# Patient Record
Sex: Female | Born: 1975 | Race: White | Hispanic: No | Smoking: Former smoker
Health system: Southern US, Community
[De-identification: ages and names within clinical notes are randomized; demographics above are authoritative.]

## PROBLEM LIST (undated history)

## (undated) DIAGNOSIS — R519 Headache, unspecified: Secondary | ICD-10-CM

## (undated) DIAGNOSIS — I10 Essential (primary) hypertension: Secondary | ICD-10-CM

## (undated) DIAGNOSIS — R5383 Other fatigue: Secondary | ICD-10-CM

## (undated) DIAGNOSIS — E559 Vitamin D deficiency, unspecified: Secondary | ICD-10-CM

## (undated) DIAGNOSIS — F319 Bipolar disorder, unspecified: Secondary | ICD-10-CM

## (undated) DIAGNOSIS — G4730 Sleep apnea, unspecified: Secondary | ICD-10-CM

## (undated) DIAGNOSIS — F32A Depression, unspecified: Secondary | ICD-10-CM

## (undated) DIAGNOSIS — M549 Dorsalgia, unspecified: Secondary | ICD-10-CM

## (undated) DIAGNOSIS — F329 Major depressive disorder, single episode, unspecified: Secondary | ICD-10-CM

## (undated) DIAGNOSIS — K5900 Constipation, unspecified: Secondary | ICD-10-CM

## (undated) DIAGNOSIS — D649 Anemia, unspecified: Secondary | ICD-10-CM

## (undated) DIAGNOSIS — E119 Type 2 diabetes mellitus without complications: Secondary | ICD-10-CM

## (undated) DIAGNOSIS — F4310 Post-traumatic stress disorder, unspecified: Secondary | ICD-10-CM

## (undated) DIAGNOSIS — K589 Irritable bowel syndrome without diarrhea: Secondary | ICD-10-CM

## (undated) DIAGNOSIS — M2550 Pain in unspecified joint: Secondary | ICD-10-CM

## (undated) DIAGNOSIS — M797 Fibromyalgia: Secondary | ICD-10-CM

## (undated) DIAGNOSIS — E282 Polycystic ovarian syndrome: Secondary | ICD-10-CM

## (undated) DIAGNOSIS — R0602 Shortness of breath: Secondary | ICD-10-CM

## (undated) DIAGNOSIS — M1990 Unspecified osteoarthritis, unspecified site: Secondary | ICD-10-CM

## (undated) DIAGNOSIS — F419 Anxiety disorder, unspecified: Secondary | ICD-10-CM

## (undated) DIAGNOSIS — K760 Fatty (change of) liver, not elsewhere classified: Secondary | ICD-10-CM

## (undated) DIAGNOSIS — F909 Attention-deficit hyperactivity disorder, unspecified type: Secondary | ICD-10-CM

## (undated) DIAGNOSIS — K219 Gastro-esophageal reflux disease without esophagitis: Secondary | ICD-10-CM

## (undated) DIAGNOSIS — R51 Headache: Secondary | ICD-10-CM

## (undated) HISTORY — DX: Anxiety disorder, unspecified: F41.9

## (undated) HISTORY — DX: Depression, unspecified: F32.A

## (undated) HISTORY — DX: Post-traumatic stress disorder, unspecified: F43.10

## (undated) HISTORY — DX: Polycystic ovarian syndrome: E28.2

## (undated) HISTORY — PX: WISDOM TOOTH EXTRACTION: SHX21

## (undated) HISTORY — DX: Vitamin D deficiency, unspecified: E55.9

## (undated) HISTORY — PX: CARPAL TUNNEL RELEASE: SHX101

## (undated) HISTORY — DX: Constipation, unspecified: K59.00

## (undated) HISTORY — DX: Attention-deficit hyperactivity disorder, unspecified type: F90.9

## (undated) HISTORY — DX: Headache, unspecified: R51.9

## (undated) HISTORY — DX: Major depressive disorder, single episode, unspecified: F32.9

## (undated) HISTORY — DX: Headache: R51

## (undated) HISTORY — DX: Irritable bowel syndrome, unspecified: K58.9

## (undated) HISTORY — PX: OTHER SURGICAL HISTORY: SHX169

## (undated) HISTORY — PX: ABDOMINAL HYSTERECTOMY: SHX81

## (undated) HISTORY — DX: Other fatigue: R53.83

## (undated) HISTORY — DX: Dorsalgia, unspecified: M54.9

## (undated) HISTORY — DX: Pain in unspecified joint: M25.50

## (undated) HISTORY — DX: Gastro-esophageal reflux disease without esophagitis: K21.9

## (undated) HISTORY — DX: Fatty (change of) liver, not elsewhere classified: K76.0

## (undated) HISTORY — DX: Shortness of breath: R06.02

## (undated) HISTORY — DX: Bipolar disorder, unspecified: F31.9

## (undated) HISTORY — PX: APPENDECTOMY: SHX54

## (undated) HISTORY — DX: Type 2 diabetes mellitus without complications: E11.9

---

## 2000-05-16 ENCOUNTER — Emergency Department (HOSPITAL_COMMUNITY): Admission: EM | Admit: 2000-05-16 | Discharge: 2000-05-16 | Payer: Self-pay | Admitting: Emergency Medicine

## 2000-05-24 ENCOUNTER — Inpatient Hospital Stay (HOSPITAL_COMMUNITY): Admission: AD | Admit: 2000-05-24 | Discharge: 2000-05-24 | Payer: Self-pay | Admitting: Obstetrics

## 2000-08-23 ENCOUNTER — Ambulatory Visit (HOSPITAL_COMMUNITY): Admission: RE | Admit: 2000-08-23 | Discharge: 2000-08-23 | Payer: Self-pay | Admitting: *Deleted

## 2000-08-23 ENCOUNTER — Encounter: Payer: Self-pay | Admitting: *Deleted

## 2000-08-27 ENCOUNTER — Inpatient Hospital Stay (HOSPITAL_COMMUNITY): Admission: AD | Admit: 2000-08-27 | Discharge: 2000-08-27 | Payer: Self-pay | Admitting: *Deleted

## 2000-11-12 ENCOUNTER — Inpatient Hospital Stay (HOSPITAL_COMMUNITY): Admission: AD | Admit: 2000-11-12 | Discharge: 2000-11-12 | Payer: Self-pay | Admitting: *Deleted

## 2000-12-23 ENCOUNTER — Encounter (INDEPENDENT_AMBULATORY_CARE_PROVIDER_SITE_OTHER): Payer: Self-pay | Admitting: Specialist

## 2000-12-23 ENCOUNTER — Inpatient Hospital Stay (HOSPITAL_COMMUNITY): Admission: AD | Admit: 2000-12-23 | Discharge: 2000-12-27 | Payer: Self-pay | Admitting: *Deleted

## 2001-10-11 ENCOUNTER — Encounter (INDEPENDENT_AMBULATORY_CARE_PROVIDER_SITE_OTHER): Payer: Self-pay | Admitting: Specialist

## 2001-10-11 ENCOUNTER — Ambulatory Visit (HOSPITAL_COMMUNITY): Admission: RE | Admit: 2001-10-11 | Discharge: 2001-10-11 | Payer: Self-pay

## 2001-10-14 ENCOUNTER — Inpatient Hospital Stay (HOSPITAL_COMMUNITY): Admission: AD | Admit: 2001-10-14 | Discharge: 2001-10-14 | Payer: Self-pay | Admitting: Obstetrics and Gynecology

## 2002-04-23 ENCOUNTER — Encounter: Payer: Self-pay | Admitting: Emergency Medicine

## 2002-04-23 ENCOUNTER — Emergency Department (HOSPITAL_COMMUNITY): Admission: EM | Admit: 2002-04-23 | Discharge: 2002-04-23 | Payer: Self-pay | Admitting: Emergency Medicine

## 2002-07-03 ENCOUNTER — Other Ambulatory Visit: Admission: RE | Admit: 2002-07-03 | Discharge: 2002-07-03 | Payer: Self-pay | Admitting: Obstetrics and Gynecology

## 2002-07-16 ENCOUNTER — Emergency Department (HOSPITAL_COMMUNITY): Admission: EM | Admit: 2002-07-16 | Discharge: 2002-07-16 | Payer: Self-pay | Admitting: *Deleted

## 2002-07-16 ENCOUNTER — Encounter: Payer: Self-pay | Admitting: *Deleted

## 2002-08-05 ENCOUNTER — Emergency Department (HOSPITAL_COMMUNITY): Admission: EM | Admit: 2002-08-05 | Discharge: 2002-08-05 | Payer: Self-pay | Admitting: Internal Medicine

## 2002-09-17 ENCOUNTER — Emergency Department (HOSPITAL_COMMUNITY): Admission: EM | Admit: 2002-09-17 | Discharge: 2002-09-17 | Payer: Self-pay

## 2002-09-29 ENCOUNTER — Emergency Department (HOSPITAL_COMMUNITY): Admission: EM | Admit: 2002-09-29 | Discharge: 2002-09-29 | Payer: Self-pay | Admitting: *Deleted

## 2003-12-26 ENCOUNTER — Ambulatory Visit (HOSPITAL_COMMUNITY): Admission: RE | Admit: 2003-12-26 | Discharge: 2003-12-26 | Payer: Self-pay | Admitting: Obstetrics and Gynecology

## 2004-10-27 ENCOUNTER — Emergency Department (HOSPITAL_COMMUNITY): Admission: EM | Admit: 2004-10-27 | Discharge: 2004-10-27 | Payer: Self-pay | Admitting: Emergency Medicine

## 2005-01-20 ENCOUNTER — Ambulatory Visit: Payer: Self-pay | Admitting: Orthopedic Surgery

## 2005-01-28 ENCOUNTER — Ambulatory Visit (HOSPITAL_COMMUNITY): Admission: RE | Admit: 2005-01-28 | Discharge: 2005-01-28 | Payer: Self-pay | Admitting: Orthopedic Surgery

## 2005-02-04 ENCOUNTER — Ambulatory Visit: Payer: Self-pay | Admitting: Orthopedic Surgery

## 2005-02-16 ENCOUNTER — Encounter: Admission: RE | Admit: 2005-02-16 | Discharge: 2005-02-16 | Payer: Self-pay | Admitting: Orthopedic Surgery

## 2005-02-17 ENCOUNTER — Emergency Department (HOSPITAL_COMMUNITY): Admission: EM | Admit: 2005-02-17 | Discharge: 2005-02-18 | Payer: Self-pay | Admitting: Emergency Medicine

## 2005-02-19 ENCOUNTER — Inpatient Hospital Stay (HOSPITAL_COMMUNITY): Admission: EM | Admit: 2005-02-19 | Discharge: 2005-02-23 | Payer: Self-pay | Admitting: Emergency Medicine

## 2005-03-03 ENCOUNTER — Ambulatory Visit: Payer: Self-pay | Admitting: Orthopedic Surgery

## 2005-03-29 ENCOUNTER — Ambulatory Visit: Payer: Self-pay | Admitting: Orthopedic Surgery

## 2005-04-26 ENCOUNTER — Ambulatory Visit: Payer: Self-pay | Admitting: Orthopedic Surgery

## 2005-04-30 ENCOUNTER — Ambulatory Visit (HOSPITAL_COMMUNITY): Admission: RE | Admit: 2005-04-30 | Discharge: 2005-04-30 | Payer: Self-pay | Admitting: Orthopedic Surgery

## 2005-04-30 ENCOUNTER — Ambulatory Visit: Payer: Self-pay | Admitting: Orthopedic Surgery

## 2005-05-03 ENCOUNTER — Ambulatory Visit: Payer: Self-pay | Admitting: Orthopedic Surgery

## 2005-05-05 ENCOUNTER — Ambulatory Visit (HOSPITAL_COMMUNITY): Admission: RE | Admit: 2005-05-05 | Discharge: 2005-05-05 | Payer: Self-pay | Admitting: Orthopedic Surgery

## 2005-05-10 ENCOUNTER — Ambulatory Visit: Payer: Self-pay | Admitting: Orthopedic Surgery

## 2005-05-31 ENCOUNTER — Ambulatory Visit: Payer: Self-pay | Admitting: Orthopedic Surgery

## 2005-07-01 ENCOUNTER — Ambulatory Visit: Payer: Self-pay | Admitting: Orthopedic Surgery

## 2005-09-17 ENCOUNTER — Ambulatory Visit (HOSPITAL_COMMUNITY): Admission: RE | Admit: 2005-09-17 | Discharge: 2005-09-17 | Payer: Self-pay | Admitting: Family Medicine

## 2005-09-23 ENCOUNTER — Other Ambulatory Visit: Admission: RE | Admit: 2005-09-23 | Discharge: 2005-09-23 | Payer: Self-pay | Admitting: Family Medicine

## 2005-10-11 ENCOUNTER — Ambulatory Visit: Payer: Self-pay | Admitting: Orthopedic Surgery

## 2005-12-07 ENCOUNTER — Ambulatory Visit: Payer: Self-pay | Admitting: Orthopedic Surgery

## 2005-12-07 ENCOUNTER — Encounter: Payer: Self-pay | Admitting: Orthopedic Surgery

## 2005-12-07 ENCOUNTER — Ambulatory Visit (HOSPITAL_COMMUNITY): Admission: RE | Admit: 2005-12-07 | Discharge: 2005-12-07 | Payer: Self-pay | Admitting: Orthopedic Surgery

## 2005-12-13 ENCOUNTER — Ambulatory Visit: Payer: Self-pay | Admitting: Orthopedic Surgery

## 2005-12-22 ENCOUNTER — Ambulatory Visit: Payer: Self-pay | Admitting: Orthopedic Surgery

## 2006-01-12 ENCOUNTER — Ambulatory Visit: Payer: Self-pay | Admitting: Orthopedic Surgery

## 2006-03-23 ENCOUNTER — Emergency Department (HOSPITAL_COMMUNITY): Admission: EM | Admit: 2006-03-23 | Discharge: 2006-03-23 | Payer: Self-pay | Admitting: Emergency Medicine

## 2006-11-24 ENCOUNTER — Ambulatory Visit (HOSPITAL_COMMUNITY): Admission: RE | Admit: 2006-11-24 | Discharge: 2006-11-24 | Payer: Self-pay | Admitting: Obstetrics

## 2006-12-25 ENCOUNTER — Emergency Department (HOSPITAL_COMMUNITY): Admission: EM | Admit: 2006-12-25 | Discharge: 2006-12-25 | Payer: Self-pay | Admitting: Emergency Medicine

## 2007-04-27 ENCOUNTER — Ambulatory Visit: Payer: Self-pay | Admitting: Orthopedic Surgery

## 2008-01-02 ENCOUNTER — Emergency Department (HOSPITAL_COMMUNITY): Admission: EM | Admit: 2008-01-02 | Discharge: 2008-01-02 | Payer: Self-pay | Admitting: Emergency Medicine

## 2008-01-09 ENCOUNTER — Emergency Department (HOSPITAL_COMMUNITY): Admission: EM | Admit: 2008-01-09 | Discharge: 2008-01-09 | Payer: Self-pay | Admitting: Emergency Medicine

## 2008-02-01 ENCOUNTER — Ambulatory Visit (HOSPITAL_COMMUNITY): Admission: RE | Admit: 2008-02-01 | Discharge: 2008-02-01 | Payer: Self-pay | Admitting: Obstetrics and Gynecology

## 2008-02-26 ENCOUNTER — Encounter: Payer: Self-pay | Admitting: Obstetrics and Gynecology

## 2008-02-26 ENCOUNTER — Inpatient Hospital Stay (HOSPITAL_COMMUNITY): Admission: RE | Admit: 2008-02-26 | Discharge: 2008-02-28 | Payer: Self-pay | Admitting: Obstetrics and Gynecology

## 2008-03-02 ENCOUNTER — Emergency Department (HOSPITAL_COMMUNITY): Admission: EM | Admit: 2008-03-02 | Discharge: 2008-03-02 | Payer: Self-pay | Admitting: Emergency Medicine

## 2008-03-03 ENCOUNTER — Emergency Department (HOSPITAL_COMMUNITY): Admission: EM | Admit: 2008-03-03 | Discharge: 2008-03-03 | Payer: Self-pay | Admitting: Emergency Medicine

## 2008-06-09 ENCOUNTER — Emergency Department (HOSPITAL_COMMUNITY): Admission: EM | Admit: 2008-06-09 | Discharge: 2008-06-09 | Payer: Self-pay | Admitting: Emergency Medicine

## 2008-09-12 ENCOUNTER — Ambulatory Visit (HOSPITAL_COMMUNITY): Admission: RE | Admit: 2008-09-12 | Discharge: 2008-09-12 | Payer: Self-pay | Admitting: Family Medicine

## 2008-11-13 ENCOUNTER — Ambulatory Visit (HOSPITAL_COMMUNITY): Admission: RE | Admit: 2008-11-13 | Discharge: 2008-11-13 | Payer: Self-pay | Admitting: Neurology

## 2009-02-22 ENCOUNTER — Encounter: Payer: Self-pay | Admitting: Orthopedic Surgery

## 2009-02-22 ENCOUNTER — Emergency Department (HOSPITAL_COMMUNITY): Admission: EM | Admit: 2009-02-22 | Discharge: 2009-02-22 | Payer: Self-pay | Admitting: Emergency Medicine

## 2009-03-03 ENCOUNTER — Ambulatory Visit: Payer: Self-pay | Admitting: Orthopedic Surgery

## 2009-03-03 DIAGNOSIS — M25569 Pain in unspecified knee: Secondary | ICD-10-CM | POA: Insufficient documentation

## 2009-03-05 ENCOUNTER — Encounter: Payer: Self-pay | Admitting: Orthopedic Surgery

## 2009-06-06 ENCOUNTER — Emergency Department (HOSPITAL_COMMUNITY): Admission: EM | Admit: 2009-06-06 | Discharge: 2009-06-06 | Payer: Self-pay | Admitting: Emergency Medicine

## 2009-10-08 ENCOUNTER — Emergency Department (HOSPITAL_COMMUNITY): Admission: EM | Admit: 2009-10-08 | Discharge: 2009-10-08 | Payer: Self-pay | Admitting: Emergency Medicine

## 2009-10-10 ENCOUNTER — Emergency Department (HOSPITAL_COMMUNITY): Admission: EM | Admit: 2009-10-10 | Discharge: 2009-10-10 | Payer: Self-pay | Admitting: Emergency Medicine

## 2009-10-15 ENCOUNTER — Emergency Department (HOSPITAL_COMMUNITY): Admission: EM | Admit: 2009-10-15 | Discharge: 2009-10-15 | Payer: Self-pay | Admitting: Emergency Medicine

## 2009-12-18 ENCOUNTER — Encounter: Payer: Self-pay | Admitting: Orthopedic Surgery

## 2010-04-04 ENCOUNTER — Emergency Department (HOSPITAL_COMMUNITY): Admission: EM | Admit: 2010-04-04 | Discharge: 2010-04-04 | Payer: Self-pay | Admitting: Emergency Medicine

## 2010-08-12 ENCOUNTER — Emergency Department (HOSPITAL_COMMUNITY): Admission: EM | Admit: 2010-08-12 | Discharge: 2010-08-12 | Payer: Self-pay | Admitting: Emergency Medicine

## 2010-11-29 ENCOUNTER — Encounter: Payer: Self-pay | Admitting: Orthopedic Surgery

## 2010-12-10 NOTE — Letter (Signed)
Summary: *Orthopedic No Show Letter  [facility]  [address]  [place], [place] [address]   Phone: [phone]  Fax: [phone]    12/18/2009   Roger Fas[address]acht [address] [place], [place]  [address]    Dear Ms. Joselyn Glassman,   Our records indicate that you missed your scheduled appointment with Dr. Beaulah Corin on 12/11/2009.  Please contact this office to reschedule your appointment as soon as possible.  It is important that you keep your scheduled appointments with your physician, so we can provide you the best care possible.  We have enclosed an appointment card for your convenience.      Sincerely,    Dr. Terrance Mass, MD [facility] Phone [phone]

## 2011-02-09 LAB — DIFFERENTIAL
Basophils Absolute: 0 10*3/uL (ref 0.0–0.1)
Basophils Relative: 0 % (ref 0–1)
Eosinophils Absolute: 0.7 10*3/uL (ref 0.0–0.7)
Lymphs Abs: 3.5 10*3/uL (ref 0.7–4.0)
Neutrophils Relative %: 36 % — ABNORMAL LOW (ref 43–77)

## 2011-02-09 LAB — BASIC METABOLIC PANEL
BUN: 9 mg/dL (ref 6–23)
Chloride: 107 mEq/L (ref 96–112)
Creatinine, Ser: 0.8 mg/dL (ref 0.4–1.2)

## 2011-02-09 LAB — CBC
MCHC: 34.5 g/dL (ref 30.0–36.0)
MCV: 87.8 fL (ref 78.0–100.0)
Platelets: 170 10*3/uL (ref 150–400)
RDW: 13.4 % (ref 11.5–15.5)
WBC: 7.7 10*3/uL (ref 4.0–10.5)

## 2011-02-09 LAB — URINALYSIS, ROUTINE W REFLEX MICROSCOPIC
Ketones, ur: NEGATIVE mg/dL
Leukocytes, UA: NEGATIVE
Nitrite: NEGATIVE

## 2011-02-09 LAB — URINE MICROSCOPIC-ADD ON

## 2011-02-22 LAB — CSF CELL COUNT WITH DIFFERENTIAL: WBC, CSF: 6 /mm3 — ABNORMAL HIGH (ref 0–5)

## 2011-02-22 LAB — PATHOLOGIST SMEAR REVIEW

## 2011-03-23 NOTE — Op Note (Signed)
NAME:  Robin Bender, Robin Bender NO.:  [id number]   MEDICAL RECORD NO.:  [id number]          PATIENT TYPE:  INP   LOCATION:  A321                          FACILITY:  [facility]   PHYSICIAN:  Tilda Burrow, M.D. DATE OF BIRTH:  1976/01/09   DATE OF PROCEDURE:  02/26/2008  DATE OF DISCHARGE:                               OPERATIVE REPORT   PREOPERATIVE DIAGNOSES:  1. Abdominal wall endometriosis (mass).  2. Dysmenorrhea.  3. Pelvic pain.   POSTOPERATIVE DIAGNOSES:  1. Abdominal wall endometriosis (mass).  2. Dysmenorrhea.  3. Pelvic pain.   PROCEDURES:  1..  Abdominal hysterectomy with bilateral salpingo-  oophorectomy.  1. Excision of fibrotic area and anterior abdominal wall (rectus      muscle).   SURGEON:  Tilda Burrow, M.D.   ASSISTANT:  Morrie Sheldon R.N.   ANESTHESIA:  General.   COMPLICATIONS:  None.   FINDINGS:  Dense fibrotic tissue from fascia to the surface of the right  rectus muscle and into the midline, approximately 4 cm in maximum  diameter involving the muscle itself to some degree and the midline  structures, felt to represent old endometriosis.  Anteflexed,  upper  limits, normal-sized uterus, and had diffuse vascularity throughout the  pelvis.  Bladder densely adherent to old C-section scar.   DETAILS OF PROCEDURE:  The patient was taken to the operating room,  prepped and draped in the usual fashion for a lower abdominal surgery.  The Pfannenstiel incision was repeated.  The dense fibrotic area in  question was somewhat cephalad to the incision, and so it was felt  prudent to remove a portion of the subcu fatty tissue and skin overlying  the upper portion of the old scar in order to improve access to the  fibrotic area.  An approximately 10-cm wide x 40-cm long ellipse of skin  and fatty tissue was removed.  A Pfannenstiel incision was then made  cutting down over the firm fibrotic tissue with a semicircular contour  to the fascial incision  allowing the fascia to be flapped upward.  The  dense fibrotic tissue could be identified.  The midline was identified  above the area of fibrosis, and the peritoneal cavity entered higher  than the fibrotic area and, then with the operator's left index finger  on the anterior abdominal wall, we were able to identify the limits of  the firm fibrotic area.  Fibrosis extended well into the rectus muscles  on the right side and required a shaving procedure to core out  approximately 4-cm wide x 5-cm vertical x 2-cm deep area of fibrotic  tissue getting down to relatively normal tissue surrounding it.  Some of  this tissue was left on the right rectus muscles during the entry  process.  Bladder flap was found to be well out of the way.  There were  some omental attachments to the backside of the midline where the  fibrosis was most attached to the midline.  This could be sharply  dissected free and the omentum checked and confirmed as hemostatic.  Bladder flap was well away  from the inferior aspects of the fibrosis.  Balfour retractor could be positioned, lateral retraction performed, and  the uterus inspected.  Two laparotomy tapes were placed in the abdomen,  moistened, and used to expose the pelvis by elevating abdominal  contents.  The uterus could be grasped with Lahey thyroid tenaculum at  its fundal area, and round ligaments taken down, clamped, cut, and  suture ligated bilaterally.  The bladder flap was very high on the lower  uterine segment and densely adherent.  There was some redundancy that  was loosely adherent even further upon the uterus.  This portion was  taken down and the dense adhesions worked around carefully.  We first  took down the utero-ovarian ligaments, fallopian tube and as a pedicle  clamping, cutting, and suture ligating.  Uterine vessels were then  clamped, cut, and suture ligated using curved Heaney clamps, Kelly  clamps for back bleeding, and #0 chromic suture  ligature.  At this time,  we were able to work around and develop a bladder flap using a right-  angle clamp to dissect below the most fibrotic area on the anterior  uterine segment and then using a combination of Bovie cautery and  Metzenbaum scissors to cut the bladder dome free from the uterus being  careful to cut in the surface of the uterus rather than risk bladder  disruption.  The bladder was inspected at the completion of the surgery  and there was no suspicion for penetration of the bladder.   The procedure then continued with clamping and cutting the upper  cardinal ligaments with straight Heaney clamps, knife dissection, and #0  chromic suture ligature.  The uterus was then amputated off the lower  uterine segment.  We then continued to march down the lower cardinal  ligaments reaching the level of the cervix.  A stab incision could be  made in the anterior cervical vaginal fornix.  The first attempt was  through the cervix itself, and the second attempt allowed [place] to find the  cervical vaginal angle, and amputate the cervix off the very vascular  cuff.  Kocher clamps were placed at 6 spots along the cuff for  hemostasis and orientation.  Lateral sutures using Aldridge stitches  were placed at each lateral vaginal angle.  The remainder of the cuff  was closed with a series of figure-of-eight sutures of #0 chromic and  required several sutures in order to achieve adequate hemostasis.  Pelvis was irrigated and found adequately hemostatic.  The EBL at this  time was approximately 400 mL.   Attention was then directed to the bladder flap, inspected, and  hemostasis again confirmed.  Removal of tubes and ovaries was then  performed.  The left side was performed first, and the very mobile tube  and ovary could be placed on traction, cross-clamped just beneath the  ovary, and specimen removed without difficulty, ureter was well out of  harm's way.  On the right side, the ureter  could easily be seen  peristalsing beneath the lateral sidewall far away from the surgical  field, and then cross-clamping was performed, and a good results  obtained with removal of the tube and ovary suturing of the pedicle.  As  we further irrigated and inspected, the suture on this pedicle was  partially dislodged and required right-angle clamping across the  pedicle.  Again, the ureter was confirmed as being well out of harm's  way, and then a suture ligation performed once again on  this pedicle  with adequate hemostasis.  Pelvis was irrigated again.  Hemostasis  considered adequate and attention directed to closure.   Laparotomy equipment was removed.  Sponge and needle counts were  correct.  Peritoneal surface was closed with a running 2-0 chromic.  The  right rectus muscle required excision of the last thumb-sized portion of  fibrotic tissue from the anterior rectus muscle wall, and then the  rectus muscles were loosely reapproximated to the midline using loose  sutures of 2-0 chromic.  Fascia was closed using 2 segments of #0 Vicryl  to pull the fascia back into place.  The subcu fatty tissue was  recontoured sufficiently to allow tissue edge approximation.  The  inferior margins were loosened and the extra fatty tissue on the upper  side trimmed to allow for good skin edge approximation.  Subcu fatty  tissue was irrigated copiously.  It should be noted that prior to  closure of the fascia, a midline subfascial drain was placed and allowed  to exit through a suprapubic stab incision site.   Similarly, once the subcu fatty tissues had been reapproximated using 2  layers of interrupted 2-0 plain sutures, we were then able to place a  subcutaneous flat 10-mm Jackson-Pratt drain, which was allowed to exit  through the left corner of the incision.  Staples closed the skin.  Hemostasis at this time was good with minimal bleeding from the drains  at this time with completion of the  procedure.  The patient's urine  output during the case had been relatively low at 100 mL, but picked up  at the completion of the case.  Again, at no time were we suspicious for  bladder disruption, and ureters were well out of the area where the  tubes and ovaries were removed.  The patient went to recovery room in  stable condition with sponge and needle counts correct with relatively  impressive discomfort levels, which will be treated with a Dilaudid PCA  pump.      Tilda Burrow, M.D.  Electronically Signed     JVF/MEDQ  D:  02/26/2008  T:  02/27/2008  Job:  [id number]

## 2011-03-23 NOTE — H&P (Signed)
NAME:  Robin Bender, ARRAS NO.:  [id number]   MEDICAL RECORD NO.:  [id number]          PATIENT TYPE:  AMB   LOCATION:  DAY                           FACILITY:  [facility]   PHYSICIAN:  Tilda Burrow, M.D. DATE OF BIRTH:  10-17-1976   DATE OF ADMISSION:  DATE OF DISCHARGE:  LH                              HISTORY & PHYSICAL   ADMITTING DIAGNOSIS:  Endometriosis of abdominal wall, dysmenorrhea,  schedule for a hysterectomy with removal of tubes and ovaries, and  excision of abdominally wall nodular thickening.   HISTORY OF PRESENT ILLNESS:  This 35 year old female status post C-  section x2, status post tubal ligation, and status post removal of  ovarian cyst, is admitted at this time for abdominal hysterectomy with  removal of tubes and ovaries.  Her most recent history has been notable  for progressive increase in abdominal pain essentially with each menses  where she hurts severely just above her old C-section scar.  She has  been seen for this in the emergency room .  Evaluation there describes  the pain as in the mid and lower abdomen.  CT of the abdomen has been  performed.  There is an unremarkable appearance to the tubes and  ovaries.  There is a normal appendix.  There are no intrapelvic masses  or adenopathy.  There is unfortunately nodular thickening of the  inferior rectus sheath 5 cm below the umbilicus, which is in the area  where her pain complaints predominate.  These pains are associated with  each menses, pain with walking.  She is bedridden with her periods due  to the severity of the pain, especially the belly wall.  The pain does  extend into the hips and into the back raising suspension that there is  endometriosis in the abdominal cavity as well.  She has no fever.  The  pain is associated with any movement.  She is a gravida 2, para 2,  status post tubal ligation, Cesarean section x2 and ovarian cyst  removal.   She sees no point in trying to do  conservative surgery to preserve the  uterus as she has no plans to use it.  She is in a stable marital  relationship and partner is with her for this discussion.   Plans are to proceed with abdominal hysterectomy with exploration of the  facial layer at the level of the rectus muscles, try to remove any  fibrotic tissue identified.   PHYSICAL EXAMINATION:  Height 5 feet 6 inches.  Weight 225 pounds.  Blood pressure 138/82.  Pupils equal, rebound, reactive.  Extraocular movements intact.  NECK:  Supple.  CHEST:  Clear.  BREAST:  Exam not performed.  ABDOMEN:  Shows well healed lower abdominal Pfannenstiel  incision with  thickening and hard tissue above it, which is moderately tender to  palpation.  External genitalia is normal.  Pap smear is reported by  patient as normal from previous physician.  Uterus is mobile.  Adnexa is  without masses.   IMPRESSION:  Endometriosis of abdominal wall, dysmenorrhea.   PLAN:  Abdominal hysterectomy with removal of tubes and ovaries on February 26, 2008.      Tilda Burrow, M.D.  Electronically Signed     JVF/MEDQ  D:  02/22/2008  T:  02/22/2008  Job:  [id number]

## 2011-03-26 NOTE — Consult Note (Signed)
NAME:  Robin Bender, Robin Bender             ACCOUNT NO.:  [id number]   MEDICAL RECORD NO.:  [id number]          PATIENT TYPE:  INP   LOCATION:  A323                          FACILITY:  [facility]   PHYSICIAN:  Kofi A. Gerilyn Pilgrim, M.D. DATE OF BIRTH:  [birth year]   DATE OF CONSULTATION:  02/22/2005  DATE OF DISCHARGE:                                   CONSULTATION   IMPRESSION:  The patient's clinical headache syndrome is most consistent  with a low-pressure post lumbar tap headache.   RECOMMENDATIONS:  1.  Continue with conservative treatment as is being done at this time.  2.  Caffeine could be very effective, and, therefore, I would suggest using      Fioricet in the daytime p.r.n.  3.  I would avoid epidural injection at the cervical spine level as the risk      of complications is greater.  4.  Continue lots of fluids.  5.  IV DHE can also be efficacious for this type of low-pressure headaches      sometimes.   HISTORY:  This is a 35 year old Caucasian female who has history of neck  pain due to bulging disk.  The patient is being seen by Dr. Fuller Canada  and referred to Dr. Barrington Ellison for an interlaminar epidural injection at C7-T1.  The procedure was aborted after a tap was obtained indicating penetration of  subarachnoid space.  The patient apparently developed pain in the hand  during the procedure, but this abruptly resolved once the needle was  removed.  Apparently no vascular uptake was noted under fluoroscopy.  The  patient was discharged home and apparently did well.  However, at home she  sat up and noted she had a severe headache in the posterior neck and head  region.  No fever was reported.  The patient does not have a baseline  history of headache.   She was admitted to the hospital for evaluation.  Images of the cervical  spine and neck with CT have been unremarkable.  The patient has been given  medications which apparently seem to be helping, and headaches have improved  significantly, although she still gets a mild headache on sitting up or  standing.  She is currently getting Dilaudid p.r.n.   PAST MEDICAL HISTORY:  Mostly unremarkable.  She has history of obesity.  She has had two cesarean sections,  tubal ligation, and ovarian cyst removal  in 2003.   FAMILY HISTORY:  Significant for heart problems.   SOCIAL HISTORY:  She lives with her boyfriend and two children.  She smokes  one-half pack of cigarettes a day and reports no alcohol or illicit drug  use.  She currently does not work.   PHYSICAL EXAMINATION:  GENERAL:  Obese lady in no acute distress.  VITAL SIGNS:  Temperature 98.1, pulse 71, respirations 20, blood pressure  142/83.  NECK:  Supple.  MENTATION: She is awake, alert.  She converses well. Speech, language,  cognition are intact.  CRANIAL NERVE: Pupils are 5 mm and briskly reactive.  Extraocular movements  are intact.  Visual fields were intact.  Funduscopic examination shows flat  disks with spontaneous venous pulsations noted.  Facial muscles are  symmetric.  Tongue is midline.  Uvula is midline.  Shoulder shrugs were  normal.  MOTOR EXAMINATION:  Normal tone, bulk, and strength.  There is no pronator  drift.  Coordination intact.  Reflexes are +2.  Plantar reflexes are both  downgoing.  Sensory examination normal to light touch.  Gait is normal.   LABORATORY DATA AND OTHER STUDIES:  CT scan of the brain is normal.   CT scan of the spine shows cervical spine also normal.      KAD/MEDQ  D:  02/22/2005  T:  02/22/2005  Job:  [id number]

## 2011-03-26 NOTE — H&P (Signed)
NAME:  Robin Bender, Robin Bender NO.:  [id number]   MEDICAL RECORD NO.:  [id number]          PATIENT TYPE:  AMB   LOCATION:  DAY                           FACILITY:  [facility]   PHYSICIAN:  Vickki Hearing, M.D.DATE OF BIRTH:  1976/07/19   DATE OF ADMISSION:  DATE OF DISCHARGE:  LH                                HISTORY & PHYSICAL   CHIEF COMPLAINT:  Pain and paresthesias of the left upper extremity.   HISTORY:  This is a 35 year old female presenting with neck and upper  extremity pain and paresthesias for approximately 2 months, no injury, no  previous symptoms of such.  She is noted to have cervical disk problems, had  epidural injection with development of headache and had to be admitted.  She  has a posterior disk bulging at C5-6 and a posterior disk bulging at C6-7.  She also has documented carpal tunnel syndrome, left greater than right, and  wishes to have left carpal tunnel release after failure of nonoperative  treatment.   REVIEW OF SYSTEMS:  History of left knee pain and swelling.  MRI was  obtained to evaluate that as well.   That study is pending.   EXAMINATION:  Normal development, grooming and hygiene.  She is alert and  oriented x3.  Has decreased sensation in the carpal tunnel distribution.  Her pulses are intact and normal.  Skin no lesions.  Musculoskeletal exam  shows positive compression test over the carpal tunnel.  She also has a  positive Phalen's test.  She has decreased sensation in the median  distribution on both upper extremities, left greater than right.  Chest is  clear.  Heart rate and rhythm are normal.  Abdomen is soft.   DATA:  Data include a nerve conduction study indicating carpal tunnel  syndrome.   ASSESSMENT AND PLAN:  The plan is for a carpal tunnel release for left  carpal tunnel syndrome.       SEH/MEDQ  D:  04/29/2005  T:  04/29/2005  Job:  [id number]

## 2011-03-26 NOTE — H&P (Signed)
NAME:  Robin Bender, PRINCIPATO NO.:  [id number]   MEDICAL RECORD NO.:  [id number]          PATIENT TYPE:  AMB   LOCATION:                                FACILITY:  [facility]   PHYSICIAN:  Vickki Hearing, M.D.DATE OF BIRTH:  1976-10-23   DATE OF ADMISSION:  DATE OF DISCHARGE:  LH                                HISTORY & PHYSICAL   CHIEF COMPLAINT:  Pain and paresthesias of the right upper extremity.   HISTORY:  This 35 year old female status post left carpal tunnel release  presents now for right carpal tunnel release.  She does have a posterior  disk bulge at C5-C6 and C6-C7 as documented carpal tunnel syndrome.  She  presents for a right carpal tunnel release after failure of nonoperative  treatment.   The patient understands risks and benefits of the procedure including no  surgery.   REVIEW OF SYSTEMS:  History of left knee pain and swelling; MRI was done to  evaluate that.  She was found to have a small joint effusion and a popliteal  cyst.  No meniscal tears or ligament damage.   EXAMINATION:  Normal development, grooming, and hygiene.  She is alert and  oriented x3.  She has decreased sensation in the right median nerve  distribution.  No skin lesions.  She has a positive compression test over  the right carpal tunnel with a positive Phalen's test.  She has clear chest.  Heart rate and rhythm are normal.  Abdomen is soft.  General appearance was  normal.   DATA:  Positive carpal tunnel nerve conduction study.   PLAN:  Open right carpal tunnel release.      Vickki Hearing, M.D.  Electronically Signed     SEH/MEDQ  D:  12/06/2005  T:  12/06/2005  Job:  [id number]   cc:   [facility]  Fax: [phone]

## 2011-03-26 NOTE — Op Note (Signed)
NAME:  Robin Bender, Robin Bender NO.:  [id number]   MEDICAL RECORD NO.:  [id number]          PATIENT TYPE:  AMB   LOCATION:  DAY                           FACILITY:  [facility]   PHYSICIAN:  Vickki Hearing, M.D.DATE OF BIRTH:  Sep 30, 1976   DATE OF PROCEDURE:  12/07/2005  DATE OF DISCHARGE:                                 OPERATIVE REPORT   This patient had pain and paresthesias of the right upper extremity, had a  nerve conduction study which confirmed clinical diagnosis of carpal tunnel  syndrome, presented for carpal tunnel release of the right upper extremity.   PREOPERATIVE DIAGNOSIS:  Carpal tunnel syndrome, right wrist.   POSTOPERATIVE DIAGNOSIS:  Carpal tunnel syndrome, right wrist.   PROCEDURE:  Open right carpal tunnel release.   FINDINGS:  Compression of median nerve.   SURGEON:  Dr. Romeo Apple.   ANESTHETIC:  Bier block.   The patient's chart was checked prior to surgery. Her right wrist was  countersigned as the surgical site, and the history and physical was  updated. She was given antibiotic, taken to the operating room for Bier  block. After this was established, time-out was taken and completed.   A straight incision was made over the carpal tunnel. Subcutaneous tissue was  divided. Distal aspect of the carpal tunnel was identified. Blunt dissection  was carried out beneath the transverse carpal ligament. The contents of the  carpal tunnel were inspected. There were no other lesions in the carpal  tunnel. Median nerve looked compressed and had an hourglass configuration.   The wound was irrigated and closed with 3-0 nylon suture. We did inject a  combination of 1% plain Marcaine and 1/2% plain Sensorcaine. Sterile  dressing was applied. Tourniquet was released and had good capillary refill.  The patient was taken to the recovery room in stable condition. She was  discharged with 90 Tylox.      Vickki Hearing, M.D.  Electronically  Signed     SEH/MEDQ  D:  12/07/2005  T:  12/07/2005  Job:  [id number]

## 2011-03-26 NOTE — H&P (Signed)
NAME:  Robin Bender, Robin Bender NO.:  [id number]   MEDICAL RECORD NO.:  [id number]                   PATIENT TYPE:  AMB   LOCATION:  DAY                                  FACILITY:  [facility]   PHYSICIAN:  Tilda Burrow, M.D.              DATE OF BIRTH:  03/22/1976   DATE OF ADMISSION:  12/26/2003  DATE OF DISCHARGE:                                HISTORY & PHYSICAL   The patient is scheduled for surgery in [facility] on  December 26, 2003.   HISTORY OF PRESENT ILLNESS:  This 35 year old female, gravida 2, para 2, C  section x 2, last menstrual period December 10, 2003, was admitted at time  for elective permanent sterilization.  She has been seen in our office and  requests permanent sterilization.  She signed appropriate Medicaid  sterilization forms on November 19, 2003.  She understands the permanency of  the requested procedure.  [name] instructional booklet was given to the  patient on November 19, 2003, explaining in detail specific review of Falope  ring application.  Questions encouraged and explained to patient's  satisfaction.  She returns for preoperative assessment and confirmation for  continued desire for procedure on December 25, 2003, with unwavering desire  for sterilization.   PAST MEDICAL HISTORY:  Benign.   PAST SURGICAL HISTORY:  1. Ovarian cystectomy August 11, 2001.  2. Cesarean section x 2.   ALLERGIES:  None known.   PHYSICAL EXAMINATION:  VITAL SIGNS:  Height 5 feet 4 inches.  Weight 230.  Blood pressure 122/70.  GENERAL:  Overweight Caucasian female.  Alert and oriented x 3.  HEENT:  Pupils equal, round, and reactive.  Extraocular movements intact.  NECK:  Supple. Trachea midline.  CHEST:  Clear to auscultation.  ABDOMEN:  Nontender, obese. Well-healed Pfannenstiel incision from three  prior surgeries.  PELVIC:  External genitalia normal.  Vaginal exam: Normal secretions.  Nonpurulent cervix.  Infection from  the bacterial vaginosis noted in January  has resolved.  Uterus nontender.  Adnexa negative for masses.  EXTREMITIES:  Grossly normal.   IMPRESSION:  Elective sterilization.   PLAN:  Laparoscopic tubal sterilization with Falope rings December 26, 2003.     ___________________________________________                                         Tilda Burrow, M.D.   JVF/MEDQ  D:  12/25/2003  T:  12/25/2003  Job:  [id number]

## 2011-03-26 NOTE — H&P (Signed)
[facility]  Patient:    QUETZALY, EBNER                    MRN: [id number] Adm. Date:  [id number] Attending:  Deniece Ree                         History and Physical  HISTORY:                      The patient is a 35 year old primigravida, with EDC of December 25, 2000, was admitted in early labor on 10:50 p.m. on December 23, 2000.  The cervix was 2.0 cm, 70%, with a vertex at -1 to -2. Negative group-B Streptococcus.  Estimated fetal weight was 8 pounds.  The patient was started on Pitocin stimulation, and at 3:25 a.m. on December 24, 2000, an amniotomy was performed.  The fluid was clear.  The tracing was reactive.  Cervix was 2.0 cm, 70%, vertex -1.  She was started on Pitocin, and by 7 a.m. she was contracting regularly every two minutes, of good quality. By 11:40 a.m. she was 4.0 cm dilated, 100%, vertex -1 to -2.  The patient was contracting with quality every two minutes, up to 26 mU of Pitocin.  She made no progress over the next four hours, and it was decided for a C-section, with failure to progress.  PHYSICAL EXAMINATION:  GENERAL:                      A well-developed female in labor.  HEENT:                        Negative.  LUNGS:                        Clear.  HEART:                        A regular rhythm.  No murmurs or gallops.  ABDOMEN:                      Term-sized uterus.  Estimated fetal weight between 8-9 pounds.  PELVIC:                       As described above.  EXTREMITIES:                  Negative. DD:  12/24/00 TD:  12/24/00 Job: [id number]

## 2011-03-26 NOTE — Discharge Summary (Signed)
NAME:  Robin Bender, Robin Bender NO.:  [id number]   MEDICAL RECORD NO.:  [id number]          PATIENT TYPE:  INP   LOCATION:  A323                          FACILITY:  [facility]   PHYSICIAN:  Calvert Cantor, M.D.     DATE OF BIRTH:  [birth year]   DATE OF ADMISSION:  02/19/2005  DATE OF DISCHARGE:  04/18/2006LH                                 DISCHARGE SUMMARY   A patient of [facility].   DISCHARGE DIAGNOSES:  1.  Spinal headache.  2.  Disk bulging at C5-6, C6-7.   DISCHARGE MEDICATIONS:  1.  Fioricet 1-2 tabs q.4h.  2.  Dilaudid 4 mg 1 tab q.4-6h. p.r.n.  3.  Nicotine patch 14 mg daily x14 days, followed by 7 mg daily x14 days.   DISCHARGE INSTRUCTIONS:  1.  Strict bedrest.  2.  Do not smoke while using the patch.  3.  Please insure that you have adequate hydration, up to 8-10 glasses of      water per day.   HOSPITAL COURSE:  This is a 35 year old white female who was admitted status  post an epidural injection in the cervical region for neck pain secondary to  disk bulging.  At that time, the subarachnoid space was penetrated, and the  procedure was then discontinued.  Subsequently, the patient developed a  spinal headache and was admitted to the hospital.  She had a neurology  followup with Dr. Gerilyn Pilgrim for a possible blood patch; however, it has been  decided that she will be discharged home on medications.  While she was in  the hospital, she continued to go downstairs to smoke cigarettes; therefore,  her headache worsened each time she stood or sat up.  It had been explained  to her that she needed strict bedrest and adequate hydration.  She will be  on the above medications for at least 1-2 weeks.  I received a call from her  neurologist, Dr. Barrington Ellison, yesterday, who also agrees that a blood patch is  not necessary, as her epidural space is very narrow; therefore, she will  just receive conservative treatment for now.  She is to follow up with  her  primary care physician if she sees no improvement within a week or two.     SR/MEDQ  D:  02/23/2005  T:  02/23/2005  Job:  [id number]   cc:   [facility]

## 2011-03-26 NOTE — Op Note (Signed)
NAME:  Robin Bender, Robin Bender NO.:  [id number]   MEDICAL RECORD NO.:  [id number]                   PATIENT TYPE:  AMB   LOCATION:  DAY                                  FACILITY:  [facility]   PHYSICIAN:  Tilda Burrow, M.D.              DATE OF BIRTH:  [birth year]   DATE OF PROCEDURE:  12/31/2003  DATE OF DISCHARGE:  12/26/2003                                 OPERATIVE REPORT   PREOPERATIVE DIAGNOSIS:  Elective sterilization.   POSTOPERATIVE DIAGNOSIS:  Elective sterilization.   PROCEDURE:  Laparoscopic tubal sterilization, Falope rings.   SURGEON:  Tilda Burrow, M.D.   ASSISTANT:  None.   ANESTHESIA:  General.   COMPLICATIONS:  None.   ESTIMATED BLOOD LOSS:  Minimal.   FINDINGS:  Anterior omental adhesions to the anterior abdominal wall.   DETAILS OF PROCEDURE:  The patient was taken to the operating room and  prepped and draped in the usual fashion for a combined abdominal and vaginal  procedure with legs in yellow-fin, low lithotomy support. An infraumbilical,  vertical, 1-cm skin incision was made as well as a transverse suprapubic  incision.  The infraumbilical incision was used to access the pelvis using a  Veress needle and water droplet confirmation technique while orienting the  needle towards the pelvis.  Pneumoperitoneum was easily achieved under  normal pressure; and the laparoscopic 5-mm trocar used, under direct  visualization, to enter the abdominal cavity.   The blunt trocar technique was used.  Inspection of the anterior abdominal  wall revealed some anterior omental adhesions which did not prevent access  to the pelvis and did not cause any bowel to be suspicious for injury  related to needle insertion.  We were able to see past it and placed a  suprapubic trocar under direct visualization being careful to stay out of  harms way.   Then we directed our attention to the pelvis where each tube could be  identified to its  fimbriated end, elevated; and, through the suprapubic  site, a Falope ring was placed on the midportion of each tube with  percutaneous 0.25% Marcaine solution used to infiltrate the incarcerated  knuckle of tube and the area below the ring (and the mesosalpinx).  Similar  procedures were performed on each side.  Hemostasis was excellent throughout  the case.  There was no suspected lacerations, bleeding or other  complications.   The patient then was allowed to deflate the abdomen followed by instilling  200 cc of saline into the abdomen to help remove the last of the carbon  dioxide.  Then the laparoscopic trocars were removed. The skin incisions  closed with 3-0 Vicryl and Steri-Strips applied.  The patient tolerated  procedure well and went to recovery room in good condition.      ___________________________________________  Tilda Burrow, M.D.   JVF/MEDQ  D:  01/27/2004  T:  01/27/2004  Job:  [id number]

## 2011-03-26 NOTE — Discharge Summary (Signed)
NAME:  Robin Bender, Robin Bender NO.:  [id number]   MEDICAL RECORD NO.:  [id number]          PATIENT TYPE:  INP   LOCATION:  A321                          FACILITY:  [facility]   PHYSICIAN:  Tilda Burrow, M.D. DATE OF BIRTH:  1976-10-21   DATE OF ADMISSION:  02/26/2008  DATE OF DISCHARGE:  04/22/2009LH                               DISCHARGE SUMMARY   ADMITTING DIAGNOSES:  Endometriosis of abdominal wall and dysmenorrhea.   DISCHARGE DIAGNOSES:  Endometriosis of abdominal wall and dysmenorrhea.   PROCEDURE:  1. Total abdominal hysterectomy.  2. Bilateral salpingo-oophorectomy.  3. Excision of abdominal wall endometriosis.   DISCHARGE MEDICATIONS:  1. Vicodin 5/500 mg 40 tablets 1-2 q. 4h. p.r.n. pain.  2. Vivelle-Dot 0.1 mg patch one to skin twice weekly.  3. Hydrochlorothiazide 25 mg 1 p.o. daily x30 days.   HOSPITAL SUMMARY:  This 35 year old female status post C-section x2,  status post tubal ligation, status post removal of right ovarian cyst  was admitted for abdominal hysterectomy and removal tubes and ovaries  due to progressively increasing abdominal pain associated with menses.  The pain is located severely above her C-section scar.  See HPI for  details.   HOSPITAL COURSE:  The patient underwent abdominal hysterectomy,  bilateral salpingo-oophorectomy, and at admission were dense fibrosis  was identified along the right rectus muscle group requiring extensive  dissection to remove this.  The uterus, tubes, and ovaries were removed  as well with relative ease compared to the surgical excision on the  fibrotic area.   Pathology report showed the presence of endometriosis tissue in the  fibrosis and additionally the uterus was measured as 126 cm with  attached tubes and ovaries.  No pathologic abnormalities of the uterus  were encountered.  Interestingly, there was not any clinically apparent  endometriosis in the pelvis.   Postoperative course is  straightforward, the patient had postoperative  hemoglobin 10 and hematocrit 30 compared to admitting hemoglobin 13 and  hematocrit 39.  White count was within normal limits.  As the patient  had the 2-day  postop course, where she was stable for discharge.  She was followed up  in 5 days.  Postdischarge for abdominal incision check and incisional  drain removal.  Post-routine and postsurgical instructions were reviewed  with the patient.      Tilda Burrow, M.D.  Electronically Signed     JVF/MEDQ  D:  03/19/2008  T:  03/20/2008  Job:  [id number]

## 2011-03-26 NOTE — H&P (Signed)
NAME:  Robin Bender, Robin Bender NO.:  [id number]   MEDICAL RECORD NO.:  [id number]          PATIENT TYPE:  INP   LOCATION:  A323                          FACILITY:  [facility]   PHYSICIAN:  Osvaldo Shipper, MD     DATE OF BIRTH:  May 24, 1976   DATE OF ADMISSION:  02/19/2005  DATE OF DISCHARGE:  LH                                HISTORY & PHYSICAL   PRIMARY CARE PHYSICIAN:  [facility]   She is seeing Dr. Fuller Canada for her neck pain.  She was also seen by  Dr. Antionette Poles for an intralaminar injection.   ADMITTING DIAGNOSES:  1.  Headache, most likely post procedural.  2.  Disk bulging at C5-C6 and C6-C7.   CHIEF COMPLAINT:  Headache since Tuesday.   HISTORY OF PRESENT ILLNESS:  Patient is a 35 year old Caucasian female with  no medical history, but with history of disk bulging in her neck.  She has  been seen by Dr. Fuller Canada in [place].  Patient was referred for  C7-T1 intralaminar injection for her disk bulging.  Patient was to undergo  this procedure with Dr. Barrington Ellison in [place].  Apparently, the procedure was  unsuccessful because the needle was placed in the subarachnoid space.  Subsequently, the procedure was terminated and patient was discharged home  in good condition.   The patient mentioned that ever since that procedure she has been having  pain in the posterior part of her head.  She mentioned that when the needle  was being positioned in her neck she felt pain in both her arms which was  described as a twisting kind of pain.  She mentioned this to the physician  that was performing the procedure at which point he took the needle out and  the pain in both her arms went away but since then patient has been having  pain in the back of her head.  This pain is described as a pressure type  sensation which is worsened with standing or sitting position and relieved  by lying down.  The pain at the worst is about 6-7/10 with  sitting or  standing and is about 1/10 with lying down.  Patient has tried various  medication including hydrocodone, Norflex, and Robaxin with no relief of her  symptoms.  There is no radiation of this pain. Patient denies any visual  problems.  She denies any focal weakness in her arms or her legs.  She  denies any urinary or bowel problems.  She denies any paraesthesias in her  legs or hands; however, she states that she has carpal tunnel and she  sometimes gets tingling and numbness because of that.  Patient states that  at times she has felt lightheaded. Patient denies any head trauma.  She also  mentioned that the pain in her head tends to increase when she coughs or  when she sneezes.  As a result of this pain patient has been unable to do  her household chores.  The patient is also complaining of nausea and has had  a few episodes of vomiting  also since all these symptoms started. No history  of blood in the vomit.   MEDICATIONS:  Patient has tried the following:  Hydrocodone, acetaminophen,  Norflex, and Robaxin.   ALLERGIES:  She is allergic to DEMEROL which causes hives, allergic to  TYLENOL, CODEINE which causes a rash.   PAST MEDICAL HISTORY:  Just significant for obesity, otherwise no other  medical problems.  Patient has had two cesarean sections in the past, a  tubal ligation, as well as ovarian cyst removal in December of 2003.   SOCIAL HISTORY:  Patient lives with her boyfriend and two children.  She  smokes half pack per day and has been smoking for about 10 years.  No  history of any alcohol use.  No history of any illicit drug use.  Currently,  the patient does not work.   FAMILY HISTORY:  Father had some kind of blood disorder which she does not  know of.  Her mother has some heart problem, again unable to describe.  No  other medical problems in her siblings.  One of her grandfathers had a heart  attack.  No history of diabetes, strokes, or hypertension in the  family.   REVIEW OF SYSTEMS:  A 10-point review of system was done which was negative  for any significant findings.   PHYSICAL EXAMINATION:  VITAL SIGNS:  Temperature 97.4, blood pressure  127/72, heart rate 61, respiratory rate 14, saturating 99% on room air.  GENERAL:  Obese white female, very upset because of this headache, otherwise  in no distress.  HEENT:  There is no pallor, no icterus.  Oral mucous membrane is moist with  no oral lesions seen.  Head is atraumatic otherwise.  There is some  tenderness to palpation in the posterior part of the head over the occiput.  NECK:  Soft, supple.  Range of motion is present, though restricted because  of her pain.  LUNGS:  Clear to auscultation bilaterally.  CARDIOVASCULAR:  S1, S2 is normal, regular.  No murmurs present.  No JVD.  No S3, S4.  No bruits heard.  ABDOMEN:  Soft, nontender, nondistended.  Obese.  No organomegaly or mass is  present.  EXTREMITIES:  Without edema.  No joint swelling is noted.  Peripheral pulses  are palpable.  NEUROLOGIC:  Patient is alert, oriented x3.  Cranial nerves II-XII are  intact.  Motor examination 5/5 strength in all muscle groups.  No pronator  drift.  Sensory examination, limited examination was done which was within  normal limits.  Reflexes 2+ in both lower extremities over the patella.  Plantars were equal on both sides.  Upper extremity reflexes were 1+  bilaterally.  Romberg:  Patient tended to fall towards the back and hence  had a positive sign.  Cerebellar signs were all normal.  Gait once again  normal.  Tandem gait was also normal.   LABORATORY DATA:  All the laboratories not available at this time.  However,  there is a CBC which showed white count 9.4, hemoglobin 13.6, platelet count  254.  Differential on the white count is normal.   Imaging studies and MR of the C-spine was done which did not show any acute abnormality.  Full report is pending.   IMPRESSION:  This is a  35 year old white female with no medical problems who  presents with headache following C7-T1 intralaminar injection procedure  which was unsuccessful because the needle went into the subarachnoid space.  The procedure was terminated at  that point.  Patient most likely has a post  lumbar puncture kind of headache but we do need to rule out other etiology  for her new headache including intracranial abnormalities.  Since she  mentioned that the headache increases with cough and sneezing, mass effect  should be under consideration.  The likelihood of infection at this point is  unlikely given no fever and no white count.   PLAN:  We will obtain a noncontrast CAT scan of her head to rule out any  intracranial abnormality.  Will put the patient on bed rest and give her  conservative treatment with opioid analgesics.  Will control her nausea with  Phenergan.  Since patient has headache unresolved for more than 24 hours  after the procedure and if the CAT scan of the head is negative for any  other intracranial abnormality, patient may need further treatment for this  post LP  type of headache.  This could include an epidural blood patch.  We will  obtain a neurologic consult to help [place] with management for this patient.   Further management decision will be based on patient response treatment and  results of initial testing.      GK/MEDQ  D:  02/19/2005  T:  02/19/2005  Job:  [id number]   cc:   Vickki Hearing, M.D.  Fax: [phone]   Dallie Piles, M.D.  [facility] [address], [address]  [place]  [place] [address]  Fax: [phone]

## 2011-03-26 NOTE — Op Note (Signed)
[facility]  Patient:    Robin Bender, Robin Bender I Visit Number: [id number] MRN: [id number]          Service Type: OBS Location: [id number] Attending Physician:  Deniece Ree Proc. Date: 12/24/00 Admit Date:  12/23/2000                             Operative Report  PREOPERATIVE DIAGNOSIS:       Failure to progress.  SURGEON:                      Kathreen Cosier, M.D.  ANESTHESIA:                   Epidural.  DESCRIPTION OF PROCEDURE:     The patient was placed on the operating table in the supine position.  Abdomen prepped and draped.  Bladder emptied with a Foley catheter.  Transverse suprapubic incision made and carried down to the rectus fascia.  The fascia cleanly incised in line with the incision.  Recti muscles retracted laterally.  Peritoneum incised longitudinally.  Transverse incision made in the visceral peritoneum above the bladder and the bladder mobilized inferiorly.  Transverse lower uterine incision made, and the patient delivered from the OP position of a female, Apgars 8 and 9, weighing 8 pounds 2 ounces.  The team was in attendance.  The fluid was clear.  The placenta was posterior and sent to pathology.  The uterine cavity cleaned with dry laps. Uterine incision closed in one layer with continuous suture of #1 chromic, including myometrium and endometrium.  Bladder flap reattached with 2-0 chromic.  Uterus well contracted.  Tubes and ovaries normal.  Abdomen closed in layers, peritoneum with continuous suture of 0 chromic, fascia with continuous suture of O Dexon, and the skin closed with staples.  Blood loss was 600 cc. Attending Physician:  Deniece Ree DD:  12/24/00 TD:  12/25/00 Job: [id number]

## 2011-03-26 NOTE — Op Note (Signed)
NAME:  [name], [name] NO.:  [id number]   MEDICAL RECORD NO.:  [id number]          PATIENT TYPE:  AMB   LOCATION:  DAY                           FACILITY:  [facility]   PHYSICIAN:  Vickki Hearing, M.D.DATE OF BIRTH:  03-15-1976   DATE OF PROCEDURE:  04/30/2005  DATE OF DISCHARGE:                                 OPERATIVE REPORT   HISTORY:  This is a 35 year old female with bilateral carpal tunnel syndrome  documented by nerve conduction studies. Complained of left greater than  right symptoms and presented for carpal tunnel release. Failed conservative  treatment including anti-inflammatories, vitamin B6 and injection.   PREOPERATIVE DIAGNOSIS:  Carpal tunnel syndrome of the left upper extremity.   POSTOPERATIVE DIAGNOSIS:  Carpal tunnel syndrome of the left upper  extremity.   PROCEDURE:  Open left carpal tunnel release.   SURGEON:  Dr. Romeo Apple.   ANESTHETIC:  Bier block.   OPERATIVE FINDINGS:  Compression of the left median nerve with a stenotic  carpal tunnel.   DETAILS OF PROCEDURE:  The patient was identified in preop holding area as  Robin Bender. The left wrist marked for surgery by the patient,  countersigned by the surgeon. She was given preoperative antibiotic, taken  to the operating room after history and physical was updated. After  establishing Bier block, her left upper extremity prepped and draped using  sterile technique. A time-out was taken and completed as required.   A skin incision was made over the left carpal tunnel, divided in the  subcutaneous tissue down to the palmar fascia. This was divided as well into  the transverse carpal ligament. Distal aspect could be identified. Blunt  dissection was carried out beneath the ligament. The transverse carpal  ligament was released. Carpal tunnel contents were inspected, and the median  nerve was found to be compressed from the stenotic canal, mainly from the  transverse carpal  ligament. The wound was irrigated, and the skin was closed  with interrupted 3-0 nylon suture. We injected half percent Marcaine 10 cc  and placed a sterile dressing. Tourniquet was released. Fingertips looked  good with  good capillary refill. The patient was taken recovery room in stable  condition. Discharged on Lorcet 10/650 one every 4 hours p.r.n. for pain,  #60 with 2 refills. Follow-up scheduled for Monday. Doctor will change  dressing. The patient is to keep extremity elevated and move fingers as  tolerated.       SEH/MEDQ  D:  04/30/2005  T:  04/30/2005  Job:  [id number]

## 2011-03-26 NOTE — Op Note (Signed)
[facility]  Patient:    Robin Bender, Robin Bender Visit Number: [id number] MRN: [id number]          Service Type: DSU Location: [facility] Attending Physician:  Wandalee Ferdinand Dictated by:   Rudy Jew Ashley Royalty, M.D. Proc. Date: 10/11/01 Admit Date:  10/11/2001                             Operative Report  PREOPERATIVE DIAGNOSES:       1. Left ovarian cyst.                               2. Left lower quadrant discomfort probably                                  secondary to #1, debilitating.  POSTOPERATIVE DIAGNOSES:      1. Enlarged ovaries bilaterally, left greater                                  than right.                               2. Possible left ovarian cyst.  Pathology                                  pending.  PROCEDURE:                    Diagnostic/operative laparoscopy.  SURGEON:                      Rudy Jew. Ashley Royalty, M.D.  ANESTHESIA:                   General.  ESTIMATED BLOOD LOSS:         Less than 75 cc.  COMPLICATIONS:                None.  PACKS AND DRAINS:             None.  PROCEDURE:                    Patient was taken to the operating room and placed in the dorsal supine position.  After adequate general endotracheal anesthesia was administered she was placed in the lithotomy position and prepped and draped in the usual manner for abdominal and vaginal surgery.  A posterior weighted retractor was placed per vagina.  The anterior lip of the cervix was grasped with a single tooth tenaculum.  Jarco uterine manipulator was placed per cervix.  Bladder was drained with a Red rubber catheter.  Next, a 1.5 cm infraumbilical incision was made in the longitudinal plane.  The regular size 10-11 disposable laparoscopic trocar was placed into the abdominal cavity.  Its location was verified by placement of the laparoscope. Next, three suprapubic 5 mm trocars were placed in the midline, left and right lower quadrants  respectively.  Each one was placed using transillumination and direct visualization techniques.  The pelvis was thoroughly surveyed.  The uterus was normal size, shape, and contour.  There was no evidence of  any endometriosis or fibroids.  The fallopian tubes bilaterally were normal size, contour, and length with luxuriant fimbria.  The right ovary was approximately 4 x 3 x 3 cm.  I could appreciate no distinct cyst on the ovary; however, it did appear to be slightly larger than normal.  The left ovary was approximately 5 x 4 x 4 cm.  It was clearly larger than normal and slightly larger than the left ovary.  A careful survey revealed the possible cyst.  The remainder of the pelvic survey was carried out.  The peritoneal surfaces were smooth and glistening.  The anterior and posterior cul-de-sacs were clean. Pneumoperitoneum was maintained throughout the procedure with CO2.  The left utero-ovarian ligament was grasped.  Using the laser with the GRP4 probe and 15 watts spot size, the apparent cyst was circumscribed on the left ovary.  The tissue was undermined and tissue removed from the left ovary and submitted to pathology for histologic studies.  It did not have a classic appearance of a cyst, but nonetheless, the operator could not be sure and felt the tissue should be removed for diagnosis.  Hemostasis was obtained with the bipolar cautery.  Copious irrigation was accomplished.  Hemostasis was noted. At this point the patient was felt to have benefited maximally from the surgical procedure.  The abdominal instruments were removed, the pneumoperitoneum evacuated.  Fascial defects were closed with 0 Vicryl in an interrupted fashion.  The skin was closed with 3-0 Chromic in a subcuticular fashion with the superior incision.  The inferior incisions were closed with Dermabond.  Vaginal instruments were removed.  A single small cervical laceration on the right aspect of the patients cervix  was easily closed with 3-0 Chromic in a figure-of-eight fashion.  Hemostasis was noted.  Procedure terminated.  Patient was taken to the recovery room in excellent condition. Dictated by:   Rudy Jew Ashley Royalty, M.D. Attending Physician:  Wandalee Ferdinand DD:  10/11/01 TD:  10/11/01 Job: [id number]

## 2011-03-26 NOTE — Discharge Summary (Signed)
[facility]  Patient:    RAPHAELLA, LARKIN Visit Number: [id number] MRN: [id number]          Service Type: OBS Location: [id number] Attending Physician:  Deniece Ree Admit Date:  12/23/2000 Discharge Date: 12/27/2000                             Discharge Summary  DISCHARGE SUMMARY:            The patient is a 35 year old primigravida whose estimated date of confinement was December 23, 2000. The patient was admitted by Dr. Francoise Ceo in my absence. The patient was admitted in early labor; however, she did not make any progress for approximately four hours and at which time a cesarean section was scheduled. The patient underwent a cesarean section from which she tolerated the procedure very well without any problems.  Postoperatively, she did very well without any complications and was discharged on the third postoperative day. She was instructed on the possible complications and care following this type of surgery. She was told to return to may office in four weeks for followup evaluation or to call me prior to that time should any problems arise. Attending Physician:  Deniece Ree DD:  02/01/01 TD:  02/01/01 Job: [id number]

## 2011-03-26 NOTE — H&P (Signed)
[facility]  Patient:    Robin Bender, Robin Bender Visit Number: [id number] MRN: [id number]          Service Type: DSU Location: [facility] Attending Physician:  Wandalee Ferdinand Dictated by:   Rudy Jew Ashley Royalty, M.D. Admit Date:  10/11/2001                           History and Physical  HISTORY OF PRESENT ILLNESS:   This is a 35 year old gravida 1, para 1, who presented July 03, 2001 complaining of lower abdominal discomfort after a C-section.  Subsequent ultrasound revealed a 1.9-cm complex cyst of the left adnexa.  It was otherwise unremarkable.  Patient was seen back approximately eight weeks later and was noted to have continued presence of a left adnexal cyst.  On September 21, 2001, it was noted to be 3.1 cm in greatest diameter. The right adnexa contained no cysts.  The uterus was essentially unremarkable. Patient also complained of left lower quadrant discomfort which was felt probably secondary to the adnexal process.  She is hence for diagnostic/operative laparoscopy.  MEDICATIONS:                  None.  PAST MEDICAL HISTORY:         Medical:                               1. Spinal meningitis.                               2. Abscess on her leg.                               3. Anemia.                                Surgical:  C-section.  ALLERGIES:                    DEMEROL.  FAMILY HISTORY:               Positive for ovarian cancer and hypertension.  SOCIAL HISTORY:               Patient smokes five to six cigarettes per day. She drinks occasional alcohol.  She is separated.  REVIEW OF SYSTEMS:            Noncontributory.  PHYSICAL EXAMINATION:  GENERAL:                      Well-developed, well-nourished, pleasant white female in no acute distress.  Afebrile.  VITAL SIGNS:                  Stable.  SKIN:                         Warm and dry without lesions.  LYMPH:                        There is no supraclavicular,  cervical or inguinal adenopathy.  HEENT:  Normocephalic.  NECK:                         Supple without thyromegaly.  CHEST:                        Lungs are clear.  CARDIAC:                      Regular rate and rhythm without murmurs, gallops or rubs.  BREASTS:                      Exam deferred.  ABDOMEN:                      Soft and nontender, without masses or organomegaly.  Bowel sounds are active.  MUSCULOSKELETAL:              Full range of motion, without edema, cyanosis or CVA tenderness.  PELVIC:                       Examination is deferred until examination under anesthesia.  IMPRESSION:                   1. Left adnexal cyst -- persistent.                               2. Left lower quadrant discomfort -- probably                                  secondary to #1, debilitating.  PLAN:                         Diagnostic/operative laparoscopy with left ovarian cystectomy.  Risks, benefits, complications and alternatives were fully discussed with the patient.  Possible need for unilateral salpingo-oophorectomy discussed and accepted.  Possible need for exploratory laparotomy discussed and accepted.  Questions invited and answered. Dictated by:   Rudy Jew Ashley Royalty, M.D. Attending Physician:  Wandalee Ferdinand DD:  10/11/01 TD:  10/11/01 Job: [id number]

## 2011-03-27 ENCOUNTER — Emergency Department (HOSPITAL_COMMUNITY)
Admission: EM | Admit: 2011-03-27 | Discharge: 2011-03-27 | Disposition: A | Discharge status: Home / Self Care | Payer: Self-pay | Attending: Emergency Medicine | Admitting: Emergency Medicine

## 2011-03-27 ENCOUNTER — Emergency Department (HOSPITAL_COMMUNITY): Payer: Self-pay

## 2011-03-27 DIAGNOSIS — M7660 Achilles tendinitis, unspecified leg: Secondary | ICD-10-CM | POA: Insufficient documentation

## 2011-03-27 DIAGNOSIS — M25579 Pain in unspecified ankle and joints of unspecified foot: Secondary | ICD-10-CM | POA: Insufficient documentation

## 2011-04-14 ENCOUNTER — Emergency Department (HOSPITAL_COMMUNITY)
Admission: EM | Admit: 2011-04-14 | Discharge: 2011-04-14 | Disposition: A | Discharge status: Home / Self Care | Payer: Self-pay | Attending: Emergency Medicine | Admitting: Emergency Medicine

## 2011-04-14 DIAGNOSIS — N390 Urinary tract infection, site not specified: Secondary | ICD-10-CM | POA: Insufficient documentation

## 2011-04-14 DIAGNOSIS — R319 Hematuria, unspecified: Secondary | ICD-10-CM | POA: Insufficient documentation

## 2011-04-14 LAB — URINE MICROSCOPIC-ADD ON

## 2011-04-14 LAB — URINALYSIS, ROUTINE W REFLEX MICROSCOPIC
Bilirubin Urine: NEGATIVE
Glucose, UA: NEGATIVE mg/dL
Ketones, ur: NEGATIVE mg/dL
Specific Gravity, Urine: >1.03 — ABNORMAL HIGH (ref 1.005–1.030)
pH: 6.5 (ref 5.0–8.0)

## 2011-07-30 LAB — CBC
HCT: 42.7
MCV: 87.9
Platelets: 294
RDW: 13.5
WBC: 10

## 2011-07-30 LAB — URINALYSIS, ROUTINE W REFLEX MICROSCOPIC
Hgb urine dipstick: NEGATIVE
Ketones, ur: NEGATIVE
Protein, ur: NEGATIVE
Urobilinogen, UA: 2 — ABNORMAL HIGH

## 2011-07-30 LAB — DIFFERENTIAL
Basophils Absolute: 0.1
Eosinophils Relative: 6 — ABNORMAL HIGH
Lymphocytes Relative: 29
Lymphs Abs: 2.9
Monocytes Absolute: 0.6
Monocytes Relative: 6
Neutro Abs: 5.8

## 2011-07-30 LAB — COMPREHENSIVE METABOLIC PANEL
AST: 17
Albumin: 3.6
BUN: 9
Calcium: 9
Chloride: 107
Creatinine, Ser: 0.67
GFR calc Af Amer: >60
Total Bilirubin: 0.9
Total Protein: 6.5

## 2011-07-30 LAB — WET PREP, GENITAL: Clue Cells Wet Prep HPF POC: NONE SEEN

## 2011-07-30 LAB — GC/CHLAMYDIA PROBE AMP, GENITAL
Chlamydia, DNA Probe: NEGATIVE
GC Probe Amp, Genital: NEGATIVE

## 2011-08-02 LAB — PREGNANCY, URINE: Preg Test, Ur: NEGATIVE

## 2011-08-02 LAB — URINALYSIS, ROUTINE W REFLEX MICROSCOPIC
Bilirubin Urine: NEGATIVE
Ketones, ur: NEGATIVE
Nitrite: NEGATIVE
Protein, ur: NEGATIVE
pH: 7

## 2011-08-03 LAB — CBC
HCT: 29.4 — ABNORMAL LOW
HCT: 37.8
Hemoglobin: 10.4 — ABNORMAL LOW
Hemoglobin: 10.5 — ABNORMAL LOW
Hemoglobin: 13.9
MCHC: 34.5
MCV: 87.6
Platelets: 392
RBC: 3.36 — ABNORMAL LOW
RBC: 3.42 — ABNORMAL LOW
RBC: 4.56
RDW: 13.4
RDW: 13.7
RDW: 14.2
WBC: 12.1 — ABNORMAL HIGH
WBC: 14.8 — ABNORMAL HIGH

## 2011-08-03 LAB — BASIC METABOLIC PANEL
BUN: 12
BUN: 13
CO2: 23
CO2: 26
Chloride: 103
Chloride: 108
Creatinine, Ser: 0.67
Creatinine, Ser: 0.69
GFR calc non Af Amer: >60
Glucose, Bld: 102 — ABNORMAL HIGH
Glucose, Bld: 111 — ABNORMAL HIGH
Potassium: 3.5
Potassium: 3.9

## 2011-08-03 LAB — DIFFERENTIAL
Basophils Absolute: 0
Basophils Absolute: 0
Basophils Relative: 0
Eosinophils Relative: 5
Lymphocytes Relative: 21
Lymphocytes Relative: 21
Lymphs Abs: 2.6
Monocytes Absolute: 0.9
Monocytes Absolute: 1.1 — ABNORMAL HIGH
Monocytes Relative: 8
Monocytes Relative: 9
Neutro Abs: 8.4 — ABNORMAL HIGH
Neutro Abs: 8.8 — ABNORMAL HIGH
Neutrophils Relative %: 67

## 2011-08-03 LAB — CROSSMATCH
ABO/RH(D): O POS
Antibody Screen: NEGATIVE

## 2011-08-03 LAB — ABO/RH: ABO/RH(D): O POS

## 2011-08-03 LAB — URINALYSIS, ROUTINE W REFLEX MICROSCOPIC
Bilirubin Urine: NEGATIVE
Ketones, ur: NEGATIVE
Nitrite: NEGATIVE
Protein, ur: NEGATIVE
pH: 5

## 2012-01-20 ENCOUNTER — Emergency Department (HOSPITAL_COMMUNITY)
Admission: EM | Admit: 2012-01-20 | Discharge: 2012-01-20 | Disposition: A | Discharge status: Home / Self Care | Payer: Self-pay | Attending: Emergency Medicine | Admitting: Emergency Medicine

## 2012-01-20 ENCOUNTER — Encounter (HOSPITAL_COMMUNITY): Payer: Self-pay | Admitting: *Deleted

## 2012-01-20 DIAGNOSIS — J029 Acute pharyngitis, unspecified: Secondary | ICD-10-CM | POA: Insufficient documentation

## 2012-01-20 MED ORDER — IBUPROFEN 800 MG PO TABS: 800.0000 mg | ORAL_TABLET | Freq: Three times a day (TID) | ORAL | Status: AC

## 2012-01-20 MED ORDER — AMOXICILLIN 500 MG PO CAPS: 500.0000 mg | ORAL_CAPSULE | Freq: Three times a day (TID) | ORAL | Status: AC

## 2012-01-20 NOTE — ED Notes (Signed)
Sore throat , onset today 

## 2012-01-20 NOTE — ED Provider Notes (Signed)
History     CSN: [id number]  Arrival date & time 01/20/12  7829   First MD Initiated Contact with Patient 01/20/12 1944      Chief Complaint  Patient presents with  . Sore Throat    (Consider location/radiation/quality/duration/timing/severity/associated sxs/prior treatment) Patient is a 36 y.o. female presenting with pharyngitis. The history is provided by the patient. No language interpreter was used.  Sore Throat This is a new problem. The current episode started today. The problem occurs constantly. The problem has been unchanged. Associated symptoms include congestion and a sore throat. Pertinent negatives include no abdominal pain, arthralgias, chest pain, coughing, fever, headaches, joint swelling, nausea, neck pain, numbness, rash, swollen glands, vomiting or weakness. Associated symptoms comments: Nasal congestion. The symptoms are aggravated by swallowing. She has tried nothing for the symptoms. The treatment provided no relief.    History reviewed. No pertinent past medical history.  Past Surgical History  Procedure Date  . Cesarean section   . Abdominal hysterectomy     History reviewed. No pertinent family history.  History  Substance Use Topics  . Smoking status: Current Everyday Smoker  . Smokeless tobacco: Not on file  . Alcohol Use: No    OB History    Grav Para Term Preterm Abortions TAB SAB Ect Mult Living                  Review of Systems  Constitutional: Negative for fever, activity change and appetite change.  HENT: Positive for congestion and sore throat. Negative for trouble swallowing and neck pain.   Respiratory: Negative for cough and wheezing.   Cardiovascular: Negative for chest pain.  Gastrointestinal: Negative for nausea, vomiting and abdominal pain.  Musculoskeletal: Negative for joint swelling and arthralgias.  Skin: Negative for rash.  Neurological: Negative for dizziness, weakness, numbness and headaches.  Hematological: Negative  for adenopathy.  All other systems reviewed and are negative.    Allergies  Codeine and Demerol  Home Medications  No current outpatient prescriptions on file.  BP 129/85  Pulse 97  Temp 98.2 F (36.8 C)  Resp 20  Ht 5\' 3"  (1.6 m)  Wt 250 lb (113.399 kg)  BMI 44.29 kg/m2  SpO2 99%  Physical Exam  Nursing note and vitals reviewed. Constitutional: She is oriented to person, place, and time. She appears well-developed and well-nourished. No distress.  HENT:  Head: Normocephalic and atraumatic. No trismus in the jaw.  Right Ear: Tympanic membrane and ear canal normal.  Left Ear: Tympanic membrane and ear canal normal.  Mouth/Throat: Uvula is midline and mucous membranes are normal. No uvula swelling. Posterior oropharyngeal edema and posterior oropharyngeal erythema present. No oropharyngeal exudate or tonsillar abscesses.  Neck: Normal range of motion. Neck supple.  Cardiovascular: Normal rate, regular rhythm, normal heart sounds and intact distal pulses.   No murmur heard. Pulmonary/Chest: Effort normal and breath sounds normal. No respiratory distress.  Musculoskeletal: Normal range of motion. She exhibits no tenderness.  Lymphadenopathy:    She has no cervical adenopathy.  Neurological: She is alert and oriented to person, place, and time. She exhibits normal muscle tone. Coordination normal.  Skin: Skin is warm and dry.    ED Course  Procedures (including critical care time)      MDM     Patient is alert to, vital signs are stable, she is nontoxic appearing. Mild erythema of the oropharynx and tonsils bilaterally. No facial swelling or trismus. Mother's had a recent exposure to streptococcal pharyngitis, her  child is also being treated.  Due to recent exposure I will start antibiotics.  She agrees to close f/u with her pmd if her sx's worsen   Patient / Family / Caregiver understand and agree with initial ED impression and plan with expectations set for ED  visit. Pt stable in ED with no significant deterioration in condition. Pt feels improved after observation and/or treatment in ED.         Drake Wuertz L. Trisha Mangle, [place] 01/22/12 1902

## 2012-01-20 NOTE — Discharge Instructions (Signed)
Strep Throat       Strep throat is an infection of the throat. It is caused by a germ. Strep throat spreads from person to person by coughing, sneezing, or close contact.   HOME CARE   Rinse your mouth (gargle) with warm salt water (1 teaspoon salt in 1 cup of water). Do this 3 to 4 times per day or as needed for comfort.   Family members with a sore throat or fever should see a doctor.   Make sure everyone in your house washes their hands well.   Do not share food, drinking cups, or personal items.   Eat soft foods until your sore throat gets better.   Drink enough water and fluids to keep your pee (urine) clear or pale yellow.   Rest.   Stay home from school, daycare, or work until you have taken medicine for 24 hours.   Only take medicine as told by your doctor.   Take your medicine as told. Finish it even if you start to feel better.  GET HELP RIGHT AWAY IF:   You have new problems, such as throwing up (vomiting) or bad headaches.   You have a stiff or painful neck, chest pain, trouble breathing, or trouble swallowing.   You have very bad throat pain, drooling, or changes in your voice.   Your neck puffs up (swells) or gets red and tender.   You have a fever.   You are very tired, your mouth is dry, or you are peeing less than normal.   You cannot wake up completely.   You get a rash, cough, or earache.   You have green, yellow-brown, or bloody spit.   Your pain does not get better with medicine.  MAKE SURE YOU:   Understand these instructions.   Will watch your condition.   Will get help right away if you are not doing well or get worse.  Document Released: 04/12/2008 Document Revised: 10/14/2011 Document Reviewed: 12/24/2010   ExitCare Patient Information 2012 [employer].

## 2012-01-20 NOTE — ED Notes (Signed)
Pt presents with sore throat and ear ache c/o. Son recently diagnosed with strep throat and pt worried she may have contracted it too. Pt denies headache, cough and fever at this time.

## 2012-01-23 NOTE — ED Provider Notes (Signed)
Medical screening examination/treatment/procedure(s) were performed by non-physician practitioner and as supervising physician I was immediately available for consultation/collaboration.   [name], MD 01/23/12 [phone]

## 2012-03-16 ENCOUNTER — Emergency Department (HOSPITAL_COMMUNITY): Payer: Self-pay

## 2012-03-16 ENCOUNTER — Encounter (HOSPITAL_COMMUNITY): Payer: Self-pay | Admitting: Emergency Medicine

## 2012-03-16 ENCOUNTER — Emergency Department (HOSPITAL_COMMUNITY)
Admission: EM | Admit: 2012-03-16 | Discharge: 2012-03-16 | Disposition: A | Discharge status: Home / Self Care | Payer: Self-pay | Attending: Emergency Medicine | Admitting: Emergency Medicine

## 2012-03-16 DIAGNOSIS — M25569 Pain in unspecified knee: Secondary | ICD-10-CM | POA: Insufficient documentation

## 2012-03-16 MED ORDER — HYDROCODONE-ACETAMINOPHEN 5-325 MG PO TABS: ORAL_TABLET | ORAL | Status: AC

## 2012-03-16 MED ORDER — NAPROXEN 500 MG PO TABS: 500.0000 mg | ORAL_TABLET | Freq: Two times a day (BID) | ORAL | Status: DC

## 2012-03-16 NOTE — Discharge Instructions (Signed)
Patella Problems (Patellofemoral Syndrome) This syndrome is caused by changes in the undersurface of the kneecap (patella). The changes vary from minor inflammation to major changes such as breakdown of the cartilage on the undersurface of the patella. The major changes can be seen with an arthroscope (a small, pencil-sized telescope). These changes can result from various factors. These factors may arise from abnormal tracking (movement or malalignment) of the patella. Normally the Patella is in its normal groove located between the condyles (grooved end) of the femur (thigh bone). Abnormal movement leads to increased pressure in the patellofemoral joint. This leads to swelling in the cartilage, inflammation and pain. SYMPTOMS   The patient with this syndrome usually has an ache in the knee. It is often aggravated by:  Prolonged sitting.   Squatting.   Climbing stairs.   Running down hill.   Other exercising that stresses the knee.  Other findings may include the knee giving way, swelling, and or locking. TREATMENT   The treatment will depend on the cause of the problem. Sometimes the solution is as simple as cutting down on activities. Giving your joint a rest with the use of crutches and braces can also help. This is generally followed by strengthening exercises. RECOVERY Recovery from a patellar problem depends on the type of problem in your knee and on the treatment required. If conservative treatment works the recovery period may be as little as three to four weeks. If more aggressive therapy such as surgery is required, the recovery period may be several months. Your caregiver will discuss this with you. HOME CARE INSTRUCTIONS  Following exercise, use an ice pack for twenty to thirty minutes three to four times per day. Use a towel between your ice pack and the skin.   Reduction of inflammation with anti-inflammatories may be helpful. Only take over-the-counter or prescription medicines  for pain, discomfort, or fever as directed by your caregiver.   Taping the knee or using a neoprene sleeve with a patellar cutout to provide better tracking of the patella may give relief.   Muscle (quadriceps) strengthening exercises are helpful. Follow your caregiver's advice.   Muscle stretching prior to exercise may be helpful.   Soft tissue therapy using ultrasound, and diathermy may be helpful.   If conservative therapy is not effective, surgery may provide relief. During arthroscopy, your caregiver may discover a rough surface beneath your kneecap. If this happens, your caregiver may smooth this out by shaving the surface.  SEEK MEDICAL CARE IF: If you have surgery, see your caregiver if:  There is increased bleeding or clear fluid (more than a small spot) from the wound.   You notice redness, swelling, or increasing pain in the wound.   Pus is coming from wound.   You develop an unexplained oral temperature above 102 F (38.9 C) develops, or as your caregiver suggests.   You notice a foul smell coming from the wound or dressing.   You develop increasing pain or stiffness in your knee.  SEEK IMMEDIATE MEDICAL CARE IF:    You develop a rash.   You have difficulty breathing.   You have any allergic problems.  MAKE SURE YOU:    Understand these instructions.   Will watch your condition.   Will get help right away if you are not doing well or get worse.  Document Released: 10/22/2000 Document Revised: 10/14/2011 Document Reviewed: 11/11/2008 Exit[employer] Patient Information 2012 [employer]. 

## 2012-03-16 NOTE — ED Provider Notes (Signed)
History     CSN: [id number]  Arrival date & time 03/16/12  1358   First MD Initiated Contact with Patient 03/16/12 1408      Chief Complaint  Patient presents with  . Knee Pain    (Consider location/radiation/quality/duration/timing/severity/associated sxs/prior treatment) HPI Comments: Patient c/o pain with weight bearing and bending of her left knee.  Describes hearing a "crunching" sound and a "grinding" sensation in her left knee with flexion and movement. Pain improves with rest.  She denies known injury, but states she does have many steps at her home.  She denies numbness, weakness or color change.   Patient is a 36 y.o. female presenting with knee pain. The history is provided by the patient.  Knee Pain This is a new problem. The current episode started 1 to 4 weeks ago. The problem occurs constantly. The problem has been unchanged. Associated symptoms include arthralgias and joint swelling. Pertinent negatives include no fever, headaches, myalgias, neck pain, numbness, rash, sore throat, vomiting or weakness. The symptoms are aggravated by bending, standing and walking. She has tried nothing for the symptoms. The treatment provided no relief.    History reviewed. No pertinent past medical history.  Past Surgical History  Procedure Date  . Cesarean section   . Abdominal hysterectomy     No family history on file.  History  Substance Use Topics  . Smoking status: Current Everyday Smoker  . Smokeless tobacco: Not on file  . Alcohol Use: No    OB History    Grav Para Term Preterm Abortions TAB SAB Ect Mult Living                  Review of Systems  Constitutional: Negative for fever.  HENT: Negative for sore throat and neck pain.   Gastrointestinal: Negative for vomiting.  Musculoskeletal: Positive for joint swelling and arthralgias. Negative for myalgias.  Skin: Negative for rash.  Neurological: Negative for weakness, numbness and headaches.  All other systems  reviewed and are negative.    Allergies  Codeine and Demerol  Home Medications  No current outpatient prescriptions on file.  BP 117/85  Pulse 96  Temp 97.9 F (36.6 C)  Resp 20  Ht 5\' 3"  (1.6 m)  Wt 255 lb (115.667 kg)  BMI 45.17 kg/m2  SpO2 95%  Physical Exam  Nursing note and vitals reviewed. Constitutional: She is oriented to person, place, and time. She appears well-developed and well-nourished. No distress.  HENT:  Head: Normocephalic and atraumatic.  Cardiovascular: Normal rate, regular rhythm and normal heart sounds.   Pulmonary/Chest: Effort normal and breath sounds normal.  Musculoskeletal: She exhibits tenderness. She exhibits no edema.       Left knee: She exhibits normal range of motion, no swelling, no effusion, no ecchymosis, no deformity, no erythema and normal meniscus. tenderness found. No patellar tendon tenderness noted.       Legs:      ttp of the anterior left knee.  Crepitus present with movement.  No effusion on exam  No obvious ligament instability  Neurological: She is alert and oriented to person, place, and time. She exhibits normal muscle tone. Coordination normal.  Skin: Skin is warm and dry. No rash noted. No erythema.    ED Course  Procedures (including critical care time)    Dg Knee Complete 4 Views Left  03/16/2012  *RADIOLOGY REPORT*  Clinical Data: Anterior left knee pain over the past 2 weeks.  No known injuries.  LEFT KNEE -  COMPLETE 4+ VIEW 03/16/2012:  Comparison: MRI left knee 05/05/2005.  Findings: No evidence of acute, subacute, or healed fractures. Well-preserved joint spaces.  No intrinsic osseous abnormalities. No evidence of a significant joint effusion.  IMPRESSION: Normal examination.  Original Report Authenticated By: Arnell Sieving, M.D.     Knee immobilizer applied, pain improved remains NV intact   MDM    Previous medical charts, nursing notes and vitals signs from this visit were reviewed by me   All  laboratory results and/or imaging results performed on this visit, if applicable, were reviewed by me and discussed with the patient and/or parent as well as recommendation for follow-up    MEDICATIONS GIVEN IN ED:  none  ttp of the left anterior knee at location of the patella.  Mild crepitus.  No effusion.  Pt has full ROM.  No deformity, erythema , calf pain or swelling, or excessive warmth.  I do not suspect a septic joint or prepatellar bursitis.  Pt agrees to f/u with Dr. Romeo Apple      PRESCRIPTIONS GIVEN AT DISCHARGE:  Naproxen, norco #20      Pt stable in ED with no significant deterioration in condition. Pt feels improved after observation and/or treatment in ED. Patient / Family / Caregiver understand and agree with initial ED impression and plan with expectations set for ED visit.  Patient agrees to return to ED for any worsening symptoms          Adhira Jamil L. [place], [place] 03/16/12 2045

## 2012-03-16 NOTE — ED Notes (Signed)
Pt c/o knee pain/swelling x 2 weeks. Denies injury.

## 2012-03-16 NOTE — ED Notes (Signed)
Pt c/o L knee pain x 2 weeks; increased pain today; Knee noted to be slightly swollen no redness noted Pain denies injury and pain increases with ambulation

## 2012-03-22 NOTE — ED Provider Notes (Signed)
Medical screening examination/treatment/procedure(s) were performed by non-physician practitioner and as supervising physician I was immediately available for consultation/collaboration.   Carleene Cooper III, MD 03/22/12 2035

## 2012-09-15 ENCOUNTER — Emergency Department (HOSPITAL_COMMUNITY): Payer: Self-pay

## 2012-09-15 ENCOUNTER — Emergency Department (HOSPITAL_COMMUNITY)
Admission: EM | Admit: 2012-09-15 | Discharge: 2012-09-15 | Disposition: A | Discharge status: Home / Self Care | Payer: Self-pay | Attending: Emergency Medicine | Admitting: Emergency Medicine

## 2012-09-15 ENCOUNTER — Encounter (HOSPITAL_COMMUNITY): Payer: Self-pay | Admitting: *Deleted

## 2012-09-15 DIAGNOSIS — M549 Dorsalgia, unspecified: Secondary | ICD-10-CM | POA: Insufficient documentation

## 2012-09-15 DIAGNOSIS — J40 Bronchitis, not specified as acute or chronic: Secondary | ICD-10-CM | POA: Insufficient documentation

## 2012-09-15 DIAGNOSIS — J069 Acute upper respiratory infection, unspecified: Secondary | ICD-10-CM | POA: Insufficient documentation

## 2012-09-15 DIAGNOSIS — Z791 Long term (current) use of non-steroidal anti-inflammatories (NSAID): Secondary | ICD-10-CM | POA: Insufficient documentation

## 2012-09-15 DIAGNOSIS — F17200 Nicotine dependence, unspecified, uncomplicated: Secondary | ICD-10-CM | POA: Insufficient documentation

## 2012-09-15 MED ORDER — PREDNISONE 20 MG PO TABS: 60.0000 mg | ORAL_TABLET | Freq: Every day | ORAL | Status: DC

## 2012-09-15 MED ORDER — ALBUTEROL SULFATE HFA 108 (90 BASE) MCG/ACT IN AERS
2.0000 | INHALATION_SPRAY | RESPIRATORY_TRACT | Status: DC | PRN
Start: 2012-09-15 — End: 2012-09-15
  Administered 2012-09-15: 2 via RESPIRATORY_TRACT
  Filled 2012-09-15: qty 6.7

## 2012-09-15 MED ORDER — IPRATROPIUM BROMIDE 0.02 % IN SOLN
0.5000 mg | Freq: Once | RESPIRATORY_TRACT | Status: AC
Start: 2012-09-15 — End: 2012-09-15
  Administered 2012-09-15: 0.5 mg via RESPIRATORY_TRACT
  Filled 2012-09-15: qty 2.5

## 2012-09-15 MED ORDER — ALBUTEROL SULFATE (5 MG/ML) 0.5% IN NEBU
2.5000 mg | INHALATION_SOLUTION | Freq: Once | RESPIRATORY_TRACT | Status: AC
  Administered 2012-09-15: 2.5 mg via RESPIRATORY_TRACT
  Filled 2012-09-15: qty 0.5

## 2012-09-15 MED ORDER — PREDNISONE 50 MG PO TABS
60.0000 mg | ORAL_TABLET | Freq: Once | ORAL | Status: AC
  Administered 2012-09-15: 60 mg via ORAL
  Filled 2012-09-15: qty 1

## 2012-09-15 MED ORDER — ACETAMINOPHEN 325 MG PO TABS: 650.0000 mg | ORAL_TABLET | Freq: Once | ORAL | Status: DC

## 2012-09-15 NOTE — ED Notes (Signed)
Pt with back pain, cough (NP), sob and HA started yesterday morning

## 2012-09-15 NOTE — ED Notes (Signed)
Paged RT for inhaler 

## 2012-09-15 NOTE — ED Provider Notes (Signed)
History     CSN: [id number]  Arrival date & time 09/15/12  1133   First MD Initiated Contact with Patient 09/15/12 1350      Chief Complaint  Patient presents with  . Nasal Congestion  . Cough  . Back Pain    (Consider location/radiation/quality/duration/timing/severity/associated sxs/prior treatment) Patient is a 36 y.o. female presenting with cough and back pain. The history is provided by the patient.  Cough  Back Pain   She had onset yesterday of congestion in her chest and head associated with a nonproductive cough and wheezing. Denies fever, chills, sweats. Denies nausea, vomiting, diarrhea. She has had a headache and aching in her neck and back. She denies sick contacts. She's taking over-the-counter cold medication with no relief. Symptoms are described as moderate to severe.  No past medical history on file.  Past Surgical History  Procedure Date  . Cesarean section   . Abdominal hysterectomy   . Carpal tunnel release     No family history on file.  History  Substance Use Topics  . Smoking status: Current Every Day Smoker -- 0.3 packs/day    Types: Cigarettes  . Smokeless tobacco: Not on file  . Alcohol Use: No    OB History    Grav Para Term Preterm Abortions TAB SAB Ect Mult Living                  Review of Systems  Respiratory: Positive for cough.   Musculoskeletal: Positive for back pain.  All other systems reviewed and are negative.    Allergies  Codeine and Demerol  Home Medications   Current Outpatient Rx  Name  Route  Sig  Dispense  Refill  . CHLORPHENIRAMINE-ACETAMINOPHEN 2-325 MG PO TABS   Oral   Take 2 tablets by mouth daily as needed.         [name] NAPROXEN 500 MG PO TABS   Oral   Take 1 tablet (500 mg total) by mouth 2 (two) times daily.   30 tablet   0     BP 122/91  Pulse 102  Temp 98.2 F (36.8 C) (Oral)  Resp 20  Ht 5\' 3"  (1.6 m)  Wt 260 lb (117.935 kg)  BMI 46.06 kg/m2  Physical Exam  Nursing note and  vitals reviewed. 36 year old female, resting comfortably and in no acute distress. Vital signs are significant for borderline hypertension with blood pressure 122/91. Oxygen saturation is 96%, which is normal. Head is normocephalic and atraumatic. PERRLA, EOMI. Oropharynx is clear. There is no sinus tenderness. There is no edema of the turbinates and no nasal drainage seen. Neck is nontender and supple without adenopathy or JVD. Back is nontender and there is no CVA tenderness. Lungs have prolonged exhalation phase with slight wheezing, no rales or rhonchi. Chest is nontender. Heart has regular rate and rhythm without murmur. Abdomen is soft, flat, nontender without masses or hepatosplenomegaly and peristalsis is normoactive. Extremities have no cyanosis or edema, full range of motion is present. Skin is warm and dry without rash. Neurologic: Mental status is normal, cranial nerves are intact, there are no motor or sensory deficits.   ED Course  Procedures (including critical care time)  Dg Chest 2 View  09/15/2012  *RADIOLOGY REPORT*  Clinical Data: Chest and back pain  CHEST - 2 VIEW  Comparison: 08/12/2010  Findings: Heart size is normal.  Mediastinal shadows are normal. Lungs are clear.  No effusions.  No bony abnormalities.  IMPRESSION: Normal  chest   Original Report Authenticated By: Paulina Fusi, M.D.      1. Upper respiratory infection   2. Bronchitis       MDM  Respiratory infection with bronchitis. Chest x-ray had been ordered and shows no evidence of pneumonia. She will be given albuterol with Atrovent as well as an initial dose of the prednisone and reassessed. I anticipate sending her home with an albuterol inhaler.  She got good subjective relief with albuterol with Atrovent. On reexam, lungs are clear. She is sent home with prescription for prednisone and advised to use the albuterol inhaler as needed. She is advised to stop smoking.    Dione Booze, MD 09/15/12  1501

## 2012-10-02 ENCOUNTER — Encounter (HOSPITAL_COMMUNITY): Payer: Self-pay | Admitting: *Deleted

## 2012-10-17 ENCOUNTER — Encounter (HOSPITAL_COMMUNITY): Payer: Self-pay

## 2012-10-17 ENCOUNTER — Other Ambulatory Visit: Payer: Self-pay | Admitting: Obstetrics and Gynecology

## 2012-10-17 ENCOUNTER — Ambulatory Visit (HOSPITAL_COMMUNITY)
Admission: RE | Admit: 2012-10-17 | Discharge: 2012-10-17 | Disposition: A | Discharge status: Home / Self Care | Payer: Self-pay | Source: Ambulatory Visit | Attending: Obstetrics and Gynecology | Admitting: Obstetrics and Gynecology

## 2012-10-17 VITALS — BP 110/82 | Temp 97.9°F | Ht 63.5 in | Wt 268.8 lb

## 2012-10-17 DIAGNOSIS — N6452 Nipple discharge: Secondary | ICD-10-CM

## 2012-10-17 DIAGNOSIS — N644 Mastodynia: Secondary | ICD-10-CM | POA: Insufficient documentation

## 2012-10-17 DIAGNOSIS — Z1239 Encounter for other screening for malignant neoplasm of breast: Secondary | ICD-10-CM

## 2012-10-17 HISTORY — DX: Anemia, unspecified: D64.9

## 2012-10-17 HISTORY — DX: Essential (primary) hypertension: I10

## 2012-10-17 NOTE — Progress Notes (Signed)
Complaints of right breast lump and pain x 8-9 months. Patient rated pain in right breast at a 10 out of 10 at times. Patient also complained of bilateral nipple discharge that is at times spontaneous and others when expresses. Patient states the color changes from brownish to clear/cloudy.  Pap Smear:    Pap smear not performed today. Per patient last Pap smear was in 2009 at [facility] and normal. Per patient she has no history of abnormal Pap smears. Patient has a history of a hysterectomy 02/26/2008 due to endometriosis. Pap smear result on 10/11/2001 is in EPIC but last Pap smear result is not.  Physical exam: Breasts Breasts symmetrical. No skin abnormalities bilateral breasts. No nipple retraction bilateral breasts. Bilateral clear nipple discharge. Samples sent to cytology of each. Per patient at times is spontaneous and others when expresses. Per patient the color is sometimes brownish and others clear to cloudy. No lymphadenopathy. No lumps palpated bilateral breasts. Patient complained of tenderness when palpated right upper outer quadrant of breast. Patient referred to the [facility] for diagnostic mammogram. Appointment scheduled for Tuesday, October 24, 2012 at 1500.      Pelvic/Bimanual No Pap smear completed today since patient has a history of a hysterectomy for benign reasons. Pap smear not indicated per BCCCP guidelines.

## 2012-10-17 NOTE — Patient Instructions (Signed)
Taught patient how to perform BSE and gave educational materials to take home. Patient did not need a Pap smear today due to history of a hysterectomy in 2009 for benign reasons. Let patient know that she will not need any further Pap smears due to her history of a hysterectomy. Patient referred to the [facility] for diagnostic mammogram. Appointment scheduled for Tuesday, October 24, 2012 at 1500. Patient aware of appointment and will be there. Patient verbalized understanding.

## 2012-11-18 ENCOUNTER — Encounter (HOSPITAL_COMMUNITY): Payer: Self-pay

## 2012-11-18 ENCOUNTER — Emergency Department (HOSPITAL_COMMUNITY)
Admission: EM | Admit: 2012-11-18 | Discharge: 2012-11-18 | Disposition: A | Discharge status: Home / Self Care | Payer: Medicaid Other | Attending: Emergency Medicine | Admitting: Emergency Medicine

## 2012-11-18 DIAGNOSIS — L600 Ingrowing nail: Secondary | ICD-10-CM | POA: Insufficient documentation

## 2012-11-18 DIAGNOSIS — IMO0002 Reserved for concepts with insufficient information to code with codable children: Secondary | ICD-10-CM | POA: Insufficient documentation

## 2012-11-18 DIAGNOSIS — I10 Essential (primary) hypertension: Secondary | ICD-10-CM | POA: Insufficient documentation

## 2012-11-18 DIAGNOSIS — Z8739 Personal history of other diseases of the musculoskeletal system and connective tissue: Secondary | ICD-10-CM | POA: Insufficient documentation

## 2012-11-18 DIAGNOSIS — Z79899 Other long term (current) drug therapy: Secondary | ICD-10-CM | POA: Insufficient documentation

## 2012-11-18 DIAGNOSIS — F17200 Nicotine dependence, unspecified, uncomplicated: Secondary | ICD-10-CM | POA: Insufficient documentation

## 2012-11-18 MED ORDER — LIDOCAINE HCL (PF) 1 % IJ SOLN
INTRAMUSCULAR | Status: AC
  Administered 2012-11-18: 14:00:00
  Filled 2012-11-18: qty 5

## 2012-11-18 MED ORDER — HYDROCODONE-ACETAMINOPHEN 5-325 MG PO TABS: 1.0000 | ORAL_TABLET | Freq: Four times a day (QID) | ORAL | Status: DC | PRN

## 2012-11-18 MED ORDER — BACITRACIN ZINC 500 UNIT/GM EX OINT
TOPICAL_OINTMENT | CUTANEOUS | Status: AC
  Administered 2012-11-18: 14:00:00
  Filled 2012-11-18: qty 0.9

## 2012-11-18 NOTE — ED Notes (Signed)
Pt c/o ingrown toenail on r great toe x 1 month.

## 2012-11-18 NOTE — ED Provider Notes (Signed)
History    Scribed for Robin Lennert, MD, the patient was seen in room APFT23/APFT23. This chart was scribed by Katha Cabal.   CSN: [id number]  Arrival date & time 11/18/12  1230   First MD Initiated Contact with Patient 11/18/12 1323      Chief Complaint  Patient presents with  . Toe Pain    (Consider location/radiation/quality/duration/timing/severity/associated sxs/prior Treatment)  Robin Lennert, MD entered patient's room at 1:25 PM  Patient is a 37 y.o. female presenting with toe pain. The history is provided by the patient and a friend. No language interpreter was used.  Toe Pain This is a new problem. Episode onset: about a month ago. The problem occurs constantly. The problem has been gradually worsening. Exacerbated by: touching top of toenail. Nothing relieves the symptoms.      Past Medical History  Diagnosis Date  . Hypertension   . Anemia     Past Surgical History  Procedure Date  . Cesarean section   . Abdominal hysterectomy   . Carpal tunnel release   . Ovarian cyst removed     Family History  Problem Relation Age of Onset  . Cancer Mother     cervical  . Hypertension Mother   . Heart disease Mother     History  Substance Use Topics  . Smoking status: Current Every Day Smoker -- 0.3 packs/day for 18 years    Types: Cigarettes  . Smokeless tobacco: Not on file  . Alcohol Use: No    OB History    Grav Para Term Preterm Abortions TAB SAB Ect Mult Living   2 2 2       2       Review of Systems  All other systems reviewed and are negative.    Allergies  Codeine and Demerol  Home Medications   Current Outpatient Rx  Name  Route  Sig  Dispense  Refill  . CHLORPHENIRAMINE-ACETAMINOPHEN 2-325 MG PO TABS   Oral   Take 2 tablets by mouth daily as needed.         . IBUPROFEN 200 MG PO TABS   Oral   Take 200 mg by mouth every 6 (six) hours as needed.         [name] NAPROXEN 500 MG PO TABS   Oral   Take 1 tablet (500 mg total)  by mouth 2 (two) times daily.   30 tablet   0   . PREDNISONE 20 MG PO TABS   Oral   Take 3 tablets (60 mg total) by mouth daily.   15 tablet   0     BP 126/89  Pulse 99  Temp 98.3 F (36.8 C) (Oral)  Resp 18  Ht 5\' 3"  (1.6 m)  Wt 270 lb (122.471 kg)  BMI 47.83 kg/m2  SpO2 96%  Physical Exam  Constitutional: She is oriented to person, place, and time. She appears well-developed.  HENT:  Head: Normocephalic.  Eyes: Conjunctivae normal are normal.  Neck: No tracheal deviation present.  Cardiovascular:  No murmur heard. Musculoskeletal: Normal range of motion.       Ingrown nail bilateral of right great toe, worse on the  Lateral right   Neurological: She is oriented to person, place, and time.  Skin: Skin is warm.  Psychiatric: She has a normal mood and affect.    ED Course  NERVE BLOCK Performed by: Laquia Rosano L Authorized by: Bethann Berkshire L Comments: Pt had a digital block with  2% lidocaine to left large toe.  The pt tolerated the procedure well.              2nd procedure.  Left large toenail removed without problems   (including critical care time)    DIAGNOSTIC STUDIES: Oxygen Saturation is 96% on room air normal by my interpretation.     COORDINATION OF CARE: 1:27 PM  Physical exam complete  Will remove upper portion of right great toenail.   2:02 PM  Right great toenail removed. Patient tolerated procedure well.   LABS / RADIOLOGY:   Labs Reviewed - No data to display No results found.         IMPRESSION: No diagnosis found.   NEW MEDICATIONS: New Prescriptions   No medications on file             MDM       The chart was scribed for me under my direct supervision.  I personally performed the history, physical, and medical decision making and all procedures in the evaluation of this patient.Robin Lennert, MD 11/18/12 [phone]

## 2013-01-19 ENCOUNTER — Ambulatory Visit
Admission: RE | Admit: 2013-01-19 | Discharge: 2013-01-19 | Disposition: A | Discharge status: Home / Self Care | Payer: No Typology Code available for payment source | Source: Ambulatory Visit | Attending: Obstetrics and Gynecology | Admitting: Obstetrics and Gynecology

## 2013-01-19 DIAGNOSIS — N644 Mastodynia: Secondary | ICD-10-CM

## 2013-01-19 DIAGNOSIS — N6452 Nipple discharge: Secondary | ICD-10-CM

## 2013-10-10 ENCOUNTER — Other Ambulatory Visit: Payer: Self-pay | Admitting: Obstetrics and Gynecology

## 2013-11-29 ENCOUNTER — Encounter: Payer: Self-pay | Admitting: *Deleted

## 2013-11-29 ENCOUNTER — Other Ambulatory Visit: Payer: Self-pay | Admitting: Obstetrics and Gynecology

## 2014-01-23 DIAGNOSIS — G8929 Other chronic pain: Secondary | ICD-10-CM | POA: Insufficient documentation

## 2014-01-23 DIAGNOSIS — M797 Fibromyalgia: Secondary | ICD-10-CM | POA: Insufficient documentation

## 2014-01-23 DIAGNOSIS — M545 Low back pain, unspecified: Secondary | ICD-10-CM | POA: Insufficient documentation

## 2014-03-06 ENCOUNTER — Other Ambulatory Visit: Payer: Self-pay | Admitting: Obstetrics and Gynecology

## 2014-03-21 ENCOUNTER — Other Ambulatory Visit: Payer: Self-pay | Admitting: Obstetrics and Gynecology

## 2014-03-27 ENCOUNTER — Encounter: Payer: Self-pay | Admitting: *Deleted

## 2014-03-27 ENCOUNTER — Other Ambulatory Visit: Payer: Self-pay | Admitting: Obstetrics and Gynecology

## 2014-04-11 ENCOUNTER — Telehealth: Payer: Self-pay | Admitting: Family Medicine

## 2014-04-12 NOTE — Telephone Encounter (Signed)
Patient hasn't been here in over 3 years. She now has [employer]. She has been seeing a provider with [facility] but would like to transfer back to our office. She felt that they weren't able to treat all of her problems. Advised that we may be limited in what we can treat as well. She is to bring all medications with her to the appt. Appt on 05/15/14 with Jannifer Rodney, FNP.

## 2014-05-15 ENCOUNTER — Ambulatory Visit: Payer: Self-pay | Admitting: Family

## 2014-05-19 ENCOUNTER — Encounter (HOSPITAL_COMMUNITY)
Admission: EM | Disposition: A | Discharge status: Home / Self Care | Payer: Self-pay | Source: Home / Self Care | Attending: Emergency Medicine

## 2014-05-19 ENCOUNTER — Emergency Department (HOSPITAL_COMMUNITY): Payer: BC Managed Care – PPO

## 2014-05-19 ENCOUNTER — Emergency Department (HOSPITAL_COMMUNITY)
Admission: EM | Admit: 2014-05-19 | Discharge: 2014-05-19 | Disposition: A | Discharge status: Home / Self Care | Payer: BC Managed Care – PPO | Attending: General Surgery | Admitting: General Surgery

## 2014-05-19 ENCOUNTER — Encounter (HOSPITAL_COMMUNITY): Payer: Self-pay | Admitting: Emergency Medicine

## 2014-05-19 ENCOUNTER — Emergency Department (HOSPITAL_COMMUNITY): Payer: BC Managed Care – PPO | Admitting: Anesthesiology

## 2014-05-19 ENCOUNTER — Encounter (HOSPITAL_COMMUNITY): Payer: BC Managed Care – PPO | Admitting: Anesthesiology

## 2014-05-19 DIAGNOSIS — Z885 Allergy status to narcotic agent status: Secondary | ICD-10-CM | POA: Insufficient documentation

## 2014-05-19 DIAGNOSIS — Z881 Allergy status to other antibiotic agents status: Secondary | ICD-10-CM | POA: Insufficient documentation

## 2014-05-19 DIAGNOSIS — K3580 Unspecified acute appendicitis: Secondary | ICD-10-CM | POA: Insufficient documentation

## 2014-05-19 DIAGNOSIS — I10 Essential (primary) hypertension: Secondary | ICD-10-CM | POA: Insufficient documentation

## 2014-05-19 DIAGNOSIS — F17200 Nicotine dependence, unspecified, uncomplicated: Secondary | ICD-10-CM | POA: Insufficient documentation

## 2014-05-19 HISTORY — PX: LAPAROSCOPIC APPENDECTOMY: SHX408

## 2014-05-19 LAB — CBC WITH DIFFERENTIAL/PLATELET
BASOS ABS: 0 10*3/uL (ref 0.0–0.1)
Basophils Relative: 0 % (ref 0–1)
EOS PCT: 2 % (ref 0–5)
Eosinophils Absolute: 0.4 10*3/uL (ref 0.0–0.7)
HCT: 41 % (ref 36.0–46.0)
Hemoglobin: 14.1 g/dL (ref 12.0–15.0)
LYMPHS ABS: 2.6 10*3/uL (ref 0.7–4.0)
LYMPHS PCT: 17 % (ref 12–46)
MCH: 30.3 pg (ref 26.0–34.0)
MCHC: 34.4 g/dL (ref 30.0–36.0)
MCV: 88 fL (ref 78.0–100.0)
Monocytes Absolute: 1 10*3/uL (ref 0.1–1.0)
Monocytes Relative: 7 % (ref 3–12)
NEUTROS ABS: 11.1 10*3/uL — AB (ref 1.7–7.7)
NEUTROS PCT: 74 % (ref 43–77)
PLATELETS: 253 10*3/uL (ref 150–400)
RBC: 4.66 MIL/uL (ref 3.87–5.11)
RDW: 13.5 % (ref 11.5–15.5)
WBC: 15.1 10*3/uL — AB (ref 4.0–10.5)

## 2014-05-19 LAB — COMPREHENSIVE METABOLIC PANEL
ALK PHOS: 70 U/L (ref 39–117)
ALT: 23 U/L (ref 0–35)
AST: 17 U/L (ref 0–37)
Albumin: 3.5 g/dL (ref 3.5–5.2)
Anion gap: 14 (ref 5–15)
BILIRUBIN TOTAL: 0.4 mg/dL (ref 0.3–1.2)
BUN: 11 mg/dL (ref 6–23)
CHLORIDE: 105 meq/L (ref 96–112)
CO2: 22 meq/L (ref 19–32)
Calcium: 9 mg/dL (ref 8.4–10.5)
Creatinine, Ser: 0.57 mg/dL (ref 0.50–1.10)
GLUCOSE: 118 mg/dL — AB (ref 70–99)
POTASSIUM: 3.9 meq/L (ref 3.7–5.3)
SODIUM: 141 meq/L (ref 137–147)
Total Protein: 6.8 g/dL (ref 6.0–8.3)

## 2014-05-19 LAB — URINALYSIS, ROUTINE W REFLEX MICROSCOPIC
Bilirubin Urine: NEGATIVE
Glucose, UA: NEGATIVE mg/dL
HGB URINE DIPSTICK: NEGATIVE
Ketones, ur: NEGATIVE mg/dL
LEUKOCYTES UA: NEGATIVE
Nitrite: NEGATIVE
Protein, ur: NEGATIVE mg/dL
UROBILINOGEN UA: 0.2 mg/dL (ref 0.0–1.0)
pH: 5.5 (ref 5.0–8.0)

## 2014-05-19 LAB — LIPASE, BLOOD: Lipase: 36 U/L (ref 11–59)

## 2014-05-19 SURGERY — APPENDECTOMY, LAPAROSCOPIC
Anesthesia: General | Site: Abdomen

## 2014-05-19 MED ORDER — OXYCODONE-ACETAMINOPHEN 7.5-325 MG PO TABS: 1.0000 | ORAL_TABLET | ORAL | Status: DC | PRN

## 2014-05-19 MED ORDER — GLYCOPYRROLATE 0.2 MG/ML IJ SOLN
INTRAMUSCULAR | Status: AC
  Filled 2014-05-19: qty 2

## 2014-05-19 MED ORDER — IOHEXOL 300 MG/ML  SOLN
50.0000 mL | Freq: Once | INTRAMUSCULAR | Status: AC | PRN
  Administered 2014-05-19: 50 mL via ORAL

## 2014-05-19 MED ORDER — SODIUM CHLORIDE 0.9 % IV SOLN
INTRAVENOUS | Status: DC | PRN
  Administered 2014-05-19: 09:00:00 via INTRAVENOUS

## 2014-05-19 MED ORDER — CLINDAMYCIN PHOSPHATE 900 MG/50ML IV SOLN
900.0000 mg | Freq: Once | INTRAVENOUS | Status: AC
  Administered 2014-05-19: 900 mg via INTRAVENOUS
  Filled 2014-05-19: qty 50

## 2014-05-19 MED ORDER — OXYCODONE-ACETAMINOPHEN 5-325 MG PO TABS
1.0000 | ORAL_TABLET | Freq: Once | ORAL | Status: AC
Start: 2014-05-19 — End: 2014-05-19
  Administered 2014-05-19: 1 via ORAL

## 2014-05-19 MED ORDER — ROCURONIUM BROMIDE 100 MG/10ML IV SOLN
INTRAVENOUS | Status: DC | PRN
  Administered 2014-05-19: 5 mg via INTRAVENOUS
  Administered 2014-05-19: 25 mg via INTRAVENOUS
  Administered 2014-05-19 (×2): 5 mg via INTRAVENOUS

## 2014-05-19 MED ORDER — SODIUM CHLORIDE 0.9 % IV SOLN
3.0000 g | Freq: Once | INTRAVENOUS | Status: AC
  Administered 2014-05-19: 3 g via INTRAVENOUS
  Filled 2014-05-19: qty 3

## 2014-05-19 MED ORDER — SODIUM CHLORIDE 0.9 % IV SOLN
INTRAVENOUS | Status: DC | PRN
  Administered 2014-05-19: 10:00:00 via INTRAVENOUS

## 2014-05-19 MED ORDER — IOHEXOL 300 MG/ML  SOLN
100.0000 mL | Freq: Once | INTRAMUSCULAR | Status: AC | PRN
  Administered 2014-05-19: 100 mL via INTRAVENOUS

## 2014-05-19 MED ORDER — ONDANSETRON HCL 4 MG PO TABS: 4.0000 mg | ORAL_TABLET | Freq: Three times a day (TID) | ORAL | Status: DC | PRN

## 2014-05-19 MED ORDER — LIDOCAINE HCL (PF) 1 % IJ SOLN
INTRAMUSCULAR | Status: AC
  Filled 2014-05-19: qty 5

## 2014-05-19 MED ORDER — ONDANSETRON HCL 4 MG/2ML IJ SOLN
INTRAMUSCULAR | Status: DC | PRN
  Administered 2014-05-19: 4 mg via INTRAVENOUS

## 2014-05-19 MED ORDER — SODIUM CHLORIDE 0.9 % IR SOLN
Status: DC | PRN
  Administered 2014-05-19: 1000 mL

## 2014-05-19 MED ORDER — POVIDONE-IODINE 10 % OINT PACKET
TOPICAL_OINTMENT | CUTANEOUS | Status: DC | PRN
  Administered 2014-05-19: 1 via TOPICAL

## 2014-05-19 MED ORDER — ONDANSETRON HCL 4 MG/2ML IJ SOLN
4.0000 mg | Freq: Once | INTRAMUSCULAR | Status: AC
  Administered 2014-05-19: 4 mg via INTRAVENOUS
  Filled 2014-05-19: qty 2

## 2014-05-19 MED ORDER — PROPOFOL 10 MG/ML IV BOLUS
INTRAVENOUS | Status: DC | PRN
  Administered 2014-05-19 (×2): 50 mg via INTRAVENOUS
  Administered 2014-05-19: 200 mg via INTRAVENOUS
  Administered 2014-05-19: 50 mg via INTRAVENOUS

## 2014-05-19 MED ORDER — KETOROLAC TROMETHAMINE 30 MG/ML IJ SOLN
30.0000 mg | Freq: Once | INTRAMUSCULAR | Status: AC
  Administered 2014-05-19: 30 mg via INTRAVENOUS
  Filled 2014-05-19: qty 1

## 2014-05-19 MED ORDER — PROPOFOL 10 MG/ML IV EMUL
INTRAVENOUS | Status: AC
  Filled 2014-05-19: qty 20

## 2014-05-19 MED ORDER — NEOSTIGMINE METHYLSULFATE 10 MG/10ML IV SOLN
INTRAVENOUS | Status: DC | PRN
  Administered 2014-05-19: 4 mg via INTRAVENOUS

## 2014-05-19 MED ORDER — HYDROMORPHONE HCL PF 1 MG/ML IJ SOLN
0.5000 mg | Freq: Once | INTRAMUSCULAR | Status: AC
  Administered 2014-05-19: 0.5 mg via INTRAVENOUS
  Filled 2014-05-19: qty 1

## 2014-05-19 MED ORDER — FENTANYL CITRATE 0.05 MG/ML IJ SOLN
INTRAMUSCULAR | Status: AC
  Filled 2014-05-19: qty 5

## 2014-05-19 MED ORDER — FENTANYL CITRATE 0.05 MG/ML IJ SOLN
INTRAMUSCULAR | Status: DC | PRN
  Administered 2014-05-19 (×5): 50 ug via INTRAVENOUS

## 2014-05-19 MED ORDER — BUPIVACAINE HCL (PF) 0.5 % IJ SOLN
INTRAMUSCULAR | Status: DC | PRN
  Administered 2014-05-19: 10 mL

## 2014-05-19 MED ORDER — ONDANSETRON HCL 4 MG/2ML IJ SOLN
INTRAMUSCULAR | Status: AC
  Filled 2014-05-19: qty 2

## 2014-05-19 MED ORDER — SODIUM CHLORIDE 0.9 % IV BOLUS (SEPSIS)
1000.0000 mL | Freq: Once | INTRAVENOUS | Status: AC
  Administered 2014-05-19: 1000 mL via INTRAVENOUS

## 2014-05-19 MED ORDER — GLYCOPYRROLATE 0.2 MG/ML IJ SOLN
INTRAMUSCULAR | Status: AC
  Filled 2014-05-19: qty 1

## 2014-05-19 MED ORDER — ROCURONIUM BROMIDE 50 MG/5ML IV SOLN
INTRAVENOUS | Status: AC
  Filled 2014-05-19: qty 1

## 2014-05-19 MED ORDER — ONDANSETRON HCL 4 MG/2ML IJ SOLN
4.0000 mg | Freq: Once | INTRAMUSCULAR | Status: AC
  Administered 2014-05-19: 4 mg via INTRAVENOUS

## 2014-05-19 MED ORDER — POVIDONE-IODINE 10 % EX OINT
TOPICAL_OINTMENT | CUTANEOUS | Status: AC
  Filled 2014-05-19: qty 1

## 2014-05-19 MED ORDER — SUCCINYLCHOLINE CHLORIDE 20 MG/ML IJ SOLN
INTRAMUSCULAR | Status: AC
  Filled 2014-05-19: qty 1

## 2014-05-19 MED ORDER — DIPHENHYDRAMINE HCL 50 MG/ML IJ SOLN
25.0000 mg | Freq: Once | INTRAMUSCULAR | Status: AC
  Administered 2014-05-19: 25 mg via INTRAVENOUS

## 2014-05-19 MED ORDER — MIDAZOLAM HCL 2 MG/2ML IJ SOLN
INTRAMUSCULAR | Status: AC
  Filled 2014-05-19: qty 2

## 2014-05-19 MED ORDER — MIDAZOLAM HCL 2 MG/2ML IJ SOLN
INTRAMUSCULAR | Status: DC | PRN
  Administered 2014-05-19: 2 mg via INTRAVENOUS

## 2014-05-19 MED ORDER — OXYCODONE-ACETAMINOPHEN 5-325 MG PO TABS
ORAL_TABLET | ORAL | Status: AC
  Filled 2014-05-19: qty 1

## 2014-05-19 MED ORDER — BUPIVACAINE HCL (PF) 0.5 % IJ SOLN
INTRAMUSCULAR | Status: AC
  Filled 2014-05-19: qty 30

## 2014-05-19 MED ORDER — SUCCINYLCHOLINE CHLORIDE 20 MG/ML IJ SOLN
INTRAMUSCULAR | Status: DC | PRN
  Administered 2014-05-19: 120 mg via INTRAVENOUS

## 2014-05-19 MED ORDER — LIDOCAINE HCL (CARDIAC) 10 MG/ML IV SOLN
INTRAVENOUS | Status: DC | PRN
  Administered 2014-05-19: 10 mg via INTRAVENOUS

## 2014-05-19 MED ORDER — HYDROMORPHONE HCL PF 1 MG/ML IJ SOLN
1.0000 mg | Freq: Once | INTRAMUSCULAR | Status: AC
  Administered 2014-05-19: 1 mg via INTRAVENOUS
  Filled 2014-05-19: qty 1

## 2014-05-19 MED ORDER — GLYCOPYRROLATE 0.2 MG/ML IJ SOLN
INTRAMUSCULAR | Status: DC | PRN
  Administered 2014-05-19: 0.6 mg via INTRAVENOUS

## 2014-05-19 MED ORDER — DIPHENHYDRAMINE HCL 50 MG/ML IJ SOLN
INTRAMUSCULAR | Status: AC
  Administered 2014-05-19: 25 mg via INTRAVENOUS
  Filled 2014-05-19: qty 1

## 2014-05-19 SURGICAL SUPPLY — 45 items
BAG HAMPER (MISCELLANEOUS) ×2 IMPLANT
BAG SPEC RTRVL LRG 6X4 10 (ENDOMECHANICALS) ×1
CLOTH BEACON ORANGE TIMEOUT ST (SAFETY) ×2 IMPLANT
COVER LIGHT HANDLE STERIS (MISCELLANEOUS) ×5 IMPLANT
CUTTER FLEX LINEAR 45M (STAPLE) IMPLANT
CUTTER LINEAR ENDO 35 ETS (STAPLE) IMPLANT
CUTTER LINEAR ENDO 35 ETS TH (STAPLE) ×1 IMPLANT
DECANTER SPIKE VIAL GLASS SM (MISCELLANEOUS) ×2 IMPLANT
DISSECTOR BLUNT TIP ENDO 5MM (MISCELLANEOUS) IMPLANT
DURAPREP 26ML APPLICATOR (WOUND CARE) ×2 IMPLANT
ELECT REM PT RETURN 9FT ADLT (ELECTROSURGICAL) ×2
ELECTRODE REM PT RTRN 9FT ADLT (ELECTROSURGICAL) ×1 IMPLANT
FILTER SMOKE EVAC LAPAROSHD (FILTER) ×2 IMPLANT
FORMALIN 10 PREFIL 120ML (MISCELLANEOUS) ×2 IMPLANT
GLOVE SURG SS PI 7.5 STRL IVOR (GLOVE) ×4 IMPLANT
GOWN STRL REUS W/TWL LRG LVL3 (GOWN DISPOSABLE) ×4 IMPLANT
INST SET LAPROSCOPIC AP (KITS) ×2 IMPLANT
IV NS IRRIG 3000ML ARTHROMATIC (IV SOLUTION) IMPLANT
KIT ROOM TURNOVER APOR (KITS) ×2 IMPLANT
LIGASURE 5MM LAPAROSCOPIC (INSTRUMENTS) ×1 IMPLANT
MANIFOLD NEPTUNE II (INSTRUMENTS) ×2 IMPLANT
NDL INSUFFLATION 14GA 120MM (NEEDLE) ×1 IMPLANT
NEEDLE INSUFFLATION 14GA 120MM (NEEDLE) ×2 IMPLANT
NS IRRIG 1000ML POUR BTL (IV SOLUTION) ×2 IMPLANT
PACK LAP CHOLE LZT030E (CUSTOM PROCEDURE TRAY) ×2 IMPLANT
PAD ARMBOARD 7.5X6 YLW CONV (MISCELLANEOUS) ×2 IMPLANT
POUCH SPECIMEN RETRIEVAL 10MM (ENDOMECHANICALS) ×2 IMPLANT
RELOAD /EVU35 (ENDOMECHANICALS) IMPLANT
RELOAD 45 VASCULAR/THIN (ENDOMECHANICALS) IMPLANT
RELOAD CUTTER ETS 35MM STAND (ENDOMECHANICALS) IMPLANT
RELOAD STAPLE 45 2.5 WHT GRN (ENDOMECHANICALS) IMPLANT
SCALPEL HARMONIC ACE (MISCELLANEOUS) ×2 IMPLANT
SET BASIN LINEN APH (SET/KITS/TRAYS/PACK) ×2 IMPLANT
SET TUBE IRRIG SUCTION NO TIP (IRRIGATION / IRRIGATOR) IMPLANT
SPONGE GAUZE 2X2 8PLY STRL LF (GAUZE/BANDAGES/DRESSINGS) ×6 IMPLANT
STAPLER VISISTAT (STAPLE) ×2 IMPLANT
SUT VICRYL 0 UR6 27IN ABS (SUTURE) ×2 IMPLANT
TAPE CLOTH SURG 4X10 WHT LF (GAUZE/BANDAGES/DRESSINGS) ×1 IMPLANT
TRAY FOLEY CATH 16FR SILVER (SET/KITS/TRAYS/PACK) ×2 IMPLANT
TROCAR ENDO BLADELESS 11MM (ENDOMECHANICALS) ×2 IMPLANT
TROCAR ENDO BLADELESS 12MM (ENDOMECHANICALS) ×2 IMPLANT
TROCAR XCEL NON-BLD 5MMX100MML (ENDOMECHANICALS) ×2 IMPLANT
TUBING INSUFFLATION (TUBING) ×2 IMPLANT
WARMER LAPAROSCOPE (MISCELLANEOUS) ×2 IMPLANT
YANKAUER SUCT 12FT TUBE ARGYLE (SUCTIONS) ×2 IMPLANT

## 2014-05-19 NOTE — ED Notes (Signed)
Redness, rash above iv site better, CRNA staff at bedside,

## 2014-05-19 NOTE — H&P (Signed)
Robin Bender is an 38 y.o. female.   Chief Complaint: Abdominal pain HPI: Robin Bender is a 38 year old white female who less than 24 hours earlier developed lower abdominal pain. She presented emergency room for further evaluation treatment. CT scan the abdomen reveals early acute appendicitis without evidence of perforation.  Past Medical History  Diagnosis Date  . Hypertension   . Anemia     Past Surgical History  Procedure Laterality Date  . Cesarean section    . Abdominal hysterectomy    . Carpal tunnel release    . Ovarian cyst removed      Family History  Problem Relation Age of Onset  . Cancer Mother     cervical  . Hypertension Mother   . Heart disease Mother    Social History:  reports that she has been smoking Cigarettes.  She has a 5.4 pack-year smoking history. She does not have any smokeless tobacco history on file. She reports that she does not drink alcohol or use illicit drugs.  Allergies:  Allergies  Allergen Reactions  . Ampicillin-Sulbactam Sodium     Rash, redness noted around iv site,   . Codeine Other (See Comments)    Blisters around mouth  . Demerol Hives     (Not in a hospital admission)  Results for orders placed during the hospital encounter of 05/19/14 (from the past 48 hour(s))  URINALYSIS, ROUTINE W REFLEX MICROSCOPIC     Status: Abnormal   Collection Time    05/19/14  4:14 AM      Result Value Ref Range   Color, Urine YELLOW  YELLOW   APPearance CLEAR  CLEAR   Specific Gravity, Urine >1.030 (*) 1.005 - 1.030   pH 5.5  5.0 - 8.0   Glucose, UA NEGATIVE  NEGATIVE mg/dL   Hgb urine dipstick NEGATIVE  NEGATIVE   Bilirubin Urine NEGATIVE  NEGATIVE   Ketones, ur NEGATIVE  NEGATIVE mg/dL   Protein, ur NEGATIVE  NEGATIVE mg/dL   Urobilinogen, UA 0.2  0.0 - 1.0 mg/dL   Nitrite NEGATIVE  NEGATIVE   Leukocytes, UA NEGATIVE  NEGATIVE   Comment: MICROSCOPIC NOT DONE ON URINES WITH NEGATIVE PROTEIN, BLOOD, LEUKOCYTES, NITRITE, OR GLUCOSE <1000  mg/dL.  CBC WITH DIFFERENTIAL     Status: Abnormal   Collection Time    05/19/14  4:53 AM      Result Value Ref Range   WBC 15.1 (*) 4.0 - 10.5 K/uL   RBC 4.66  3.87 - 5.11 MIL/uL   Hemoglobin 14.1  12.0 - 15.0 g/dL   HCT 41.0  36.0 - 46.0 %   MCV 88.0  78.0 - 100.0 fL   MCH 30.3  26.0 - 34.0 pg   MCHC 34.4  30.0 - 36.0 g/dL   RDW 13.5  11.5 - 15.5 %   Platelets 253  150 - 400 K/uL   Neutrophils Relative % 74  43 - 77 %   Neutro Abs 11.1 (*) 1.7 - 7.7 K/uL   Lymphocytes Relative 17  12 - 46 %   Lymphs Abs 2.6  0.7 - 4.0 K/uL   Monocytes Relative 7  3 - 12 %   Monocytes Absolute 1.0  0.1 - 1.0 K/uL   Eosinophils Relative 2  0 - 5 %   Eosinophils Absolute 0.4  0.0 - 0.7 K/uL   Basophils Relative 0  0 - 1 %   Basophils Absolute 0.0  0.0 - 0.1 K/uL  COMPREHENSIVE METABOLIC PANEL  Status: Abnormal   Collection Time    05/19/14  4:53 AM      Result Value Ref Range   Sodium 141  137 - 147 mEq/L   Potassium 3.9  3.7 - 5.3 mEq/L   Chloride 105  96 - 112 mEq/L   CO2 22  19 - 32 mEq/L   Glucose, Bld 118 (*) 70 - 99 mg/dL   BUN 11  6 - 23 mg/dL   Creatinine, Ser 0.57  0.50 - 1.10 mg/dL   Calcium 9.0  8.4 - 10.5 mg/dL   Total Protein 6.8  6.0 - 8.3 g/dL   Albumin 3.5  3.5 - 5.2 g/dL   AST 17  0 - 37 U/L   ALT 23  0 - 35 U/L   Alkaline Phosphatase 70  39 - 117 U/L   Total Bilirubin 0.4  0.3 - 1.2 mg/dL   GFR calc non Af Amer >90  >90 mL/min   GFR calc Af Amer >90  >90 mL/min   Comment: (NOTE)     The eGFR has been calculated using the CKD EPI equation.     This calculation has not been validated in all clinical situations.     eGFR's persistently <90 mL/min signify possible Chronic Kidney     Disease.   Anion gap 14  5 - 15  LIPASE, BLOOD     Status: None   Collection Time    05/19/14  4:53 AM      Result Value Ref Range   Lipase 36  11 - 59 U/L   Ct Abdomen Pelvis W Contrast  05/19/2014   CLINICAL DATA:  Right lower abdominal pain. Low back pain. White cell count 15.1.   EXAM: CT ABDOMEN AND PELVIS WITH CONTRAST  TECHNIQUE: Multidetector CT imaging of the abdomen and pelvis was performed using the standard protocol following bolus administration of intravenous contrast.  CONTRAST:  7mL OMNIPAQUE IOHEXOL 300 MG/ML SOLN, 14mL OMNIPAQUE IOHEXOL 300 MG/ML SOLN  COMPARISON:  02/01/2008  FINDINGS: Lung bases are clear.  Diffuse fatty infiltration of the liver. No focal lesions. Gallbladder, spleen, pancreas, adrenal glands, kidneys, abdominal aorta, inferior vena cava, and retroperitoneal lymph nodes are unremarkable. Stomach, small bowel, and colon are normal for degree of distention. Stool fills the colon. Small umbilical hernia containing fat. No free air or free fluid in the abdomen.  Pelvis: The appendix is mildly distended at about 8 mm diameter. There is mild periappendiceal fat stranding. This suggest early appendicitis. No evidence of abscess. No pelvic mass or lymphadenopathy. Bladder wall is not thickened. No evidence of diverticulitis. No destructive bone lesions.  IMPRESSION: Changes of early appendicitis without abscess.   Electronically Signed   By: Lucienne Capers M.D.   On: 05/19/2014 06:33    Review of Systems  Constitutional: Positive for malaise/fatigue.  HENT: Negative.   Eyes: Negative.   Respiratory: Negative.   Cardiovascular: Negative.   Gastrointestinal: Positive for nausea and abdominal pain.  Genitourinary: Negative.   Musculoskeletal: Negative.   Skin: Negative.   Neurological: Negative.     Blood pressure 101/69, pulse 86, temperature 98 F (36.7 C), temperature source Oral, resp. rate 20, height $RemoveBe'5\' 3"'[id number]$  (1.6 m), weight 122.471 kg (270 lb), SpO2 95.00%. Physical Exam  Vitals reviewed. Constitutional: She is oriented to person, place, and time. She appears well-developed and well-nourished.  Morbidly obese  HENT:  Head: Normocephalic and atraumatic.  Neck: Normal range of motion. Neck supple.  Cardiovascular: Normal rate, regular  rhythm and normal heart sounds.   Respiratory: Effort normal and breath sounds normal.  GI: Soft. Bowel sounds are normal. She exhibits no distension. There is tenderness.  10 on the right lower quadrant to deep palpation. No rigidity is noted.  Neurological: She is alert and oriented to person, place, and time.  Skin: Skin is warm and dry.     Assessment/Plan Impression: Acute appendicitis Plan: We'll take to the operating room for laparoscopic appendectomy. The risks and benefits of the procedure including bleeding, infection, and the possibility of an open procedure were fully explained to the Robin Bender, who gave informed consent.  Kelisha Dall A 05/19/2014, 8:29 AM

## 2014-05-19 NOTE — ED Notes (Signed)
Dr Jenkin's at bedside,  

## 2014-05-19 NOTE — ED Notes (Signed)
[facility],

## 2014-05-19 NOTE — Anesthesia Postprocedure Evaluation (Signed)
  Anesthesia Post-op Note  Patient: Gregary SignsBrandi I Korver  Procedure(s) Performed: Procedure(s): APPENDECTOMY LAPAROSCOPIC (N/A)  Patient Location: PACU  Anesthesia Type:General  Level of Consciousness: awake and patient cooperative  Airway and Oxygen Therapy: Patient Spontanous Breathing  Post-op Pain: none  Post-op Assessment: Post-op Vital signs reviewed, Patient's Cardiovascular Status Stable, Respiratory Function Stable, Patent Airway, Adequate PO intake and Pain level controlled  Post-op Vital Signs: Reviewed and stable  Last Vitals:  Filed Vitals:   05/19/14 1115  BP: 102/68  Pulse: 80  Temp: 36.7 C  Resp: 19    Complications: No apparent anesthesia complications

## 2014-05-19 NOTE — Anesthesia Procedure Notes (Signed)
Procedure Name: Intubation Date/Time: 05/19/2014 8:59 AM Performed by: Franco NonesYATES, Asar Evilsizer S Pre-anesthesia Checklist: Patient identified, Patient being monitored, Timeout performed, Emergency Drugs available and Suction available Patient Re-evaluated:Patient Re-evaluated prior to inductionOxygen Delivery Method: Circle System Utilized Preoxygenation: Pre-oxygenation with 100% oxygen Intubation Type: IV induction, Rapid sequence and Cricoid Pressure applied Laryngoscope Size: Miller and 2 Grade View: Grade I Tube type: Oral Tube size: 7.0 mm Number of attempts: 1 Airway Equipment and Method: stylet Placement Confirmation: ETT inserted through vocal cords under direct vision,  positive ETCO2 and breath sounds checked- equal and bilateral Secured at: 21 cm Tube secured with: Tape Dental Injury: Teeth and Oropharynx as per pre-operative assessment

## 2014-05-19 NOTE — Op Note (Signed)
Patient:  Robin Bender  DOB:  11-Oct-1976  MRN:  [id number]   Preop Diagnosis:  Acute appendicitis  Postop Diagnosis:  Same  Procedure:  Laparoscopic appendectomy  Surgeon:  Franky MachoMark Jak Haggar, M.D.  Anes:  General endotracheal  Indications:  Patient is a 38 year old morbidly obese white female who presents with right lower quadrant abdominal pain for less than 24 hours. CT scan the abdomen reveals acute appendicitis. The risks and benefits of the procedure including bleeding, infection, hepatobiliary injury, and the possibility of an open procedure were fully explained to the patient, who gave informed consent.  Procedure note:  The patient is placed the supine position. After induction of general endotracheal anesthesia, the abdomen was prepped and draped using usual sterile technique with DuraPrep. Surgical site confirmation was performed.  A supraumbilical incision was made down to the fascia. A Veress needle was introduced into the abdominal cavity and confirmation of placement was done using the saline drop test. The abdomen was then insufflated to 16 mm mercury pressure. An 11 mm trocar was introduced into the abdominal cavity under direct visualization without difficulty. The patient was placed in deeper Trendelenburg position and an additional 12 mm trocar was placed left lower quadrant region. After freeing way adhesions along the lower midline of the abdominal wall, a 12 mm trocar was introduced into the abdominal cavity under direct visualization. The appendix was visualized and the distal half was noted to be inflamed. There was no evidence of perforation. The mesoappendix was divided using the LigaSure. A vascular Endo GIA was placed across the base the appendix and fired. The appendix was then removed using an Endo Catch bag without difficulty. The staple line was inspected and noted to be within normal limits. All fluid and air were then evacuated from the abdominal cavity higher to  removal of the trochars.  All wounds were irrigated with normal saline. All wounds were injected with 0.5% Sensorcaine. The skin incisions were closed using staples. Betadine ointment and dry sterile dressings were applied.  All tape and needle counts were correct at the end of the procedure. Patient was extubated in the operating room and transferred back to stable condition.  Complications:  None  EBL:  Minimal  Specimen:  Appendix

## 2014-05-19 NOTE — ED Notes (Signed)
Pt c/o lower abdominal pain,back pain, and vaginal pain since earlier today. Pt thought she was constipated and took Miralax without relief. Pt c/o nausea.

## 2014-05-19 NOTE — Transfer of Care (Signed)
Immediate Anesthesia Transfer of Care Note  Patient: Robin Bender  Procedure(s) Performed: Procedure(s): APPENDECTOMY LAPAROSCOPIC (N/A)  Patient Location: PACU  Anesthesia Type:General  Level of Consciousness: awake and patient cooperative  Airway & Oxygen Therapy: Patient Spontanous Breathing and non-rebreather face mask  Post-op Assessment: Report given to PACU RN, Post -op Vital signs reviewed and stable and Patient moving all extremities  Post vital signs: Reviewed and stable  Complications: No apparent anesthesia complications

## 2014-05-19 NOTE — Anesthesia Postprocedure Evaluation (Signed)
  Anesthesia Post-op Note  Patient: Robin Bender  Procedure(s) Performed: Procedure(s): APPENDECTOMY LAPAROSCOPIC (N/A)  Patient Location: PACU  Anesthesia Type:General  Level of Consciousness: awake and patient cooperative  Airway and Oxygen Therapy: Patient Spontanous Breathing  Post-op Pain: none  Post-op Assessment: Post-op Vital signs reviewed, Patient's Cardiovascular Status Stable, Respiratory Function Stable and No signs of Nausea or vomiting  Post-op Vital Signs: Reviewed and stable  Last Vitals:  Filed Vitals:   05/19/14 1045  BP: 110/70  Pulse: 80  Temp:   Resp: 24    Complications: No apparent anesthesia complications

## 2014-05-19 NOTE — ED Provider Notes (Signed)
CSN: [id number]     Arrival date & time 05/19/14  0346 History   First MD Initiated Contact with Patient 05/19/14 0413     Chief Complaint  Patient presents with  . Abdominal Pain     (Consider location/radiation/quality/duration/timing/severity/associated sxs/prior Treatment) HPI Comments: Pt is obese 38 y/o female with hx of C section X 2 and complete hysterectomy - she developed lower abd pain radiating to the back and the groin that started earlier today prompting her to take a laxative (miralax) thinking that she may be constipation - she has not missed her regular BM's nor have they been irregularly hard and no blood in stool, no vomitin and no f/c.  She has had very little gas today, excessive burping.  Her pain is a cramping, nothing makes better or worse, is intermittent but gradually worsening.    Patient is a [age] y.o. female presenting with abdominal pain. The history is provided by the patient and the spouse.  Abdominal Pain   Past Medical History  Diagnosis Date  . Hypertension   . Anemia    Past Surgical History  Procedure Laterality Date  . Cesarean section    . Abdominal hysterectomy    . Carpal tunnel release    . Ovarian cyst removed     Family History  Problem Relation Age of Onset  . Cancer Mother     cervical  . Hypertension Mother   . Heart disease Mother    History  Substance Use Topics  . Smoking status: Current Every Day Smoker -- 0.30 packs/day for 18 years    Types: Cigarettes  . Smokeless tobacco: Not on file  . Alcohol Use: No   OB History   Grav Para Term Preterm Abortions TAB SAB Ect Mult Living   2 2 2       2      Review of Systems  Gastrointestinal: Positive for abdominal pain.  All other systems reviewed and are negative.     Allergies  Codeine and Demerol  Home Medications   Prior to Admission medications   Not on File   BP 101/69  Pulse 86  Temp(Src) 98 F (36.7 C) (Oral)  Resp 20  Ht 5\' 3"  (1.6 m)  Wt 270 lb  (122.471 kg)  BMI 47.84 kg/m2  SpO2 95% Physical Exam  Nursing note and vitals reviewed. Constitutional: She appears well-developed and well-nourished. No distress.  HENT:  Head: Normocephalic and atraumatic.  Mouth/Throat: Oropharynx is clear and moist. No oropharyngeal exudate.  Eyes: Conjunctivae and EOM are normal. Pupils are equal, round, and reactive to light. Right eye exhibits no discharge. Left eye exhibits no discharge. No scleral icterus.  Neck: Normal range of motion. Neck supple. No JVD present. No thyromegaly present.  Cardiovascular: Normal rate, regular rhythm, normal heart sounds and intact distal pulses.  Exam reveals no gallop and no friction rub.   No murmur heard. Pulmonary/Chest: Effort normal and breath sounds normal. No respiratory distress. She has no wheezes. She has no rales.  Abdominal: Soft. She exhibits no distension and no mass. There is tenderness.  No BS, diffuse ttp but worse in RUQ and RLQ - has some LUQ ttp as well.  Epigastrium also ttp.  No peritoneal signs, obese but no obvious mass or pulsations.  Genitourinary:  No hernias in inguinal or femoral region  Musculoskeletal: Normal range of motion. She exhibits no edema and no tenderness.  Lymphadenopathy:    She has no cervical adenopathy.  Neurological: She  is alert. Coordination normal.  Skin: Skin is warm and dry. No rash noted. No erythema.  Psychiatric: She has a normal mood and affect. Her behavior is normal.    ED Course  Procedures (including critical care time) Labs Review Labs Reviewed  URINALYSIS, ROUTINE W REFLEX MICROSCOPIC - Abnormal; Notable for the following:    Specific Gravity, Urine >1.030 (*)    All other components within normal limits  CBC WITH DIFFERENTIAL - Abnormal; Notable for the following:    WBC 15.1 (*)    Neutro Abs 11.1 (*)    All other components within normal limits  COMPREHENSIVE METABOLIC PANEL - Abnormal; Notable for the following:    Glucose, Bld 118 (*)     All other components within normal limits  LIPASE, BLOOD    Imaging Review Ct Abdomen Pelvis W Contrast  05/19/2014   CLINICAL DATA:  Right lower abdominal pain. Low back pain. White cell count 15.1.  EXAM: CT ABDOMEN AND PELVIS WITH CONTRAST  TECHNIQUE: Multidetector CT imaging of the abdomen and pelvis was performed using the standard protocol following bolus administration of intravenous contrast.  CONTRAST:  50mL OMNIPAQUE IOHEXOL 300 MG/ML SOLN, OMNIPAQUE IOHEXOL 300 MG/ML SOLN  COMPARISON:  02/01/2008  FINDINGS: Lung bases are clear.  Diffuse fatty infiltration of the liver. No focal lesions. Gallbladder, spleen, pancreas, adrenal glands, kidneys, abdominal aorta, inferior vena cava, and retroperitoneal lymph nodes are unremarkable. Stomach, small bowel, and colon are normal for degree of distention. Stool fills the colon. Small umbilical hernia containing fat. No free air or free fluid in the abdomen.  Pelvis: The appendix is mildly distended at about 8 mm diameter. There is mild periappendiceal fat stranding. This suggest early appendicitis. No evidence of abscess. No pelvic mass or lymphadenopathy. Bladder wall is not thickened. No evidence of diverticulitis. No destructive bone lesions.  IMPRESSION: Changes of early appendicitis without abscess.   Electronically Signed   By: Burman Nieves M.D.   On: 05/19/2014 06:33     EKG Interpretation   Date/Time:  Sunday May 19 2014 07:10:50 EDT Ventricular Rate:  93 PR Interval:  148 QRS Duration: 96 QT Interval:  361 QTC Calculation: 449 R Axis:   64 Text Interpretation:  Sinus rhythm Low voltage, precordial leads Normal  ECG since last tracing no significant change Confirmed by Ralpheal Zappone  MD,  Lionell Matuszak ([id number]) on 05/19/2014 7:33:55 AM      MDM   Final diagnoses:  Acute appendicitis, unspecified acute appendicitis type    Pt has abd ttp of unclear etiology - she will benefit from labs and imaging to eval for SBO, chole, panc,  appy.  Labs to r/o UTI as well.  Pain meds ordered  Labs show leukocytosis and CT confirms appendicitis - d/w Dr. Lovell Sheehan who will see pt for admission.  preop ECG ordered, npo verified and abx ordered.  Pt informed of results.  Meds given in ED:  Medications  Ampicillin-Sulbactam (UNASYN) 3 g in sodium chloride 0.9 % 100 mL IVPB (3 g Intravenous New Bag/Given 05/19/14 0715)  diphenhydrAMINE (BENADRYL) injection 25 mg (not administered)  HYDROmorphone (DILAUDID) injection 1 mg (1 mg Intravenous Given 05/19/14 0509)  ondansetron (ZOFRAN) injection 4 mg (4 mg Intravenous Given 05/19/14 0508)  sodium chloride 0.9 % bolus 1,000 mL (0 mLs Intravenous Stopped 05/19/14 0650)  iohexol (OMNIPAQUE) 300 MG/ML solution 50 mL (50 mLs Oral Contrast Given 05/19/14 0531)  HYDROmorphone (DILAUDID) injection 0.5 mg (0.5 mg Intravenous Given 05/19/14 0641)  iohexol (OMNIPAQUE)  300 MG/ML solution 100 mL (100 mLs Intravenous Contrast Given 05/19/14 0617)    New Prescriptions   No medications on file        Vida Roller, MD 05/19/14 [phone]

## 2014-05-19 NOTE — ED Notes (Signed)
Pt transported to or,

## 2014-05-19 NOTE — ED Notes (Signed)
Went into pt's room, pt c/o "burning" above iv site, pt has rash and  Redness, swelling  noted above iv site, pt alert, denies any itching, antibiotic stopped, iv site able to be flushed with no problems, blood able to be drawn back with no problems. Dr Hyacinth MeekerMiller notified, additional orders given,

## 2014-05-19 NOTE — Discharge Instructions (Signed)
Laparoscopic Appendectomy °Care After °Refer to this sheet in the next few weeks. These instructions provide you with information on caring for yourself after your procedure. Your caregiver may also give you more specific instructions. Your treatment has been planned according to current medical practices, but problems sometimes occur. Call your caregiver if you have any problems or questions after your procedure. °HOME CARE INSTRUCTIONS °· Do not drive while taking narcotic pain medicines. °· Use stool softener if you become constipated from your pain medicines. °· Change your bandages (dressings) as directed. °· Keep your wounds clean and dry. You may wash the wounds gently with soap and water. Gently pat the wounds dry with a clean towel. °· Do not take baths, swim, or use hot tubs for 10 days, or as instructed by your caregiver. °· Only take over-the-counter or prescription medicines for pain, discomfort, or fever as directed by your caregiver. °· You may continue your normal diet as directed. °· Do not lift more than 10 pounds (4.5 kg) or play contact sports for 3 weeks, or as directed. °· Slowly increase your activity after surgery. °· Take deep breaths to avoid getting a lung infection (pneumonia). °SEEK MEDICAL CARE IF: °· You have redness, swelling, or increasing pain in your wounds. °· You have pus coming from your wounds. °· You have drainage from a wound that lasts longer than 1 day. °· You notice a bad smell coming from the wounds or dressing. °· Your wound edges break open after stitches (sutures) have been removed. °· You notice increasing pain in the shoulders (shoulder strap areas) or near your shoulder blades. °· You develop dizzy episodes or fainting while standing. °· You develop shortness of breath. °· You develop persistent nausea or vomiting. °· You cannot control your bowel functions or lose your appetite. °· You develop diarrhea. °SEEK IMMEDIATE MEDICAL CARE IF:  °· You have a fever. °· You  develop a rash. °· You have difficulty breathing or sharp pains in your chest. °· You develop any reaction or side effects to medicines given. °MAKE SURE YOU: °· Understand these instructions. °· Will watch your condition. °· Will get help right away if you are not doing well or get worse. °Document Released: 10/25/2005 Document Revised: 01/17/2012 Document Reviewed: 05/04/2011 °ExitCare® Patient Information ©2015 [employer]. This information is not intended to replace advice given to you by your health care provider. Make sure you discuss any questions you have with your health care provider. ° °

## 2014-05-19 NOTE — Anesthesia Preprocedure Evaluation (Signed)
Anesthesia Evaluation  Patient identified by MRN, date of birth, ID band Patient awake    Reviewed: Allergy & Precautions, H&P , NPO status , Patient's Chart, lab work & pertinent test results  Airway Mallampati: II TM Distance: >3 FB Neck ROM: Full    Dental  (+) Teeth Intact, Dental Advisory Given   Pulmonary Current Smoker,  Less than 1/2 pack per day   Pulmonary exam normal       Cardiovascular Rhythm:Regular     Neuro/Psych    GI/Hepatic   Endo/Other    Renal/GU      Musculoskeletal   Abdominal (+) - obese,   Peds  Hematology   Anesthesia Other Findings   Reproductive/Obstetrics                           Anesthesia Physical Anesthesia Plan  ASA: I and emergent  Anesthesia Plan: General   Post-op Pain Management:    Induction: Intravenous, Rapid sequence and Cricoid pressure planned  Airway Management Planned: Oral ETT  Additional Equipment:   Intra-op Plan:   Post-operative Plan: Extubation in OR  Informed Consent:   Dental advisory given  Plan Discussed with: Anesthesiologist and Surgeon  Anesthesia Plan Comments:         Anesthesia Quick Evaluation

## 2014-05-20 ENCOUNTER — Encounter (HOSPITAL_COMMUNITY): Payer: Self-pay | Admitting: General Surgery

## 2014-05-24 ENCOUNTER — Ambulatory Visit: Payer: Self-pay | Admitting: General Practice

## 2014-05-27 ENCOUNTER — Other Ambulatory Visit: Payer: Self-pay | Admitting: Obstetrics and Gynecology

## 2014-05-31 ENCOUNTER — Encounter: Payer: Self-pay | Admitting: General Practice

## 2014-05-31 ENCOUNTER — Ambulatory Visit (INDEPENDENT_AMBULATORY_CARE_PROVIDER_SITE_OTHER): Payer: BC Managed Care – PPO | Admitting: General Practice

## 2014-05-31 VITALS — BP 124/94 | HR 112 | Temp 97.3°F | Ht 63.0 in | Wt 269.6 lb

## 2014-05-31 DIAGNOSIS — M170 Bilateral primary osteoarthritis of knee: Secondary | ICD-10-CM

## 2014-05-31 DIAGNOSIS — M25559 Pain in unspecified hip: Secondary | ICD-10-CM

## 2014-05-31 DIAGNOSIS — IMO0002 Reserved for concepts with insufficient information to code with codable children: Secondary | ICD-10-CM

## 2014-05-31 DIAGNOSIS — M545 Low back pain, unspecified: Secondary | ICD-10-CM

## 2014-05-31 DIAGNOSIS — M1710 Unilateral primary osteoarthritis, unspecified knee: Secondary | ICD-10-CM

## 2014-05-31 MED ORDER — MELOXICAM 7.5 MG PO TABS: 7.5000 mg | ORAL_TABLET | Freq: Every day | ORAL | Status: DC

## 2014-05-31 NOTE — Patient Instructions (Signed)
Arthritis, Nonspecific °Arthritis is inflammation of a joint. This usually means pain, redness, warmth or swelling are present. One or more joints may be involved. There are a number of types of arthritis. Your caregiver may not be able to tell what type of arthritis you have right away. °CAUSES  °The most common cause of arthritis is the wear and tear on the joint (osteoarthritis). This causes damage to the cartilage, which can break down over time. The knees, hips, back and neck are most often affected by this type of arthritis. °Other types of arthritis and common causes of joint pain include: °· Sprains and other injuries near the joint. Sometimes minor sprains and injuries cause pain and swelling that develop hours later. °· Rheumatoid arthritis. This affects hands, feet and knees. It usually affects both sides of your body at the same time. It is often associated with chronic ailments, fever, weight loss and general weakness. °· Crystal arthritis. Gout and pseudo gout can cause occasional acute severe pain, redness and swelling in the foot, ankle, or knee. °· Infectious arthritis. Bacteria can get into a joint through a break in overlying skin. This can cause infection of the joint. Bacteria and viruses can also spread through the blood and affect your joints. °· Drug, infectious and allergy reactions. Sometimes joints can become mildly painful and slightly swollen with these types of illnesses. °SYMPTOMS  °· Pain is the main symptom. °· Your joint or joints can also be red, swollen and warm or hot to the touch. °· You may have a fever with certain types of arthritis, or even feel overall ill. °· The joint with arthritis will hurt with movement. Stiffness is present with some types of arthritis. °DIAGNOSIS  °Your caregiver will suspect arthritis based on your description of your symptoms and on your exam. Testing may be needed to find the type of arthritis: °· Blood and sometimes urine tests. °· X-ray tests  and sometimes CT or MRI scans. °· Removal of fluid from the joint (arthrocentesis) is done to check for bacteria, crystals or other causes. Your caregiver (or a specialist) will numb the area over the joint with a local anesthetic, and use a needle to remove joint fluid for examination. This procedure is only minimally uncomfortable. °· Even with these tests, your caregiver may not be able to tell what kind of arthritis you have. Consultation with a specialist (rheumatologist) may be helpful. °TREATMENT  °Your caregiver will discuss with you treatment specific to your type of arthritis. If the specific type cannot be determined, then the following general recommendations may apply. °Treatment of severe joint pain includes: °· Rest. °· Elevation. °· Anti-inflammatory medication (for example, ibuprofen) may be prescribed. Avoiding activities that cause increased pain. °· Only take over-the-counter or prescription medicines for pain and discomfort as recommended by your caregiver. °· Cold packs over an inflamed joint may be used for 10 to 15 minutes every hour. Hot packs sometimes feel better, but do not use overnight. Do not use hot packs if you are diabetic without your caregiver's permission. °· A cortisone shot into arthritic joints may help reduce pain and swelling. °· Any acute arthritis that gets worse over the next 1 to 2 days needs to be looked at to be sure there is no joint infection. °Long-term arthritis treatment involves modifying activities and lifestyle to reduce joint stress jarring. This can include weight loss. Also, exercise is needed to nourish the joint cartilage and remove waste. This helps keep the muscles   around the joint strong. °HOME CARE INSTRUCTIONS  °· Do not take aspirin to relieve pain if gout is suspected. This elevates uric acid levels. °· Only take over-the-counter or prescription medicines for pain, discomfort or fever as directed by your caregiver. °· Rest the joint as much as  possible. °· If your joint is swollen, keep it elevated. °· Use crutches if the painful joint is in your leg. °· Drinking plenty of fluids may help for certain types of arthritis. °· Follow your caregiver's dietary instructions. °· Try low-impact exercise such as: °¨ Swimming. °¨ Water aerobics. °¨ Biking. °¨ Walking. °· Morning stiffness is often relieved by a warm shower. °· Put your joints through regular range-of-motion. °SEEK MEDICAL CARE IF:  °· You do not feel better in 24 hours or are getting worse. °· You have side effects to medications, or are not getting better with treatment. °SEEK IMMEDIATE MEDICAL CARE IF:  °· You have a fever. °· You develop severe joint pain, swelling or redness. °· Many joints are involved and become painful and swollen. °· There is severe back pain and/or leg weakness. °· You have loss of bowel or bladder control. °Document Released: 12/02/2004 Document Revised: 01/17/2012 Document Reviewed: 12/18/2008 °ExitCare® Patient Information ©2015 [employer]. This information is not intended to replace advice given to you by your health care provider. Make sure you discuss any questions you have with your health care provider. ° °
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
## 2014-06-02 DIAGNOSIS — M170 Bilateral primary osteoarthritis of knee: Secondary | ICD-10-CM | POA: Insufficient documentation

## 2014-06-19 ENCOUNTER — Other Ambulatory Visit: Payer: Self-pay | Admitting: Obstetrics and Gynecology

## 2014-06-19 ENCOUNTER — Telehealth: Payer: Self-pay | Admitting: Obstetrics and Gynecology

## 2014-06-19 NOTE — Telephone Encounter (Signed)
Patient called this a.m., to see if she intends to keep her appt. . Pt informs me that she intends to "reschedule" , as her husband needs surgery. Pt advised to call office to cancel appt

## 2014-07-06 ENCOUNTER — Encounter: Payer: Self-pay | Admitting: General Practice

## 2014-07-06 ENCOUNTER — Ambulatory Visit (INDEPENDENT_AMBULATORY_CARE_PROVIDER_SITE_OTHER): Payer: BC Managed Care – PPO | Admitting: General Practice

## 2014-07-06 VITALS — BP 127/83 | HR 90 | Temp 97.6°F | Ht 63.0 in | Wt 271.8 lb

## 2014-07-06 DIAGNOSIS — H01006 Unspecified blepharitis left eye, unspecified eyelid: Secondary | ICD-10-CM

## 2014-07-06 DIAGNOSIS — H01009 Unspecified blepharitis unspecified eye, unspecified eyelid: Secondary | ICD-10-CM

## 2014-07-06 MED ORDER — AZITHROMYCIN 1 % OP SOLN: OPHTHALMIC | Status: DC

## 2014-07-06 NOTE — Patient Instructions (Signed)
Blepharitis Blepharitis is redness, soreness, and swelling (inflammation) of one or both eyelids. It may be caused by an allergic reaction or a bacterial infection. Blepharitis may also be associated with reddened, scaly skin (seborrhea) of the scalp and eyebrows. While you sleep, eye discharge may cause your eyelashes to stick together. Your eyelids may itch, burn, swell, and may lose their lashes. These will grow back. Your eyes may become sensitive. Blepharitis may recur and need repeated treatment. If this is the case, you may require further evaluation by an eye specialist (ophthalmologist). HOME CARE INSTRUCTIONS   Keep your hands clean.  Use a clean towel each time you dry your eyelids. Do not use this towel to clean other areas. Do not share a towel or makeup with anyone.  Wash your eyelids with warm water or warm water mixed with a small amount of baby shampoo. Do this twice a day or as often as needed.  Wash your face and eyebrows at least once a day.  Use warm compresses 2 times a day for 10 minutes at a time, or as directed by your caregiver.  Apply antibiotic ointment as directed by your caregiver.  Avoid rubbing your eyes.  Avoid wearing makeup until you get better.  Follow up with your caregiver as directed. SEEK IMMEDIATE MEDICAL CARE IF:   You have pain, redness, or swelling that gets worse or spreads to other parts of your face.  Your vision changes, or you have pain when looking at lights or moving objects.  You have a fever.  Your symptoms continue for longer than 2 to 4 days or become worse. MAKE SURE YOU:   Understand these instructions.  Will watch your condition.  Will get help right away if you are not doing well or get worse. Document Released: 10/22/2000 Document Revised: 01/17/2012 Document Reviewed: 12/02/2010 ExitCare Patient Information 2015 [employer]. This information is not intended to replace advice given to you by your health care  provider. Make sure you discuss any questions you have with your health care provider.  

## 2014-07-06 NOTE — Progress Notes (Signed)
   Subjective:    Patient ID: Robin Bender, female    DOB: Oct 30, 1976, 38 y.o.   MRN: [id number]  HPI Patient presents today with complaints of left eye lid swelling, itching, and burning. Onset 2 days ago and gradually worsening. Reports symptoms worse in am. Reports no relieving factors. Denies any known trauma. No OTC medications used. Denies change in vision, headache, or dizziness.      Review of Systems  Constitutional: Negative for fever and chills.  HENT: Negative for congestion, ear pain, postnasal drip, sneezing and sore throat.   Eyes: Positive for itching.  Respiratory: Negative for chest tightness and shortness of breath.   Cardiovascular: Negative for chest pain and palpitations.  Neurological: Negative for dizziness, weakness and headaches.       Objective:   Physical Exam  Constitutional: She is oriented to person, place, and time. She appears well-developed and well-nourished.  HENT:  Head: Atraumatic.  Right Ear: External ear normal.  Left Ear: External ear normal.  Nose: Nose normal.  Mouth/Throat: Oropharynx is clear and moist.  Left upper eyelid swollen, mild erythema, negative drainage noted.  Eyes: Conjunctivae are normal. Pupils are equal, round, and reactive to light.  Cardiovascular: Normal rate, regular rhythm and normal heart sounds.   Pulmonary/Chest: Effort normal and breath sounds normal. No respiratory distress. She exhibits no tenderness.  Neurological: She is alert and oriented to person, place, and time.  Skin: Skin is warm and dry.  Psychiatric: She has a normal mood and affect.          Assessment & Plan:  1. Blepharitis, left - azithromycin (AZASITE) 1 % ophthalmic solution; 1 drop twice daily for two days, then 1 drip daily for next 5 days  Dispense: 2.5 mL; Refill: 1 -Discussed and provided patient education on Blepharitis -Apply warm compresses as directed -RTO if symptoms worsen or unresolved -May seek emergency medical  treatment -Patient verbalized understanding Coralie Keens, FNP-C

## 2014-08-20 ENCOUNTER — Ambulatory Visit (INDEPENDENT_AMBULATORY_CARE_PROVIDER_SITE_OTHER): Payer: BC Managed Care – PPO | Admitting: Family

## 2014-08-20 ENCOUNTER — Encounter: Payer: Self-pay | Admitting: Family

## 2014-08-20 VITALS — BP 125/87 | HR 99 | Temp 97.8°F | Ht 63.0 in | Wt 270.2 lb

## 2014-08-20 DIAGNOSIS — B372 Candidiasis of skin and nail: Secondary | ICD-10-CM

## 2014-08-20 DIAGNOSIS — Z23 Encounter for immunization: Secondary | ICD-10-CM

## 2014-08-20 DIAGNOSIS — R5383 Other fatigue: Secondary | ICD-10-CM

## 2014-08-20 MED ORDER — NYSTATIN-TRIAMCINOLONE 100000-0.1 UNIT/GM-% EX OINT: 1.0000 "application " | TOPICAL_OINTMENT | Freq: Two times a day (BID) | CUTANEOUS | Status: DC

## 2014-08-20 MED ORDER — FLUCONAZOLE 150 MG PO TABS: 150.0000 mg | ORAL_TABLET | Freq: Once | ORAL | Status: DC

## 2014-08-20 NOTE — Progress Notes (Signed)
   Subjective:    Patient ID: Robin Bender, female    DOB: 1976/02/07, 38 y.o.   MRN: [id number]  Rash This is a recurrent problem. The current episode started 1 to 4 weeks ago. The problem has been waxing and waning since onset. The affected locations include the abdomen and groin. The rash is characterized by blistering, pain, redness and scaling. She was exposed to nothing. Pertinent negatives include no diarrhea, fever, joint pain, shortness of breath or vomiting. Past treatments include anti-itch cream ("antifungal"). The treatment provided mild relief. There is no history of asthma or eczema.      Review of Systems  Constitutional: Negative.  Negative for fever.  HENT: Negative.   Eyes: Negative.   Respiratory: Negative.  Negative for shortness of breath.   Cardiovascular: Negative.  Negative for palpitations.  Gastrointestinal: Negative.  Negative for vomiting and diarrhea.  Endocrine: Negative.   Genitourinary: Negative.   Musculoskeletal: Negative.  Negative for joint pain.  Skin: Positive for rash.  Neurological: Negative.  Negative for headaches.  Hematological: Negative.   Psychiatric/Behavioral: Negative.   All other systems reviewed and are negative.      Objective:   Physical Exam  Vitals reviewed. Constitutional: She is oriented to person, place, and time. She appears well-developed and well-nourished. No distress.  Eyes: Pupils are equal, round, and reactive to light.  Neck: Normal range of motion. Neck supple. No thyromegaly present.  Cardiovascular: Normal rate, regular rhythm, normal heart sounds and intact distal pulses.   No murmur heard. Pulmonary/Chest: Effort normal and breath sounds normal. No respiratory distress. She has no wheezes.  Abdominal: Soft. Bowel sounds are normal. She exhibits no distension. There is no tenderness.  Musculoskeletal: Normal range of motion. She exhibits no edema and no tenderness.  Neurological: She is alert and oriented  to person, place, and time. She has normal reflexes. No cranial nerve deficit.  Skin: Skin is warm and dry. Rash noted. There is erythema.  Erythemas generalized dry rash in folds of groin  Psychiatric: She has a normal mood and affect. Her behavior is normal. Judgment and thought content normal.    BP 125/87  Pulse 99  Temp(Src) 97.8 F (36.6 C) (Oral)  Ht 5\' 3"  (1.6 m)  Wt 270 lb 3.2 oz (122.562 kg)  BMI 47.88 kg/m2       Assessment & Plan:  1. Skin yeast infection -Keep clean and dry -Wear cotton underwear - nystatin-triamcinolone ointment (MYCOLOG); Apply 1 application topically 2 (two) times daily.  Dispense: 30 g; Refill: 0 - fluconazole (DIFLUCAN) 150 MG tablet; Take 1 tablet (150 mg total) by mouth once.  Dispense: 1 tablet; Refill: 0  2. Other fatigue - Anemia Profile B   Jannifer Rodneyhristy Rossetta Kama, FNP

## 2014-08-20 NOTE — Patient Instructions (Signed)
Yeast Infection of the Skin Some yeast on the skin is normal, but sometimes it causes an infection. If you have a yeast infection, it shows up as white or light brown patches on brown skin. You can see it better in the summer on tan skin. It causes light-colored holes in your suntan. It can happen on any area of the body. This cannot be passed from person to person. HOME CARE  Scrub your skin daily with a dandruff shampoo. Your rash may take a couple weeks to get well.  Do not scratch or itch the rash. GET HELP RIGHT AWAY IF:   You get another infection from scratching. The skin may get warm, red, and may ooze fluid.  The infection does not seem to be getting better. MAKE SURE YOU:  Understand these instructions.  Will watch your condition.  Will get help right away if you are not doing well or get worse. Document Released: 10/07/2008 Document Revised: 01/17/2012 Document Reviewed: 10/07/2008 ExitCare Patient Information 2015 [employer]. This information is not intended to replace advice given to you by your health care provider. Make sure you discuss any questions you have with your health care provider.  

## 2014-08-21 LAB — ANEMIA PROFILE B
BASOS ABS: 0.1 10*3/uL (ref 0.0–0.2)
Basos: 0 %
EOS ABS: 0.6 10*3/uL — AB (ref 0.0–0.4)
Eos: 5 %
FERRITIN: 83 ng/mL (ref 15–150)
Folate: 11.3 ng/mL (ref >3.0)
HEMATOCRIT: 43.5 % (ref 34.0–46.6)
Hemoglobin: 14.9 g/dL (ref 11.1–15.9)
IMMATURE GRANULOCYTES: 0 %
IRON SATURATION: 20 % (ref 15–55)
Immature Grans (Abs): 0 10*3/uL (ref 0.0–0.1)
Iron: 67 ug/dL (ref 35–155)
LYMPHS: 39 %
Lymphocytes Absolute: 4.6 10*3/uL — ABNORMAL HIGH (ref 0.7–3.1)
MCH: 30 pg (ref 26.6–33.0)
MCHC: 34.3 g/dL (ref 31.5–35.7)
MCV: 88 fL (ref 79–97)
Monocytes Absolute: 1 10*3/uL — ABNORMAL HIGH (ref 0.1–0.9)
Monocytes: 9 %
Neutrophils Absolute: 5.6 10*3/uL (ref 1.4–7.0)
Neutrophils Relative %: 47 %
Platelets: 301 10*3/uL (ref 150–379)
RBC: 4.97 x10E6/uL (ref 3.77–5.28)
RDW: 14.3 % (ref 12.3–15.4)
RETIC CT PCT: 1.5 % (ref 0.6–2.6)
TIBC: 341 ug/dL (ref 250–450)
UIBC: 274 ug/dL (ref 150–375)
Vitamin B-12: 376 pg/mL (ref 211–946)
WBC: 11.9 10*3/uL — AB (ref 3.4–10.8)

## 2014-09-09 ENCOUNTER — Encounter: Payer: Self-pay | Admitting: Family

## 2014-09-22 ENCOUNTER — Encounter (HOSPITAL_COMMUNITY): Payer: Self-pay | Admitting: Cardiology

## 2014-09-22 ENCOUNTER — Emergency Department (HOSPITAL_COMMUNITY): Payer: BC Managed Care – PPO

## 2014-09-22 ENCOUNTER — Emergency Department (HOSPITAL_COMMUNITY)
Admission: EM | Admit: 2014-09-22 | Discharge: 2014-09-22 | Disposition: A | Discharge status: Home / Self Care | Payer: BC Managed Care – PPO | Attending: Emergency Medicine | Admitting: Emergency Medicine

## 2014-09-22 DIAGNOSIS — Z720 Tobacco use: Secondary | ICD-10-CM | POA: Insufficient documentation

## 2014-09-22 DIAGNOSIS — Z880 Allergy status to penicillin: Secondary | ICD-10-CM | POA: Insufficient documentation

## 2014-09-22 DIAGNOSIS — Z862 Personal history of diseases of the blood and blood-forming organs and certain disorders involving the immune mechanism: Secondary | ICD-10-CM | POA: Insufficient documentation

## 2014-09-22 DIAGNOSIS — Z7952 Long term (current) use of systemic steroids: Secondary | ICD-10-CM | POA: Diagnosis not present

## 2014-09-22 DIAGNOSIS — M79673 Pain in unspecified foot: Secondary | ICD-10-CM

## 2014-09-22 DIAGNOSIS — M79671 Pain in right foot: Secondary | ICD-10-CM | POA: Diagnosis present

## 2014-09-22 DIAGNOSIS — I10 Essential (primary) hypertension: Secondary | ICD-10-CM | POA: Diagnosis not present

## 2014-09-22 MED ORDER — IBUPROFEN 600 MG PO TABS: 600.0000 mg | ORAL_TABLET | Freq: Four times a day (QID) | ORAL | Status: DC | PRN

## 2014-09-22 NOTE — ED Provider Notes (Signed)
CSN: [id number]     Arrival date & time 09/22/14  1254 History  This chart was scribed for non-physician practitioner working with Robin LennertJoseph L Zammit, MD by Murriel HopperAlec Bankhead, ED Scribe. This patient was seen in room APFT22/APFT22 and the patient's care was started at 3:01 PM.  Chief Complaint  Patient presents with  . Foot Pain     The history is provided by the patient. No language interpreter was used.     HPI Comments: Robin Bender is a 38 y.o. female who presents to the Emergency Department complaining of constant, worsening right plantar foot pain that has been present for 2-3 months. Pt states that she feels like there is a knot in her foot, and every time she walks it feels like she is stepping on an acorn or another foreign object. Pt states that she does not have a job but walks around a lot between her house and her parent's house across the street. Pt has taken ibuprofen and a prescription muscle relaxer with no relief. Pt denies walking around barefoot anywhere, or having similar problems in the past.    Past Medical History  Diagnosis Date  . Hypertension   . Anemia    Past Surgical History  Procedure Laterality Date  . Cesarean section    . Abdominal hysterectomy    . Carpal tunnel release    . Ovarian cyst removed    . Laparoscopic appendectomy N/A 05/19/2014    Procedure: APPENDECTOMY LAPAROSCOPIC;  Surgeon: Dalia HeadingMark A Jenkins, MD;  Location: [facility];  Service: General;  Laterality: N/A;  . Appendectomy     Family History  Problem Relation Age of Onset  . Cancer Mother     cervical  . Hypertension Mother   . Heart disease Mother    History  Substance Use Topics  . Smoking status: Current Every Day Smoker -- 0.30 packs/day for 18 years    Types: Cigarettes  . Smokeless tobacco: Not on file  . Alcohol Use: No   OB History    Gravida Para Term Preterm AB TAB SAB Ectopic Multiple Living   2 2 2       2      Review of Systems  Constitutional: Negative for fever.   Musculoskeletal: Positive for myalgias.  Neurological: Negative for weakness and numbness.      Allergies  Ampicillin-sulbactam sodium; Codeine; and Demerol  Home Medications   Prior to Admission medications   Medication Sig Start Date End Date Taking? Authorizing Provider  cyclobenzaprine (FLEXERIL) 10 MG tablet Take 10 mg by mouth daily as needed for muscle spasms.  07/04/14 07/04/15 Yes Historical Provider, MD  nystatin-triamcinolone ointment (MYCOLOG) Apply 1 application topically 2 (two) times daily. 08/20/14  Yes Junie Spencerhristy A Hawks, FNP  traMADol (ULTRAM) 50 MG tablet Take 50 mg by mouth daily as needed for moderate pain.  07/04/14  Yes Historical Provider, MD  fluconazole (DIFLUCAN) 150 MG tablet Take 1 tablet (150 mg total) by mouth once. Patient not taking: Reported on 09/22/2014 08/20/14   Junie Spencerhristy A Hawks, FNP  ibuprofen (ADVIL,MOTRIN) 600 MG tablet Take 1 tablet (600 mg total) by mouth every 6 (six) hours as needed. 09/22/14   Burgess AmorJulie Aleta Manternach, PA-C   BP 122/69 mmHg  Pulse 113  Temp(Src) 98.2 F (36.8 C) (Oral)  Resp 18  Ht 5\' 3"  (1.6 m)  Wt 270 lb (122.471 kg)  BMI 47.84 kg/m2  SpO2 97% Physical Exam  Constitutional: She appears well-developed and well-nourished.  HENT:  Head: Atraumatic.  Neck: Normal range of motion.  Cardiovascular:  Pulses equal bilaterally  Musculoskeletal: She exhibits tenderness.  Neurological: She is alert. She has normal strength. She displays normal reflexes. No sensory deficit.  Skin: Skin is warm and dry.  Right foot:  Indurated nodule within the subcutaneous layer of skin plantar surface over 2nd metatarsal head. No puncture wounds, skin intact, normal coloration.  Psychiatric: She has a normal mood and affect.    ED Course  Procedures (including critical care time)  DIAGNOSTIC STUDIES: Oxygen Saturation is 97% on RA, normal by my interpretation.    COORDINATION OF CARE: 3:06 PM Discussed treatment plan with pt at bedside and pt  agreed to plan.   Labs Review Labs Reviewed - No data to display  Imaging Review Dg Foot Complete Right  09/22/2014   CLINICAL DATA:  Right foot pain for months, hard painful spot at center of the ball of the foot  EXAM: RIGHT FOOT COMPLETE - 3+ VIEW  COMPARISON:  03/27/11  FINDINGS: Small Achilles spur. No acute fracture or dislocation. No soft tissue or osseous lesion to correspond to the clinical history. No radiopaque foreign object.  IMPRESSION: No acute osseous abnormality.   Electronically Signed   By: Jeronimo GreavesKyle  Talbot M.D.   On: 09/22/2014 16:35     EKG Interpretation None      MDM   Final diagnoses:  Foot pain    Subcutaneous nodule of unclear etiology. Xray negative for radiopaque fb.  Exam not c/w callous, abscess, sebaceous cyst.  Referral made to podiatry for further evaluation.  She was provided crutches to avoid weight bearing and pain.  I personally performed the services described in this documentation, which was scribed in my presence. The recorded information has been reviewed and is accurate.   Burgess AmorJulie Randle Shatzer, PA-C 09/23/14 1720  Robin LennertJoseph L Zammit, MD 09/24/14 [phone]

## 2014-09-22 NOTE — Discharge Instructions (Signed)
Your xray is negative today for any foreign body or other reason for this painful subcutaneous nodule.   Use the crutches to minimize weight bearing.  Call the podiatrist listed for further evaluation of this problem.

## 2014-09-22 NOTE — ED Notes (Signed)
Right foot pain 2-3 months.  States she feels like she is walking on an acorn.  Denies injury.

## 2015-01-09 ENCOUNTER — Ambulatory Visit: Payer: Self-pay | Admitting: Obstetrics and Gynecology

## 2015-01-16 ENCOUNTER — Encounter: Payer: Self-pay | Admitting: Obstetrics and Gynecology

## 2015-01-16 ENCOUNTER — Ambulatory Visit: Payer: Self-pay | Admitting: Obstetrics and Gynecology

## 2015-04-04 DIAGNOSIS — G4733 Obstructive sleep apnea (adult) (pediatric): Secondary | ICD-10-CM | POA: Insufficient documentation

## 2015-09-14 ENCOUNTER — Emergency Department (HOSPITAL_COMMUNITY)
Admission: EM | Admit: 2015-09-14 | Discharge: 2015-09-14 | Disposition: A | Discharge status: Home / Self Care | Payer: 59 | Attending: Emergency Medicine | Admitting: Emergency Medicine

## 2015-09-14 ENCOUNTER — Encounter (HOSPITAL_COMMUNITY): Payer: Self-pay | Admitting: Emergency Medicine

## 2015-09-14 DIAGNOSIS — Z720 Tobacco use: Secondary | ICD-10-CM | POA: Insufficient documentation

## 2015-09-14 DIAGNOSIS — B070 Plantar wart: Secondary | ICD-10-CM

## 2015-09-14 DIAGNOSIS — L988 Other specified disorders of the skin and subcutaneous tissue: Secondary | ICD-10-CM | POA: Diagnosis present

## 2015-09-14 DIAGNOSIS — M25551 Pain in right hip: Secondary | ICD-10-CM | POA: Diagnosis not present

## 2015-09-14 DIAGNOSIS — I10 Essential (primary) hypertension: Secondary | ICD-10-CM | POA: Diagnosis not present

## 2015-09-14 DIAGNOSIS — Z862 Personal history of diseases of the blood and blood-forming organs and certain disorders involving the immune mechanism: Secondary | ICD-10-CM | POA: Diagnosis not present

## 2015-09-14 MED ORDER — ONDANSETRON HCL 4 MG PO TABS
4.0000 mg | ORAL_TABLET | Freq: Once | ORAL | Status: AC
  Administered 2015-09-14: 4 mg via ORAL
  Filled 2015-09-14: qty 1

## 2015-09-14 MED ORDER — CLINDAMYCIN HCL 150 MG PO CAPS: 150.0000 mg | ORAL_CAPSULE | Freq: Four times a day (QID) | ORAL | Status: DC

## 2015-09-14 MED ORDER — HYDROCODONE-ACETAMINOPHEN 5-325 MG PO TABS: 1.0000 | ORAL_TABLET | ORAL | Status: DC | PRN

## 2015-09-14 MED ORDER — KETOROLAC TROMETHAMINE 10 MG PO TABS
10.0000 mg | ORAL_TABLET | Freq: Once | ORAL | Status: AC
  Administered 2015-09-14: 10 mg via ORAL
  Filled 2015-09-14: qty 1

## 2015-09-14 MED ORDER — DICLOFENAC SODIUM 75 MG PO TBEC: 75.0000 mg | DELAYED_RELEASE_TABLET | Freq: Two times a day (BID) | ORAL | Status: DC

## 2015-09-14 MED ORDER — DOXYCYCLINE HYCLATE 100 MG PO TABS
100.0000 mg | ORAL_TABLET | Freq: Once | ORAL | Status: AC
  Administered 2015-09-14: 100 mg via ORAL
  Filled 2015-09-14: qty 1

## 2015-09-14 NOTE — ED Notes (Signed)
Pt c/o blisters to right heel.

## 2015-09-14 NOTE — Discharge Instructions (Signed)
Your examination is consistent with plantar warts. There is question of a secondary infection present. Please soak your foot in warm Epsom salt water for 15-20 minutes daily. Please apply Vaseline to soften the callus area at bedtime. Please use diclofenac 2 times daily with food, and clindamycin 4 times daily. Use Norco for pain if needed. Norco may cause drowsiness, please use this medication with caution. Please see Dr. R Plantar Warts Plantar Elvera Lennoxwarts are small growths on the bottom of the foot (sole). Warts are caused by a type of germ (virus). Most warts are not painful, and they usually do not cause problems. Sometimes, plantar warts can cause pain when you walk. Warts often go away on their own in time. Treatments may be done if needed. HOME CARE General Instructions  Apply creams or solutions only as told by your doctor. Follow these steps if your doctor tells you to do so:  Soak your foot in warm water.  Remove the top layer of softened skin before you apply the medicine. You can use a pumice stone to remove the tissue.  After you apply the medicine, put a bandage over the area of the wart.  Repeat the process every day or as told by your doctor.  Do not scratch or pick at a wart.  Wash your hands after you touch a wart.  If a wart is painful, try putting a bandage with a hole in the middle over the wart.  Keep all follow-up visits as told by your doctor. This is important. Prevention  Wear shoes and socks. Change socks every day.  Keep your feet clean and dry.  Check your feet often.  Avoid direct contact with warts on other people. GET HELP IF:  Your warts do not improve after treatment.  You have redness, swelling, or pain at the site of a wart.  You have bleeding from a wart, and the bleeding does not stop when you put light pressure on the wart.  You have diabetes and you get a wart.   This information is not intended to replace advice given to you by your health  care provider. Make sure you discuss any questions you have with your health care provider.   Document Released: 11/27/2010 Document Revised: 07/16/2015 Document Reviewed: 01/20/2015 Elsevier Interactive Patient Education 2016 [employer] egal, or Dr. Pricilla Holmucker or member of their teams for management of these warts.

## 2015-09-14 NOTE — ED Provider Notes (Signed)
History  By signing my name below, I, Karle Plumber, attest that this documentation has been prepared under the direction and in the presence of Ivery Quale, PA-C. Electronically Signed: Karle Plumber, ED Scribe. 09/14/2015. 12:10 PM.  Chief Complaint  Patient presents with  . Blister   The history is provided by the patient and medical records. No language interpreter was used.    HPI Comments:  Robin Bender is a 39 y.o. morbidly obese female who presents to the Emergency Department complaining of a blister to the heel of the right foot that began a few days ago. Pt reports constant, moderate pain and states it feels as if she is "walking on a Lego". She reports associated right hip pain secondary to walking carefully to avoid pressure on the area. She states she has a similar lesion to the same heel that appeared about one year ago and was diagnosed with a plantar's wart. She has been taking Ibuprofen with moderate, temporary relief of the pain. Bearing weight, walking or applying pressure increases the pain. She denies alleviating factors. She denies fever or chills. She denies any known trauma or injury. Pt reports allergy to Ampicillin and states she has had a hysterectomy, denying pregnancy.  Past Medical History  Diagnosis Date  . Hypertension   . Anemia    Past Surgical History  Procedure Laterality Date  . Cesarean section    . Abdominal hysterectomy    . Carpal tunnel release    . Ovarian cyst removed    . Laparoscopic appendectomy N/A 05/19/2014    Procedure: APPENDECTOMY LAPAROSCOPIC;  Surgeon: Dalia Heading, MD;  Location: [facility];  Service: General;  Laterality: N/A;  . Appendectomy     Family History  Problem Relation Age of Onset  . Cancer Mother     cervical  . Hypertension Mother   . Heart disease Mother    Social History  Substance Use Topics  . Smoking status: Current Every Day Smoker -- 0.30 packs/day for 18 years    Types: Cigarettes  .  Smokeless tobacco: None  . Alcohol Use: No   OB History    Gravida Para Term Preterm AB TAB SAB Ectopic Multiple Living   Review of Systems  Constitutional: Negative for fever and chills.  Skin: Positive for wound.  All other systems reviewed and are negative.   Allergies  Ampicillin-sulbactam sodium; Codeine; and Demerol  Home Medications   Prior to Admission medications   Medication Sig Start Date End Date Taking? Authorizing Provider  ibuprofen (ADVIL,MOTRIN) 200 MG tablet Take 800 mg by mouth every 6 (six) hours as needed for moderate pain.   Yes Historical Provider, MD   Triage Vitals: BP 129/83 mmHg  Pulse 114  Temp(Src) 98.2 F (36.8 C)  Resp 18  Ht  (1.6 m)  Wt 267 lb (121.11 kg)  BMI 47.31 kg/m2  SpO2 96% Physical Exam  Constitutional: She is oriented to person, place, and time. She appears well-developed and well-nourished.  HENT:  Head: Normocephalic and atraumatic.  Eyes: EOM are normal.  Neck: Normal range of motion.  Cardiovascular: Normal rate.   DP and PT pulses 2+ Cap refill of RLE is less than 2 seconds  Pulmonary/Chest: Effort normal.  Musculoskeletal: Normal range of motion.  Cyst at the right achilles tendon area. Right achilles tendon intact. Increased tenderness to the right heel. Callous of right heel noted. No  red streaking appreciated. Shallow ulcer in callous of right heel that is tender to palpation. Tenderness in mid right foot but area is not hot. Callous over the 2nd, 3rd and 5th metatarsal heads.  Neurological: She is alert and oriented to person, place, and time.  Skin: Skin is warm and dry.  Psychiatric: She has a normal mood and affect. Her behavior is normal.  Nursing note and vitals reviewed.   ED Course  Procedures (including critical care time) DIAGNOSTIC STUDIES: Oxygen Saturation is 96% on RA, normal by my interpretation.   COORDINATION OF CARE: 12:04 PM- Will prescribe antibiotic. Advised pt to do  warm soaks and apply Vaseline to the area then cover. Return precautions discussed and advised pt to follow up with podiatry as soon as possible. Pt verbalizes understanding and agrees to plan.  Medications - No data to display   MDM  Exam favors plantar warts of the right heel. Question infection under one of them. diagnosis discussed with the patient terms which he understands. I have strongly advised the patient to see a podiatry specialist or orthopedic specialist as sone as possible. Prescription for clindamycin, diclofenac, and Norco given to the patient. Patient acknowledges understanding of the instructions. As well as the importance of being seen by orthopedic or podiatry specialty.    Final diagnoses:  None    *I have reviewed nursing notes, vital signs, and all appropriate lab and imaging results for this patient.**  I personally performed the services described in this documentation, which was scribed in my presence. The recorded information has been reviewed and is accurate.    Ivery QualeHobson Zacary Bauer, PA-C 09/14/15 1522  Vanetta MuldersScott Zackowski, MD 09/15/15 1622

## 2016-05-19 ENCOUNTER — Encounter (HOSPITAL_COMMUNITY): Payer: Self-pay | Admitting: *Deleted

## 2016-05-19 ENCOUNTER — Emergency Department (HOSPITAL_COMMUNITY): Payer: Self-pay

## 2016-05-19 ENCOUNTER — Emergency Department (HOSPITAL_COMMUNITY)
Admission: EM | Admit: 2016-05-19 | Discharge: 2016-05-20 | Disposition: A | Discharge status: Home / Self Care | Payer: Self-pay | Attending: Emergency Medicine | Admitting: Emergency Medicine

## 2016-05-19 DIAGNOSIS — Y999 Unspecified external cause status: Secondary | ICD-10-CM | POA: Insufficient documentation

## 2016-05-19 DIAGNOSIS — W108XXA Fall (on) (from) other stairs and steps, initial encounter: Secondary | ICD-10-CM | POA: Insufficient documentation

## 2016-05-19 DIAGNOSIS — I10 Essential (primary) hypertension: Secondary | ICD-10-CM | POA: Insufficient documentation

## 2016-05-19 DIAGNOSIS — S93401A Sprain of unspecified ligament of right ankle, initial encounter: Secondary | ICD-10-CM

## 2016-05-19 DIAGNOSIS — Y929 Unspecified place or not applicable: Secondary | ICD-10-CM | POA: Insufficient documentation

## 2016-05-19 DIAGNOSIS — F17210 Nicotine dependence, cigarettes, uncomplicated: Secondary | ICD-10-CM | POA: Insufficient documentation

## 2016-05-19 DIAGNOSIS — Y9301 Activity, walking, marching and hiking: Secondary | ICD-10-CM | POA: Insufficient documentation

## 2016-05-19 HISTORY — DX: Fibromyalgia: M79.7

## 2016-05-19 NOTE — ED Notes (Signed)
Pt c/o right ankle pain after falling off her steps; pt has bruising and swelling to side of right ankle

## 2016-05-20 MED ORDER — IBUPROFEN 600 MG PO TABS: 600.0000 mg | ORAL_TABLET | Freq: Four times a day (QID) | ORAL | Status: DC | PRN

## 2016-05-20 MED ORDER — PROMETHAZINE HCL 12.5 MG PO TABS
12.5000 mg | ORAL_TABLET | Freq: Once | ORAL | Status: AC
  Administered 2016-05-20: 12.5 mg via ORAL
  Filled 2016-05-20: qty 1

## 2016-05-20 MED ORDER — TRAMADOL HCL 50 MG PO TABS: 50.0000 mg | ORAL_TABLET | Freq: Four times a day (QID) | ORAL | Status: DC | PRN

## 2016-05-20 MED ORDER — TRAMADOL HCL 50 MG PO TABS
100.0000 mg | ORAL_TABLET | Freq: Once | ORAL | Status: AC
  Administered 2016-05-20: 100 mg via ORAL
  Filled 2016-05-20: qty 2

## 2016-05-20 MED ORDER — IBUPROFEN 800 MG PO TABS
800.0000 mg | ORAL_TABLET | Freq: Once | ORAL | Status: AC
  Administered 2016-05-20: 800 mg via ORAL
  Filled 2016-05-20: qty 1

## 2016-05-20 NOTE — ED Provider Notes (Signed)
CSN: [id number]     Arrival date & time 05/19/16  2249 History   First MD Initiated Contact with Patient 05/19/16 2356     Chief Complaint  Patient presents with  . Ankle Pain     (Consider location/radiation/quality/duration/timing/severity/associated sxs/prior Treatment) HPI Comments: Patient is a 40 year old female who presents to the emergency department with a complaint of right ankle pain.  The patient states that she was walking across some stepping stones. She stumbled, fell, and injured the right ankle and bruised the right lower extremity. The patient states that she can barely apply weight because of pain. She denies any loss of consciousness. She denies any previous operations or procedures involving the right lower extremity.  Patient is a 40 y.o. female presenting with ankle pain. The history is provided by the patient.  Ankle Pain   Past Medical History  Diagnosis Date  . Hypertension   . Anemia   . Fibromyalgia    Past Surgical History  Procedure Laterality Date  . Cesarean section    . Abdominal hysterectomy    . Carpal tunnel release    . Ovarian cyst removed    . Laparoscopic appendectomy N/A 05/19/2014    Procedure: APPENDECTOMY LAPAROSCOPIC;  Surgeon: Dalia Heading, MD;  Location: [facility];  Service: General;  Laterality: N/A;  . Appendectomy     Family History  Problem Relation Age of Onset  . Cancer Mother     cervical  . Hypertension Mother   . Heart disease Mother    Social History  Substance Use Topics  . Smoking status: Current Every Day Smoker -- 0.25 packs/day for 18 years    Types: Cigarettes  . Smokeless tobacco: None  . Alcohol Use: No   OB History    Gravida Para Term Preterm AB TAB SAB Ectopic Multiple Living   Review of Systems  Musculoskeletal: Positive for arthralgias.  All other systems reviewed and are negative.     Allergies  Ampicillin-sulbactam sodium; Codeine; and Demerol  Home Medications    Prior to Admission medications   Medication Sig Start Date End Date Taking? Authorizing Provider  clindamycin (CLEOCIN) 150 MG capsule Take 1 capsule (150 mg total) by mouth 4 (four) times daily. 09/14/15   Ivery Quale, PA-C  ibuprofen (ADVIL,MOTRIN) 200 MG tablet Take 800 mg by mouth every 6 (six) hours as needed for moderate pain.    Historical Provider, MD   BP 139/97 mmHg  Pulse 91  Temp(Src) 98 F (36.7 C) (Oral)  Resp 18  Ht  (1.6 m)  Wt 117.935 kg  BMI 46.07 kg/m2  SpO2 96% Physical Exam  Constitutional: She is oriented to person, place, and time. She appears well-developed and well-nourished.  Non-toxic appearance.  HENT:  Head: Normocephalic.  Right Ear: Tympanic membrane and external ear normal.  Left Ear: Tympanic membrane and external ear normal.  Eyes: EOM and lids are normal. Pupils are equal, round, and reactive to light.  Neck: Normal range of motion. Neck supple. Carotid bruit is not present.  Cardiovascular: Normal rate, regular rhythm, normal heart sounds, intact distal pulses and normal pulses.   Pulmonary/Chest: Breath sounds normal. No respiratory distress.  Abdominal: Soft. Bowel sounds are normal. There is no tenderness. There is no guarding.  Musculoskeletal: Normal range of motion.  There is soreness of the right thigh. There's no deformity of the right hip or knee. There is full  range of motion of the right knee. There is soreness of the lateral calf. There is bruising of the lateral malleolus of the ankle. The dorsalis pedis pulse is 2+, the capillary refill is less than 2 seconds. There is no deformity noted of the toes of the right lower extremity. There is good range of motion of the left lower extremity in its entirety.  Lymphadenopathy:       Head (right side): No submandibular adenopathy present.       Head (left side): No submandibular adenopathy present.    She has no cervical adenopathy.  Neurological: She is alert and oriented to person,  place, and time. She has normal strength. No cranial nerve deficit or sensory deficit.  Skin: Skin is warm and dry.  Psychiatric: She has a normal mood and affect. Her speech is normal.  Nursing note and vitals reviewed.   ED Course  Procedures (including critical care time) Labs Review Labs Reviewed - No data to display  Imaging Review Dg Ankle Complete Right  05/19/2016  CLINICAL DATA:  40 year old female with trauma the right ankle. EXAM: RIGHT ANKLE - COMPLETE 3+ VIEW COMPARISON:  Multiple right foot and ankle radiographs dating back to 03/27/2011 FINDINGS: There is no acute fracture or dislocation. The bones are well mineralized. The ankle mortise is intact. The soft tissues appear unremarkable. No radiopaque foreign object. IMPRESSION: Negative. Electronically Signed   By: Elgie CollardArash  Radparvar M.D.   On: 05/19/2016 23:19   I have personally reviewed and evaluated these images and lab results as part of my medical decision-making.   EKG Interpretation None      MDM  Vital signs within normal limits. X-ray of the left ankle is negative for acute fracture or dislocation.  The patient will be treated with ibuprofen and Ultram. Ice pack will be provided as well as crutches. The patient will follow-up with Dr. August Saucerean if any orthopedic changes or problems.    Final diagnoses:  None    **I have reviewed nursing notes, vital signs, and all appropriate lab and imaging results for this patient.Ivery Quale*    Eleonore Shippee, PA-C 05/20/16 0022  Shon Batonourtney F Horton, MD 05/20/16 [phone]

## 2016-05-20 NOTE — Discharge Instructions (Signed)
Ankle Sprain  An ankle sprain is an injury to the strong, fibrous tissues (ligaments) that hold the bones of your ankle joint together.   CAUSES  An ankle sprain is usually caused by a fall or by twisting your ankle. Ankle sprains most commonly occur when you step on the outer edge of your foot, and your ankle turns inward. People who participate in sports are more prone to these types of injuries.   SYMPTOMS    Pain in your ankle. The pain may be present at rest or only when you are trying to stand or walk.   Swelling.   Bruising. Bruising may develop immediately or within 1 to 2 days after your injury.   Difficulty standing or walking, particularly when turning corners or changing directions.  DIAGNOSIS   Your caregiver will ask you details about your injury and perform a physical exam of your ankle to determine if you have an ankle sprain. During the physical exam, your caregiver will press on and apply pressure to specific areas of your foot and ankle. Your caregiver will try to move your ankle in certain ways. An X-ray exam may be done to be sure a bone was not broken or a ligament did not separate from one of the bones in your ankle (avulsion fracture).   TREATMENT   Certain types of braces can help stabilize your ankle. Your caregiver can make a recommendation for this. Your caregiver may recommend the use of medicine for pain. If your sprain is severe, your caregiver may refer you to a surgeon who helps to restore function to parts of your skeletal system (orthopedist) or a physical therapist.  HOME CARE INSTRUCTIONS    Apply ice to your injury for 1-2 days or as directed by your caregiver. Applying ice helps to reduce inflammation and pain.    Put ice in a plastic bag.    Place a towel between your skin and the bag.    Leave the ice on for 15-20 minutes at a time, every 2 hours while you are awake.   Only take over-the-counter or prescription medicines for pain, discomfort, or fever as directed by  your caregiver.   Elevate your injured ankle above the level of your heart as much as possible for 2-3 days.   If your caregiver recommends crutches, use them as instructed. Gradually put weight on the affected ankle. Continue to use crutches or a cane until you can walk without feeling pain in your ankle.   If you have a plaster splint, wear the splint as directed by your caregiver. Do not rest it on anything harder than a pillow for the first 24 hours. Do not put weight on it. Do not get it wet. You may take it off to take a shower or bath.   You may have been given an elastic bandage to wear around your ankle to provide support. If the elastic bandage is too tight (you have numbness or tingling in your foot or your foot becomes cold and blue), adjust the bandage to make it comfortable.   If you have an air splint, you may blow more air into it or let air out to make it more comfortable. You may take your splint off at night and before taking a shower or bath. Wiggle your toes in the splint several times per day to decrease swelling.  SEEK MEDICAL CARE IF:    You have rapidly increasing bruising or swelling.   Your toes feel   extremely cold or you lose feeling in your foot.   Your pain is not relieved with medicine.  SEEK IMMEDIATE MEDICAL CARE IF:   Your toes are numb or blue.   You have severe pain that is increasing.  MAKE SURE YOU:    Understand these instructions.   Will watch your condition.   Will get help right away if you are not doing well or get worse.     This information is not intended to replace advice given to you by your health care provider. Make sure you discuss any questions you have with your health care provider.     Document Released: 10/25/2005 Document Revised: 11/15/2014 Document Reviewed: 11/06/2011  Elsevier Interactive Patient Education 2016 [employer]

## 2016-10-02 ENCOUNTER — Encounter (HOSPITAL_COMMUNITY): Payer: Self-pay | Admitting: Emergency Medicine

## 2016-10-02 ENCOUNTER — Emergency Department (HOSPITAL_COMMUNITY)
Admission: EM | Admit: 2016-10-02 | Discharge: 2016-10-02 | Disposition: A | Discharge status: Home / Self Care | Payer: Medicaid Other | Attending: Emergency Medicine | Admitting: Emergency Medicine

## 2016-10-02 DIAGNOSIS — I10 Essential (primary) hypertension: Secondary | ICD-10-CM | POA: Diagnosis not present

## 2016-10-02 DIAGNOSIS — Z791 Long term (current) use of non-steroidal anti-inflammatories (NSAID): Secondary | ICD-10-CM | POA: Insufficient documentation

## 2016-10-02 DIAGNOSIS — F17210 Nicotine dependence, cigarettes, uncomplicated: Secondary | ICD-10-CM | POA: Insufficient documentation

## 2016-10-02 DIAGNOSIS — R21 Rash and other nonspecific skin eruption: Secondary | ICD-10-CM | POA: Diagnosis present

## 2016-10-02 MED ORDER — ACYCLOVIR 800 MG PO TABS: 800.0000 mg | ORAL_TABLET | Freq: Four times a day (QID) | ORAL | 0 refills | Status: DC

## 2016-10-02 NOTE — ED Provider Notes (Signed)
[facility] Provider Note   CSN: [id number] Arrival date & time: 10/02/16  1942     History   Chief Complaint Chief Complaint  Patient presents with  . Rash    HPI Robin Bender is a 40 y.o. female.  The history is provided by the patient. No language interpreter was used.  Rash   This is a new problem. The problem has not changed since onset.The problem is associated with nothing. There has been no fever. The rash is present on the face. The pain is mild. She has tried nothing for the symptoms.  Pt has a red area left cheek and mid forehead.  Pt is concerned that this could be shingles.  Pt reports she has had before and it felt the same way  Past Medical History:  Diagnosis Date  . Anemia   . Fibromyalgia   . Hypertension     Patient Active Problem List   Diagnosis Date Noted  . Arthritis of both knees 06/02/2014  . Breast pain 10/17/2012  . 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 ABDOMINAL HYSTERECTOMY    . APPENDECTOMY    . CARPAL TUNNEL RELEASE    . CESAREAN SECTION    . LAPAROSCOPIC APPENDECTOMY N/A 05/19/2014   Procedure: APPENDECTOMY LAPAROSCOPIC;  Surgeon: Dalia HeadingMark A Jenkins, MD;  Location: [facility];  Service: General;  Laterality: N/A;  . ovarian cyst removed      OB History    Gravida Para Term Preterm AB Living   2 2 2     2    SAB TAB Ectopic Multiple Live Births                   Home Medications    Prior to Admission medications   Medication Sig Start Date End Date Taking? Authorizing Provider  acyclovir (ZOVIRAX) 800 MG tablet Take 1 tablet (800 mg total) by mouth 4 (four) times daily. 10/02/16   Elson AreasLeslie K Kamoni Depree, PA-C  clindamycin (CLEOCIN) 150 MG capsule Take 1 capsule (150 mg total) by mouth 4 (four) times daily. 09/14/15   Ivery QualeHobson Bryant, PA-C  ibuprofen (ADVIL,MOTRIN) 600 MG tablet Take 1 tablet (600 mg total) by mouth every 6 (six) hours as needed. 05/20/16   Ivery QualeHobson Bryant, PA-C    traMADol (ULTRAM) 50 MG tablet Take 1 tablet (50 mg total) by mouth every 6 (six) hours as needed. 05/20/16   Ivery QualeHobson Bryant, PA-C    Family History Family History  Problem Relation Age of Onset  . Cancer Mother     cervical  . Hypertension Mother   . Heart disease Mother     Social History Social History  Substance Use Topics  . Smoking status: Current Every Day Smoker    Packs/day: 0.25    Years: 18.00    Types: Cigarettes  . Smokeless tobacco: Never Used  . Alcohol use No     Allergies   Ampicillin-sulbactam sodium; Codeine; and Demerol   Review of Systems Review of Systems  Skin: Positive for rash.  All other systems reviewed and are negative.    Physical Exam Updated Vital Signs Ht 5\' 3"  (1.6 m)   Wt 118.8 kg   BMI 46.41 kg/m   Physical Exam  Constitutional: She is oriented to person, place, and time. She appears well-developed and well-nourished.  HENT:  Head: Normocephalic.  Mouth/Throat: Oropharynx is clear and moist.  Red raised area forehead and cheek,    Eyes:  EOM are normal.  Neck: Normal range of motion.  Pulmonary/Chest: Effort normal.  Abdominal: She exhibits no distension.  Musculoskeletal: Normal range of motion.  Neurological: She is alert and oriented to person, place, and time.  Psychiatric: She has a normal mood and affect.  Nursing note and vitals reviewed.    ED Treatments / Results  Labs (all labs ordered are listed, but only abnormal results are displayed) Labs Reviewed - No data to display  EKG  EKG Interpretation None       Radiology No results found.  Procedures Procedures (including critical care time)  Medications Ordered in ED Medications - No data to display   Initial Impression / Assessment and Plan / ED Course  I have reviewed the triage vital signs and the nursing notes.  Pertinent labs & imaging results that were available during my care of the patient were reviewed by me and considered in my  medical decision making (see chart for details).  Clinical Course     I counseled pt,  This currently does not look like shingles.  I will start acyclovir as she has the same feeling as with shingles.  I advied 2 day recheck with primary MD  Final Clinical Impressions(s) / ED Diagnoses   Final diagnoses:  Rash    New Prescriptions Discharge Medication List as of 10/02/2016  7:57 PM    START taking these medications   Details  acyclovir (ZOVIRAX) 800 MG tablet Take 1 tablet (800 mg total) by mouth 4 (four) times daily., Starting Sat 10/02/2016, Print      An After Visit Summary was printed and given to the patient.   Lonia SkinnerLeslie K ChickashaSofia, PA-C 10/02/16 2012    Eber HongBrian Miller, MD 10/03/16 [phone]

## 2016-10-02 NOTE — Discharge Instructions (Signed)
You may have shingles.  Start acyclovir.   Recheck in 2 days

## 2016-10-02 NOTE — ED Notes (Signed)
Pt with hx of shingles, pt believes she is having outbreak of shingles again on her face. Denies pain at present.

## 2016-10-02 NOTE — ED Triage Notes (Signed)
Pt states she began having a facial rash yesterday and then began having a fever. Has a hx of shingles and states this is how they began last night. Denies fever, other rash, or d/c from area.

## 2016-10-07 ENCOUNTER — Telehealth: Payer: Self-pay | Admitting: Obstetrics and Gynecology

## 2016-10-07 ENCOUNTER — Ambulatory Visit (INDEPENDENT_AMBULATORY_CARE_PROVIDER_SITE_OTHER): Payer: Medicaid Other | Admitting: Obstetrics and Gynecology

## 2016-10-07 ENCOUNTER — Encounter: Payer: Self-pay | Admitting: Obstetrics and Gynecology

## 2016-10-07 DIAGNOSIS — Z0000 Encounter for general adult medical examination without abnormal findings: Secondary | ICD-10-CM

## 2016-10-07 DIAGNOSIS — A630 Anogenital (venereal) warts: Secondary | ICD-10-CM

## 2016-10-07 DIAGNOSIS — Z01411 Encounter for gynecological examination (general) (routine) with abnormal findings: Secondary | ICD-10-CM

## 2016-10-07 MED ORDER — ESTRADIOL 1 MG PO TABS: 1.0000 mg | ORAL_TABLET | Freq: Every day | ORAL | 11 refills | Status: DC

## 2016-10-07 NOTE — Progress Notes (Addendum)
  Assessment:  Annual Gyn Exam Vulvar condyloma and skin tags  obesity, needs addresssing Plan:  1. pap smear not needed, s/p total hysterectomy  2. Return in the a couple of weeks to have excision of vulvar condyloma and skin tags. After that return annually or prn 3    Annual mammogram advised Subjective:  Robin Bender is a 40 y.o. female G2P2002 who presents for annual exam. No LMP recorded. Patient has had a hysterectomy.with removal of cervix The patient has no complaints today aside from a few skin tags(condyloma) around the vulva.   The following portions of the patient's history were reviewed and updated as appropriate: allergies, current medications, past family history, past medical history, past social history, past surgical history and problem list.  Exercise level 6000 steps/day Past Medical History:  Diagnosis Date  . Anemia   . Fibromyalgia   . Hypertension     Past Surgical History:  Procedure Laterality Date  . ABDOMINAL HYSTERECTOMY    . APPENDECTOMY    . CARPAL TUNNEL RELEASE    . CESAREAN SECTION    . LAPAROSCOPIC APPENDECTOMY N/A 05/19/2014   Procedure: APPENDECTOMY LAPAROSCOPIC;  Surgeon: Dalia HeadingMark A Jenkins, MD;  Location: [facility];  Service: General;  Laterality: N/A;  . ovarian cyst removed       Current Outpatient Prescriptions:  .  ibuprofen (ADVIL,MOTRIN) 600 MG tablet, Take 1 tablet (600 mg total) by mouth every 6 (six) hours as needed., Disp: 30 tablet, Rfl: 0 .  traMADol (ULTRAM) 50 MG tablet, Take 1 tablet (50 mg total) by mouth every 6 (six) hours as needed., Disp: 15 tablet, Rfl: 0  Review of Systems Constitutional: negative arthritis, ?fibromyalgia Gastrointestinal: negative Genitourinary: negative  Objective:  BP 120/80   Pulse (!) 102   Ht 5' 3.5" (1.613 m)   Wt 261 lb 6.4 oz (118.6 kg)   BMI 45.58 kg/m    BMI: Body mass index is 45.58 kg/m.  General Appearance: Alert, appropriate appearance for age. No acute distress HEENT: Grossly  normal Neck / Thyroid:  Cardiovascular: RRR; normal S1, S2, no murmur Lungs: CTA bilaterally Back: No CVAT Breast Exam: No dimpling, nipple retraction or discharge. No masses or nodes., Normal to inspection, Normal breast tissue bilaterally and No masses or nodes.No dimpling, nipple retraction or discharge. Gastrointestinal: Soft, non-tender, no masses or organomegaly Pelvic Exam: External genitalia: normal general appearance Vaginal: normal mucosa without prolapse or lesions, scar tissue on the left side of the vaginal apex. 1 cm flat condyloma on the right labia minora and a smaller one at 7 o'clock. Peri-clitoral skin tags at 12 o'clock and 11 o'clock Cervix: removed surgically Adnexa: removed surgically Uterus: removed surgically Rectovaginal: not indicated Lymphatic Exam: Non-palpable nodes in neck, clavicular, axillary, or inguinal regions  Skin: no rash or abnormalities Neurologic: Normal gait and speech, no tremor  Psychiatric: Alert and oriented, appropriate affect.  Urinalysis:Not done  Robin Bender. MD Pgr [phone] 9:30 AM   By signing my name below, I, Robin Bender, attest that this documentation has been prepared under the direction and in the presence of Tilda BurrowJohn V Luree Palla, MD. Electronically Signed: Sonum Bender, Neurosurgeoncribe. 10/07/16. 9:30 AM.  I personally performed the services described in this documentation, which was SCRIBED in my presence. The recorded information has been reviewed and considered accurate. It has been edited as necessary during review. Tilda BurrowFERGUSON,Yvan Dority V, MD

## 2016-10-21 ENCOUNTER — Ambulatory Visit: Payer: Medicaid Other | Admitting: Obstetrics and Gynecology

## 2016-10-27 ENCOUNTER — Ambulatory Visit: Payer: Medicaid Other | Admitting: Obstetrics and Gynecology

## 2016-11-03 ENCOUNTER — Ambulatory Visit: Payer: Medicaid Other | Admitting: Obstetrics and Gynecology

## 2016-11-12 ENCOUNTER — Ambulatory Visit (INDEPENDENT_AMBULATORY_CARE_PROVIDER_SITE_OTHER): Payer: Medicaid Other | Admitting: Obstetrics and Gynecology

## 2016-11-12 ENCOUNTER — Encounter: Payer: Self-pay | Admitting: Obstetrics and Gynecology

## 2016-11-12 VITALS — BP 109/74 | HR 86 | Wt 259.4 lb

## 2016-11-12 DIAGNOSIS — A630 Anogenital (venereal) warts: Secondary | ICD-10-CM

## 2016-11-12 DIAGNOSIS — L918 Other hypertrophic disorders of the skin: Secondary | ICD-10-CM

## 2016-11-12 NOTE — Progress Notes (Signed)
Patient ID: [name], female   DOB: 1976-01-08, 41 y.o.   MRN: [id number] SKIN TAG REMOVAL PROCEDURE NOTE The patient's identification was confirmed and consent was obtained. This procedure was performed by Robin Bender at 9:24 AM Site & Appearance: Peri-clitoral condylomas at 12 o'clock and 11 o'clock and 1 cm condyloma on right labia minora Sterile procedures observed: Yes Anesthetic used: Lidocaine 1% without epinephrine AgNO3 applied: Yes Skin tag excised under local anesthesia, site covered with dry, sterile dressing.  Patient tolerated procedure well without complications. Minimal blood loss. Instructions for care discussed verbally and patient provided with additional written instructions for homecare and f/u.   By signing my name below, I, Robin Bender, attest that this documentation has been prepared under the direction and in the presence of Robin BurrowJohn V Aerion Bagdasarian, MD. Electronically Signed: Soijett Bender, ED Scribe. 11/12/16. 9:03 AM.  I personally performed the services described in this documentation, which was SCRIBED in my presence. The recorded information has been reviewed and considered accurate. It has been edited as necessary during review. Robin BurrowFERGUSON,Damani Kelemen V, MD

## 2016-11-18 DIAGNOSIS — A630 Anogenital (venereal) warts: Secondary | ICD-10-CM | POA: Insufficient documentation

## 2016-11-19 DIAGNOSIS — M5136 Other intervertebral disc degeneration, lumbar region: Secondary | ICD-10-CM | POA: Insufficient documentation

## 2016-12-21 ENCOUNTER — Ambulatory Visit (HOSPITAL_COMMUNITY): Payer: Medicaid Other | Admitting: Licensed Clinical Social Worker

## 2017-01-06 ENCOUNTER — Ambulatory Visit (INDEPENDENT_AMBULATORY_CARE_PROVIDER_SITE_OTHER): Payer: Medicaid Other | Admitting: Licensed Clinical Social Worker

## 2017-01-06 DIAGNOSIS — F411 Generalized anxiety disorder: Secondary | ICD-10-CM

## 2017-01-06 DIAGNOSIS — F4310 Post-traumatic stress disorder, unspecified: Secondary | ICD-10-CM

## 2017-01-06 DIAGNOSIS — F331 Major depressive disorder, recurrent, moderate: Secondary | ICD-10-CM

## 2017-01-10 ENCOUNTER — Encounter (HOSPITAL_COMMUNITY): Payer: Self-pay | Admitting: Licensed Clinical Social Worker

## 2017-01-10 DIAGNOSIS — F331 Major depressive disorder, recurrent, moderate: Secondary | ICD-10-CM | POA: Insufficient documentation

## 2017-01-10 DIAGNOSIS — F4310 Post-traumatic stress disorder, unspecified: Secondary | ICD-10-CM | POA: Insufficient documentation

## 2017-01-10 DIAGNOSIS — F411 Generalized anxiety disorder: Secondary | ICD-10-CM | POA: Insufficient documentation
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
## 2017-01-12 ENCOUNTER — Ambulatory Visit (INDEPENDENT_AMBULATORY_CARE_PROVIDER_SITE_OTHER): Payer: Medicaid Other | Admitting: Psychiatry

## 2017-01-12 ENCOUNTER — Encounter (HOSPITAL_COMMUNITY): Payer: Self-pay | Admitting: Psychiatry

## 2017-01-12 VITALS — BP 122/76 | HR 69 | Resp 16 | Ht 63.5 in | Wt 264.0 lb

## 2017-01-12 DIAGNOSIS — F411 Generalized anxiety disorder: Secondary | ICD-10-CM

## 2017-01-12 DIAGNOSIS — Z79899 Other long term (current) drug therapy: Secondary | ICD-10-CM | POA: Diagnosis not present

## 2017-01-12 DIAGNOSIS — Z818 Family history of other mental and behavioral disorders: Secondary | ICD-10-CM | POA: Diagnosis not present

## 2017-01-12 DIAGNOSIS — F4310 Post-traumatic stress disorder, unspecified: Secondary | ICD-10-CM

## 2017-01-12 DIAGNOSIS — F331 Major depressive disorder, recurrent, moderate: Secondary | ICD-10-CM

## 2017-01-12 DIAGNOSIS — F17210 Nicotine dependence, cigarettes, uncomplicated: Secondary | ICD-10-CM

## 2017-01-12 DIAGNOSIS — Z888 Allergy status to other drugs, medicaments and biological substances status: Secondary | ICD-10-CM

## 2017-01-12 MED ORDER — ESCITALOPRAM OXALATE 10 MG PO TABS: 10.0000 mg | ORAL_TABLET | Freq: Every day | ORAL | 0 refills | Status: DC
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
## 2017-01-16 ENCOUNTER — Emergency Department (HOSPITAL_COMMUNITY): Payer: Medicaid Other

## 2017-01-16 ENCOUNTER — Emergency Department (HOSPITAL_COMMUNITY)
Admission: EM | Admit: 2017-01-16 | Discharge: 2017-01-16 | Disposition: A | Discharge status: Home / Self Care | Payer: Medicaid Other | Attending: Emergency Medicine | Admitting: Emergency Medicine

## 2017-01-16 ENCOUNTER — Encounter (HOSPITAL_COMMUNITY): Payer: Self-pay | Admitting: Emergency Medicine

## 2017-01-16 DIAGNOSIS — F17210 Nicotine dependence, cigarettes, uncomplicated: Secondary | ICD-10-CM | POA: Diagnosis not present

## 2017-01-16 DIAGNOSIS — I10 Essential (primary) hypertension: Secondary | ICD-10-CM | POA: Insufficient documentation

## 2017-01-16 DIAGNOSIS — R51 Headache: Secondary | ICD-10-CM | POA: Diagnosis not present

## 2017-01-16 DIAGNOSIS — R5383 Other fatigue: Secondary | ICD-10-CM | POA: Diagnosis not present

## 2017-01-16 DIAGNOSIS — R519 Headache, unspecified: Secondary | ICD-10-CM

## 2017-01-16 LAB — BASIC METABOLIC PANEL
ANION GAP: 7 (ref 5–15)
BUN: 13 mg/dL (ref 6–20)
CALCIUM: 8.9 mg/dL (ref 8.9–10.3)
CO2: 24 mmol/L (ref 22–32)
CREATININE: 0.56 mg/dL (ref 0.44–1.00)
Chloride: 109 mmol/L (ref 101–111)
Glucose, Bld: 111 mg/dL — ABNORMAL HIGH (ref 65–99)
Potassium: 3.8 mmol/L (ref 3.5–5.1)
SODIUM: 140 mmol/L (ref 135–145)

## 2017-01-16 LAB — CBC WITH DIFFERENTIAL/PLATELET
BASOS ABS: 0 10*3/uL (ref 0.0–0.1)
BASOS PCT: 0 %
Eosinophils Absolute: 0.4 10*3/uL (ref 0.0–0.7)
Eosinophils Relative: 4 %
HCT: 40.4 % (ref 36.0–46.0)
HEMOGLOBIN: 13.8 g/dL (ref 12.0–15.0)
Lymphocytes Relative: 34 %
Lymphs Abs: 3.5 10*3/uL (ref 0.7–4.0)
MCH: 30.1 pg (ref 26.0–34.0)
MCHC: 34.2 g/dL (ref 30.0–36.0)
MCV: 88 fL (ref 78.0–100.0)
MONOS PCT: 7 %
Monocytes Absolute: 0.8 10*3/uL (ref 0.1–1.0)
NEUTROS PCT: 55 %
Neutro Abs: 5.6 10*3/uL (ref 1.7–7.7)
Platelets: 284 10*3/uL (ref 150–400)
RBC: 4.59 MIL/uL (ref 3.87–5.11)
RDW: 13.8 % (ref 11.5–15.5)
WBC: 10.3 10*3/uL (ref 4.0–10.5)

## 2017-01-16 LAB — TSH: TSH: 1.327 u[IU]/mL (ref 0.350–4.500)

## 2017-01-16 MED ORDER — ONDANSETRON 4 MG PO TBDP: 4.0000 mg | ORAL_TABLET | Freq: Three times a day (TID) | ORAL | 0 refills | Status: DC | PRN

## 2017-01-16 MED ORDER — SODIUM CHLORIDE 0.9 % IV BOLUS (SEPSIS)
1000.0000 mL | Freq: Once | INTRAVENOUS | Status: AC
  Administered 2017-01-16: 1000 mL via INTRAVENOUS

## 2017-01-16 MED ORDER — KETOROLAC TROMETHAMINE 30 MG/ML IJ SOLN
30.0000 mg | Freq: Once | INTRAMUSCULAR | Status: AC
  Administered 2017-01-16: 30 mg via INTRAVENOUS
  Filled 2017-01-16: qty 1

## 2017-01-16 MED ORDER — KETOROLAC TROMETHAMINE 10 MG PO TABS: 10.0000 mg | ORAL_TABLET | Freq: Four times a day (QID) | ORAL | 0 refills | Status: DC | PRN

## 2017-01-16 MED ORDER — ONDANSETRON HCL 4 MG/2ML IJ SOLN
4.0000 mg | Freq: Once | INTRAMUSCULAR | Status: AC
  Administered 2017-01-16: 4 mg via INTRAVENOUS
  Filled 2017-01-16: qty 2

## 2017-01-16 NOTE — ED Notes (Signed)
Pt is no photophobic, conversant with relaxed facial features

## 2017-01-16 NOTE — ED Notes (Signed)
To CT  Pt reports recent Dx of bipolar DO and started on antidepressants Friday Pt wonders if HA is caused by new med

## 2017-01-16 NOTE — ED Provider Notes (Signed)
[facility] Provider Note   CSN: [id number] Arrival date & time: 01/16/17  1204     History   Chief Complaint Chief Complaint  Patient presents with  . Headache    HPI Robin Bender is a 41 y.o. female.  Pt presents to the ED with headache and elevated bp.  The pt does have a hx of htn, but has not required meds in years.  The pt said that she has taken tramadol, ibuprofen, and tylenol for her ha, but it is not getting better.  The pt does not usually have headaches that last 2 days.  Pt did take bp at home and sbp was 165.  Pt said she has been feeling very tired.      Past Medical History:  Diagnosis Date  . Anemia   . Anxiety   . Bipolar disorder (HCC)   . Depression   . Fatigue   . Fibromyalgia   . Headache   . Hypertension   . PTSD (post-traumatic stress disorder)     Patient Active Problem List   Diagnosis Date Noted  . PTSD (post-traumatic stress disorder) 01/10/2017  . Major depressive disorder, recurrent episode, moderate (HCC) 01/10/2017  . Generalized anxiety disorder 01/10/2017  . Obesity, morbid, BMI 40.0-49.9 (HCC) 11/19/2016  . Degenerative lumbar disc 11/19/2016  . 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 Condyloma acuminatum of vulva 10/07/2016  . Obstructive sleep apnea syndrome 04/04/2015  . Arthritis of both knees 06/02/2014  . Fibromyalgia 01/23/2014  . Chronic bilateral low back pain without sciatica 01/23/2014  . Breast pain 10/17/2012  . 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 ABDOMINAL HYSTERECTOMY    . APPENDECTOMY    . CARPAL TUNNEL RELEASE    . CESAREAN SECTION  2002, 2004  . LAPAROSCOPIC APPENDECTOMY N/A 05/19/2014   Procedure: APPENDECTOMY LAPAROSCOPIC;  Surgeon: Dalia Heading, MD;  Location: [facility];  Service: General;  Laterality: N/A;  . ovarian cyst removed      OB History    Gravida Para Term Preterm AB Living   2 2 2     2    SAB TAB Ectopic Multiple Live  Births                   Home Medications    Prior to Admission medications   Medication Sig Start Date End Date Taking? Authorizing Provider  ALPRAZolam Prudy Feeler) 0.5 MG tablet Take 0.5 mg by mouth daily as needed for anxiety or sleep.  11/19/16  Yes Historical Provider, MD  buPROPion (WELLBUTRIN XL) 150 MG 24 hr tablet TAKE 1 TABLET BY MOUTH EVERY MORNING 12/15/16  Yes Historical Provider, MD  diazepam (VALIUM) 5 MG tablet Take 5 mg by mouth daily as needed for anxiety.  12/13/16 12/13/17 Yes Historical Provider, MD  escitalopram (LEXAPRO) 10 MG tablet Take 1 tablet (10 mg total) by mouth daily. Take half tablet per day for first 4 days then one a day Patient taking differently: Take 5-10 mg by mouth daily. Take half tablet per day for first 4 days then one a day 01/12/17  Yes Thresa Ross, MD  estradiol (ESTRACE) 1 MG tablet Take 1 tablet (1 mg total) by mouth daily. 10/07/16 10/07/17 Yes Tilda Burrow, MD  ibuprofen (ADVIL,MOTRIN) 600 MG tablet Take 1 tablet (600 mg total) by mouth every 6 (six) hours as needed. 05/20/16  Yes Ivery Quale, PA-C  traMADol (ULTRAM) 50 MG tablet Take 1 tablet (50  mg total) by mouth every 6 (six) hours as needed. 05/20/16  Yes Ivery QualeHobson Bryant, PA-C  ketorolac (TORADOL) 10 MG tablet Take 1 tablet (10 mg total) by mouth every 6 (six) hours as needed. 01/16/17   Jacalyn LefevreJulie Kiyoto Slomski, MD  ondansetron (ZOFRAN ODT) 4 MG disintegrating tablet Take 1 tablet (4 mg total) by mouth every 8 (eight) hours as needed. 01/16/17   Jacalyn LefevreJulie Joniece Smotherman, MD    Family History Family History  Problem Relation Age of Onset  . Cancer Mother     cervical  . Hypertension Mother   . Heart disease Mother   . Arthritis/Rheumatoid Mother     Social History Social History  Substance Use Topics  . Smoking status: Current Every Day Smoker    Packs/day: 0.50    Years: 22.00    Types: Cigarettes  . Smokeless tobacco: Never Used     Comment: reduce the # of cigarettes   . Alcohol use No      Allergies   Ampicillin-sulbactam sodium; Codeine; and Demerol   Review of Systems Review of Systems  Constitutional: Positive for fatigue.  Neurological: Positive for headaches.  All other systems reviewed and are negative.    Physical Exam Updated Vital Signs BP 128/90   Pulse 86   Temp 98.3 F (36.8 C)   Resp 16   Ht 5\' 3"  (1.6 m)   Wt 264 lb (119.7 kg)   SpO2 98%   BMI 46.77 kg/m   Physical Exam  Constitutional: She is oriented to person, place, and time. She appears well-developed and well-nourished.  HENT:  Head: Normocephalic and atraumatic.  Right Ear: External ear normal.  Left Ear: External ear normal.  Nose: Nose normal.  Mouth/Throat: Oropharynx is clear and moist.  Eyes: Conjunctivae and EOM are normal. Pupils are equal, round, and reactive to light.  Neck: Normal range of motion. Neck supple.  Cardiovascular: Normal rate, regular rhythm, normal heart sounds and intact distal pulses.   Pulmonary/Chest: Effort normal and breath sounds normal.  Abdominal: Soft. Bowel sounds are normal.  Musculoskeletal: Normal range of motion.  Neurological: She is alert and oriented to person, place, and time.  Skin: Skin is warm.  Psychiatric: She has a normal mood and affect. Her behavior is normal. Judgment and thought content normal.  Nursing note and vitals reviewed.    ED Treatments / Results  Labs (all labs ordered are listed, but only abnormal results are displayed) Labs Reviewed  BASIC METABOLIC PANEL - Abnormal; Notable for the following:       Result Value   Glucose, Bld 111 (*)    All other components within normal limits  CBC WITH DIFFERENTIAL/PLATELET  TSH  URINALYSIS, ROUTINE W REFLEX MICROSCOPIC  PREGNANCY, URINE    EKG  EKG Interpretation  Date/Time:  Sunday January 16 2017 13:47:31 EDT Ventricular Rate:  78 PR Interval:    QRS Duration: 99 QT Interval:  404 QTC Calculation: 461 R Axis:   62 Text Interpretation:  Sinus rhythm Low  voltage, precordial leads Confirmed by Jamyla Ard MD, Jael Waldorf ([id number]) on 01/16/2017 2:41:18 PM       Radiology Ct Head Wo Contrast  Result Date: 01/16/2017 CLINICAL DATA:  Headache.  Hypertension.  No reported injury. EXAM: CT HEAD WITHOUT CONTRAST TECHNIQUE: Contiguous axial images were obtained from the base of the skull through the vertex without intravenous contrast. COMPARISON:  04/27/2016 head CT. FINDINGS: Brain: No evidence of parenchymal hemorrhage or extra-axial fluid collection. No mass lesion, mass effect, or midline  shift. No CT evidence of acute infarction. Cerebral volume is age appropriate. No ventriculomegaly. Vascular: No hyperdense vessel or unexpected calcification. Skull: No evidence of calvarial fracture. Sinuses/Orbits: The visualized paranasal sinuses are essentially clear. Other:  The mastoid air cells are unopacified. IMPRESSION: Negative head CT. Electronically Signed   By: Delbert Phenix M.D.   On: 01/16/2017 14:10    Procedures Procedures (including critical care time)  Medications Ordered in ED Medications  sodium chloride 0.9 % bolus 1,000 mL (1,000 mLs Intravenous New Bag/Given 01/16/17 1310)  ketorolac (TORADOL) 30 MG/ML injection 30 mg (30 mg Intravenous Given 01/16/17 1308)  ondansetron (ZOFRAN) injection 4 mg (4 mg Intravenous Given 01/16/17 1308)     Initial Impression / Assessment and Plan / ED Course  I have reviewed the triage vital signs and the nursing notes.  Pertinent labs & imaging results that were available during my care of the patient were reviewed by me and considered in my medical decision making (see chart for details).    SBP down to 114 with resolution of ha.  At this point, pt does not require medications for htn.  She knows to f/u with her pcp and to return if worse.  Final Clinical Impressions(s) / ED Diagnoses   Final diagnoses:  Acute nonintractable headache, unspecified headache type    New Prescriptions New Prescriptions    KETOROLAC (TORADOL) 10 MG TABLET    Take 1 tablet (10 mg total) by mouth every 6 (six) hours as needed.   ONDANSETRON (ZOFRAN ODT) 4 MG DISINTEGRATING TABLET    Take 1 tablet (4 mg total) by mouth every 8 (eight) hours as needed.     Jacalyn Lefevre, MD 01/16/17 [phone]

## 2017-01-16 NOTE — ED Triage Notes (Signed)
Patient c/o headache with HTN. Per patient headache started on Friday. Denies any neurological deficits. Patient states checked blood pressure today 161/101 today. Denies being on blood pressure medication.

## 2017-01-20 ENCOUNTER — Ambulatory Visit (HOSPITAL_COMMUNITY): Payer: Self-pay | Admitting: Licensed Clinical Social Worker

## 2017-02-02 ENCOUNTER — Ambulatory Visit (HOSPITAL_COMMUNITY): Payer: Self-pay | Admitting: Licensed Clinical Social Worker

## 2017-02-03 ENCOUNTER — Ambulatory Visit (HOSPITAL_COMMUNITY): Payer: Self-pay | Admitting: Psychiatry

## 2017-02-07 ENCOUNTER — Other Ambulatory Visit (HOSPITAL_COMMUNITY): Payer: Self-pay | Admitting: Psychiatry

## 2017-02-08 NOTE — Telephone Encounter (Signed)
Received fax from [facility] requesting refill for Lexapro. Per Dr. Gilmore Laroche, refill request for Lexapro , #30 is authorize. Rx was sent to pharmacy. Pt's next apt is schedule on 02/16/17. Lvm informing pt of refill status. Pt verbalizes understanding.

## 2017-02-16 ENCOUNTER — Ambulatory Visit (HOSPITAL_COMMUNITY): Payer: Self-pay | Admitting: Licensed Clinical Social Worker

## 2017-02-21 DIAGNOSIS — I10 Essential (primary) hypertension: Secondary | ICD-10-CM | POA: Insufficient documentation

## 2017-02-23 ENCOUNTER — Ambulatory Visit (HOSPITAL_COMMUNITY): Payer: Self-pay | Admitting: Psychiatry

## 2017-03-07 ENCOUNTER — Other Ambulatory Visit (HOSPITAL_COMMUNITY): Payer: Self-pay | Admitting: Psychiatry

## 2017-03-08 NOTE — Telephone Encounter (Signed)
Received fax from [facility] requesting a refill for Lexapro. Per Dr. Gilmore Laroche, refill is denied. Pt will need to schedule an apt. Lvm for pt to return call to office.

## 2017-03-31 ENCOUNTER — Emergency Department (HOSPITAL_COMMUNITY)
Admission: EM | Admit: 2017-03-31 | Discharge: 2017-03-31 | Disposition: A | Discharge status: Home / Self Care | Payer: Medicaid Other | Attending: Emergency Medicine | Admitting: Emergency Medicine

## 2017-03-31 ENCOUNTER — Encounter (HOSPITAL_COMMUNITY): Payer: Self-pay

## 2017-03-31 DIAGNOSIS — I10 Essential (primary) hypertension: Secondary | ICD-10-CM | POA: Insufficient documentation

## 2017-03-31 DIAGNOSIS — M545 Low back pain: Secondary | ICD-10-CM | POA: Diagnosis present

## 2017-03-31 DIAGNOSIS — Z79899 Other long term (current) drug therapy: Secondary | ICD-10-CM | POA: Insufficient documentation

## 2017-03-31 DIAGNOSIS — F17210 Nicotine dependence, cigarettes, uncomplicated: Secondary | ICD-10-CM | POA: Insufficient documentation

## 2017-03-31 DIAGNOSIS — M5442 Lumbago with sciatica, left side: Secondary | ICD-10-CM | POA: Diagnosis not present

## 2017-03-31 LAB — URINALYSIS, ROUTINE W REFLEX MICROSCOPIC
Bilirubin Urine: NEGATIVE
Glucose, UA: NEGATIVE mg/dL
Hgb urine dipstick: NEGATIVE
KETONES UR: NEGATIVE mg/dL
Leukocytes, UA: NEGATIVE
Nitrite: NEGATIVE
PH: 5 (ref 5.0–8.0)
Protein, ur: 30 mg/dL — AB
SPECIFIC GRAVITY, URINE: 1.03 (ref 1.005–1.030)

## 2017-03-31 MED ORDER — TRAMADOL HCL 50 MG PO TABS
50.0000 mg | ORAL_TABLET | Freq: Once | ORAL | Status: AC
  Administered 2017-03-31: 50 mg via ORAL
  Filled 2017-03-31: qty 1

## 2017-03-31 MED ORDER — METHOCARBAMOL 500 MG PO TABS: 500.0000 mg | ORAL_TABLET | Freq: Three times a day (TID) | ORAL | 0 refills | Status: DC

## 2017-03-31 MED ORDER — METHOCARBAMOL 500 MG PO TABS
500.0000 mg | ORAL_TABLET | Freq: Once | ORAL | Status: AC
  Administered 2017-03-31: 500 mg via ORAL
  Filled 2017-03-31: qty 1

## 2017-03-31 MED ORDER — TRAMADOL HCL 50 MG PO TABS: 50.0000 mg | ORAL_TABLET | Freq: Four times a day (QID) | ORAL | 0 refills | Status: DC | PRN

## 2017-03-31 NOTE — ED Triage Notes (Signed)
Pt. Got off work yesterday and came home. States she cleaned her house and after she sat down her back was sore. Went to work this morning and pain got worse. Pain is in the lower back more towards the left side and pain tingles down to both feet. Has taken an 800mg  ibuprofen with no relief.

## 2017-03-31 NOTE — ED Provider Notes (Signed)
[facility] Provider Note   CSN: [id number] Arrival date & time: 03/31/17  [id number]     History   Chief Complaint Chief Complaint  Patient presents with  . Back Pain    HPI Robin Bender is a 41 y.o. female.  HPI   Robin Bender is a 41 y.o. female who presents to the Emergency Department complaining of Left low back pain for 1 day. She states that she noticed "soreness" to her left lower back after work yesterday. Pain worse this morning upon waking. Pain is associated with movement especially bending or twisting. Pain improves at rest. She also describes an intermittent tingling down both legs to her feet and darker then usual urine this morning. She has taken ibuprofen this morning without relief. She denies known injury, abdominal pain, weakness or numbness to her lower extremities, urine or bowel changes.   Past Medical History:  Diagnosis Date  . Anemia   . Anxiety   . Bipolar disorder (HCC)   . Depression   . Fatigue   . Fibromyalgia   . Headache   . Hypertension   . PTSD (post-traumatic stress disorder)     Patient Active Problem List   Diagnosis Date Noted  . PTSD (post-traumatic stress disorder) 01/10/2017  . Major depressive disorder, recurrent episode, moderate (HCC) 01/10/2017  . Generalized anxiety disorder 01/10/2017  . Obesity, morbid, BMI 40.0-49.9 (HCC) 11/19/2016  . Degenerative lumbar disc 11/19/2016  . 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 Condyloma acuminatum of vulva 10/07/2016  . Obstructive sleep apnea syndrome 04/04/2015  . Arthritis of both knees 06/02/2014  . Fibromyalgia 01/23/2014  . Chronic bilateral low back pain without sciatica 01/23/2014  . Breast pain 10/17/2012  . 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 ABDOMINAL HYSTERECTOMY    . APPENDECTOMY    . CARPAL TUNNEL RELEASE    . CESAREAN SECTION  2002, 2004  . LAPAROSCOPIC APPENDECTOMY N/A 05/19/2014   Procedure:  APPENDECTOMY LAPAROSCOPIC;  Surgeon: Dalia HeadingMark A Jenkins, MD;  Location: [facility];  Service: General;  Laterality: N/A;  . ovarian cyst removed      OB History    Gravida Para Term Preterm AB Living   2 2 2     2    SAB TAB Ectopic Multiple Live Births                   Home Medications    Prior to Admission medications   Medication Sig Start Date End Date Taking? Authorizing Provider  ALPRAZolam Prudy Feeler(XANAX) 0.5 MG tablet Take 0.5 mg by mouth daily as needed for anxiety or sleep.  11/19/16   [provider]  buPROPion (WELLBUTRIN XL) 150 MG 24 hr tablet TAKE 1 TABLET BY MOUTH EVERY MORNING 12/15/16   [provider]  diazepam (VALIUM) 5 MG tablet Take 5 mg by mouth daily as needed for anxiety.  12/13/16 12/13/17  [provider]  escitalopram (LEXAPRO) 10 MG tablet TAKE 1 TABLET (10 MG TOTAL) BY MOUTH DAILY. TAKE HALF TABLET PER DAY FOR FIRST 4 DAYS THEN ONE A DAY 02/08/17   Thresa RossAkhtar, Nadeem, MD  estradiol (ESTRACE) 1 MG tablet Take 1 tablet (1 mg total) by mouth daily. 10/07/16 10/07/17  Tilda BurrowFerguson, John V, MD  ibuprofen (ADVIL,MOTRIN) 600 MG tablet Take 1 tablet (600 mg total) by mouth every 6 (six) hours as needed. 05/20/16   Ivery QualeBryant, Hobson, PA-C  ketorolac (TORADOL) 10 MG tablet Take 1 tablet (10  mg total) by mouth every 6 (six) hours as needed. 01/16/17   Jacalyn Lefevre, MD  ondansetron (ZOFRAN ODT) 4 MG disintegrating tablet Take 1 tablet (4 mg total) by mouth every 8 (eight) hours as needed. 01/16/17   Jacalyn Lefevre, MD  traMADol (ULTRAM) 50 MG tablet Take 1 tablet (50 mg total) by mouth every 6 (six) hours as needed. 05/20/16   Ivery Quale, PA-C    Family History Family History  Problem Relation Age of Onset  . Cancer Mother        cervical  . Hypertension Mother   . Heart disease Mother   . Arthritis/Rheumatoid Mother     Social History Social History  Substance Use Topics  . Smoking status: Current Every Day Smoker    Packs/day: 0.00    Years: 22.00    Types:  Cigarettes  . Smokeless tobacco: Never Used     Comment: reduce the # of cigarettes   . Alcohol use No     Allergies   Ampicillin-sulbactam sodium; Codeine; and Demerol   Review of Systems Review of Systems  Constitutional: Negative for fever.  Respiratory: Negative for shortness of breath.   Gastrointestinal: Negative for abdominal pain, constipation and vomiting.  Genitourinary: Negative for decreased urine volume, difficulty urinating, dysuria, flank pain and hematuria.  Musculoskeletal: Positive for back pain. Negative for joint swelling.  Skin: Negative for rash.  Neurological: Negative for weakness and numbness.  All other systems reviewed and are negative.    Physical Exam Updated Vital Signs BP 126/84 (BP Location: Right Arm)   Pulse (!) 102   Temp 97.8 F (36.6 C) (Oral)   Resp 15   Ht 5\' 3"  (1.6 m)   Wt 114.8 kg (253 lb)   SpO2 97%   BMI 44.82 kg/m   Physical Exam  Constitutional: She is oriented to person, place, and time. She appears well-developed and well-nourished. No distress.  HENT:  Head: Normocephalic and atraumatic.  Neck: Normal range of motion. Neck supple.  Cardiovascular: Normal rate, regular rhythm, normal heart sounds and intact distal pulses.   No murmur heard. Pulmonary/Chest: Effort normal and breath sounds normal. No respiratory distress.  Abdominal: Soft. She exhibits no distension. There is no tenderness.  Musculoskeletal: She exhibits tenderness. She exhibits no edema.       Lumbar back: She exhibits tenderness and pain. She exhibits normal range of motion, no swelling, no deformity, no laceration and normal pulse.  ttp of the left lumbar paraspinal muscles and SI joint.  No spinal tenderness. Positive SLR at 30 degrees on left.  Pt has 5/5 strength against resistance of bilateral lower extremities.     Neurological: She is alert and oriented to person, place, and time. She has normal strength. No sensory deficit. She exhibits normal  muscle tone. Coordination and gait normal.  Reflex Scores:      Patellar reflexes are 2+ on the right side and 2+ on the left side.      Achilles reflexes are 2+ on the right side and 2+ on the left side. Skin: Skin is warm and dry. No rash noted.  Nursing note and vitals reviewed.    ED Treatments / Results  Labs (all labs ordered are listed, but only abnormal results are displayed) Labs Reviewed  URINE CULTURE - Abnormal; Notable for the following:       Result Value   Culture   (*)    Value: <10,000 COLONIES/mL INSIGNIFICANT GROWTH Performed at [facility],  [address], [place], [place] [address]    All other components within normal limits  URINALYSIS, ROUTINE W REFLEX MICROSCOPIC - Abnormal; Notable for the following:    APPearance CLOUDY (*)    Protein, ur 30 (*)    Bacteria, UA RARE (*)    Squamous Epithelial / LPF TOO NUMEROUS TO COUNT (*)    All other components within normal limits    EKG  EKG Interpretation None       Radiology No results found.  Procedures Procedures (including critical care time)  Medications Ordered in ED Medications - No data to display   Initial Impression / Assessment and Plan / ED Course  I have reviewed the triage vital signs and the nursing notes.  Pertinent labs & imaging results that were available during my care of the patient were reviewed by me and considered in my medical decision making (see chart for details).     Pt with left low back pain.  Ambulates with steady gait.  No focal neuro deficits.  No concerning sx's for emergent neurological or infectious process.  Appears stable for d/c, agrees to tx plan and PCP f/u.    Final Clinical Impressions(s) / ED Diagnoses   Final diagnoses:  Acute left-sided low back pain with left-sided sciatica    New Prescriptions Discharge Medication List as of 03/31/2017 12:06 PM    START taking these medications   Details  methocarbamol (ROBAXIN) 500 MG tablet Take 1  tablet (500 mg total) by mouth 3 (three) times daily., Starting Thu 03/31/2017, [address], [place], PA-C 04/02/17 1752    Mancel Bale, MD 04/05/17 [phone]

## 2017-03-31 NOTE — Discharge Instructions (Signed)
Alternate ice and heat to your back. Follow-up with your primary provider for recheck if needed.

## 2017-04-02 LAB — URINE CULTURE: Culture: <10000 — AB

## 2017-07-04 ENCOUNTER — Emergency Department (HOSPITAL_COMMUNITY)
Admission: EM | Admit: 2017-07-04 | Discharge: 2017-07-04 | Discharge status: Against Medical Advice | Payer: Medicaid Other

## 2017-07-04 NOTE — ED Notes (Signed)
Patient left ED per registration. 

## 2017-07-05 ENCOUNTER — Encounter (HOSPITAL_COMMUNITY): Payer: Self-pay | Admitting: Cardiology

## 2017-07-05 ENCOUNTER — Emergency Department (HOSPITAL_COMMUNITY)
Admission: EM | Admit: 2017-07-05 | Discharge: 2017-07-05 | Disposition: A | Discharge status: Home / Self Care | Payer: Medicaid Other | Attending: Emergency Medicine | Admitting: Emergency Medicine

## 2017-07-05 ENCOUNTER — Emergency Department (HOSPITAL_COMMUNITY): Payer: Medicaid Other

## 2017-07-05 DIAGNOSIS — Y929 Unspecified place or not applicable: Secondary | ICD-10-CM | POA: Insufficient documentation

## 2017-07-05 DIAGNOSIS — Z79899 Other long term (current) drug therapy: Secondary | ICD-10-CM | POA: Insufficient documentation

## 2017-07-05 DIAGNOSIS — S93602A Unspecified sprain of left foot, initial encounter: Secondary | ICD-10-CM

## 2017-07-05 DIAGNOSIS — Y999 Unspecified external cause status: Secondary | ICD-10-CM | POA: Insufficient documentation

## 2017-07-05 DIAGNOSIS — I10 Essential (primary) hypertension: Secondary | ICD-10-CM | POA: Diagnosis not present

## 2017-07-05 DIAGNOSIS — X58XXXA Exposure to other specified factors, initial encounter: Secondary | ICD-10-CM | POA: Diagnosis not present

## 2017-07-05 DIAGNOSIS — Y939 Activity, unspecified: Secondary | ICD-10-CM | POA: Insufficient documentation

## 2017-07-05 DIAGNOSIS — S99922A Unspecified injury of left foot, initial encounter: Secondary | ICD-10-CM | POA: Diagnosis present

## 2017-07-05 DIAGNOSIS — F17210 Nicotine dependence, cigarettes, uncomplicated: Secondary | ICD-10-CM | POA: Diagnosis not present

## 2017-07-05 NOTE — ED Triage Notes (Signed)
Twisted left foot yesterday.  C/o pain.

## 2017-07-05 NOTE — Discharge Instructions (Signed)
Elevate and apply ice packs on/off to your foot.  Ibuprofen if needed for pain.  Call Dr. Mort Sawyers office in one week if not improving

## 2017-07-05 NOTE — ED Provider Notes (Signed)
[facility] Provider Note   CSN: [id number] Arrival date & time: 07/05/17  0732     History   Chief Complaint Chief Complaint  Patient presents with  . Foot Injury    HPI Robin Bender is a 41 y.o. female.  HPI   Robin Bender is a 41 y.o. female who presents to the Emergency Department complaining of left foot pain for one day.  States that she "twisted" her foot yesterday.  She describes a sharp throbbing pain across the top of her foot and through her arch. Pain is worse with weightbearing and flexion of the foot. She states that she propped her foot up last night and wore an Ace bandage without relief.  She denies numbness, calf pain, knee pain, and other injuries.   Past Medical History:  Diagnosis Date  . Anemia   . Anxiety   . Bipolar disorder (HCC)   . Depression   . Fatigue   . Fibromyalgia   . Headache   . Hypertension   . PTSD (post-traumatic stress disorder)     Patient Active Problem List   Diagnosis Date Noted  . PTSD (post-traumatic stress disorder) 01/10/2017  . Major depressive disorder, recurrent episode, moderate (HCC) 01/10/2017  . Generalized anxiety disorder 01/10/2017  . Obesity, morbid, BMI 40.0-49.9 (HCC) 11/19/2016  . Degenerative lumbar disc 11/19/2016  . 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 Condyloma acuminatum of vulva 10/07/2016  . Obstructive sleep apnea syndrome 04/04/2015  . Arthritis of both knees 06/02/2014  . Fibromyalgia 01/23/2014  . Chronic bilateral low back pain without sciatica 01/23/2014  . Breast pain 10/17/2012  . 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 ABDOMINAL HYSTERECTOMY    . APPENDECTOMY    . CARPAL TUNNEL RELEASE    . CESAREAN SECTION  2002, 2004  . LAPAROSCOPIC APPENDECTOMY N/A 05/19/2014   Procedure: APPENDECTOMY LAPAROSCOPIC;  Surgeon: Dalia Heading, MD;  Location: [facility];  Service: General;  Laterality: N/A;  . ovarian cyst removed       OB History    Gravida Para Term Preterm AB Living   2 2 2     2    SAB TAB Ectopic Multiple Live Births                   Home Medications    Prior to Admission medications   Medication Sig Start Date End Date Taking? Authorizing Provider  ALPRAZolam Prudy Feeler) 0.5 MG tablet Take 0.5 mg by mouth daily as needed for anxiety or sleep.  11/19/16  Yes [provider]  buPROPion (WELLBUTRIN XL) 150 MG 24 hr tablet TAKE 1 TABLET BY MOUTH EVERY MORNING 12/15/16  Yes [provider]  diazepam (VALIUM) 5 MG tablet Take 5 mg by mouth daily as needed for anxiety.  12/13/16 12/13/17 Yes [provider]  estradiol (ESTRACE) 1 MG tablet Take 1 tablet (1 mg total) by mouth daily. 10/07/16 10/07/17 Yes Tilda Burrow, MD  hydrochlorothiazide (MICROZIDE) 12.5 MG capsule Take 1 capsule by mouth daily. 02/21/17 02/21/18 Yes [provider]  ibuprofen (ADVIL,MOTRIN) 200 MG tablet Take 600 mg by mouth every 6 (six) hours as needed for fever, mild pain, moderate pain or cramping.   Yes [provider]  methocarbamol (ROBAXIN) 500 MG tablet Take 1 tablet (500 mg total) by mouth 3 (three) times daily. 03/31/17  Yes Timoth Schara, PA-C  traMADol (ULTRAM) 50 MG tablet Take 1 tablet (50 mg total)  by mouth every 6 (six) hours as needed. 03/31/17  Yes Leonda Cristo, PA-C  vitamin B-12 (CYANOCOBALAMIN) 100 MCG tablet Take 100 mcg by mouth daily.   Yes [provider]    Family History Family History  Problem Relation Age of Onset  . Cancer Mother        cervical  . Hypertension Mother   . Heart disease Mother   . Arthritis/Rheumatoid Mother     Social History Social History  Substance Use Topics  . Smoking status: Current Every Day Smoker    Packs/day: 0.00    Years: 22.00    Types: Cigarettes  . Smokeless tobacco: Never Used     Comment: reduce the # of cigarettes   . Alcohol use No     Allergies   Ampicillin-sulbactam sodium; Codeine; and  Demerol   Review of Systems Review of Systems  Constitutional: Negative for chills and fever.  Gastrointestinal: Negative for nausea and vomiting.  Musculoskeletal: Positive for arthralgias (left foot pain ) and joint swelling. Negative for neck pain.  Skin: Negative for color change and wound.  Neurological: Negative for weakness and numbness.  All other systems reviewed and are negative.    Physical Exam Updated Vital Signs BP (!) 138/98   Pulse 81   Temp 98.4 F (36.9 C) (Oral)   Resp 16   Ht 5\' 3"  (1.6 m)   Wt 115.2 kg (254 lb)   SpO2 95%   BMI 44.99 kg/m   Physical Exam  Constitutional: She is oriented to person, place, and time. She appears well-developed and well-nourished. No distress.  HENT:  Head: Normocephalic and atraumatic.  Cardiovascular: Normal rate, regular rhythm and intact distal pulses.   Pulmonary/Chest: Effort normal and breath sounds normal.  Musculoskeletal: She exhibits edema and tenderness. She exhibits no deformity.  ttp of the dorsal left foot and medial arch of the foot.  Moderate edema laterally.    Neurological: She is alert and oriented to person, place, and time. No sensory deficit. She exhibits normal muscle tone. Coordination normal.  Skin: Skin is warm and dry. Capillary refill takes less than 2 seconds.  Nursing note and vitals reviewed.    ED Treatments / Results  Labs (all labs ordered are listed, but only abnormal results are displayed) Labs Reviewed - No data to display  EKG  EKG Interpretation None        Radiology Dg Foot Complete Left  Result Date: 07/05/2017 CLINICAL DATA:  Twisted left ankle/ foot yesterday. Pain for walking. EXAM: LEFT FOOT - COMPLETE 3+ VIEW COMPARISON:  None. FINDINGS: Soft tissue swelling seen laterally on the [facility] view. No acute fracture. Ossicle about the hindfoot//midfoot is somewhat lateral compared usual. No bony dislocation. IMPRESSION: 1. Soft tissue swelling without acute fracture. 2.  Somewhat lateral positioning of lateral ossicle, consider peroneus ligament injury. Electronically Signed   By: Marnee Spring M.D.   On: 07/05/2017 08:14    Procedures Procedures (including critical care time)  Medications Ordered in ED Medications - No data to display   Initial Impression / Assessment and Plan / ED Course  I have reviewed the triage vital signs and the nursing notes.  Pertinent labs & imaging results that were available during my care of the patient were reviewed by me and considered in my medical decision making (see chart for details).      Patient with soft tissue swelling restarted the lateral foot x-ray negative for fracture. Neurovascularly intact. Patient prefers an ASO and  crutches over cam walker.   agrees to RICE therapy and close orthopedic follow-up.  Ibuprofen for pain   Final Clinical Impressions(s) / ED Diagnoses   Final diagnoses:  Sprain of left foot, initial encounter    New Prescriptions New Prescriptions   No medications on file     Pauline Aus, PA-C 07/05/17 0914    Vanetta Mulders, MD 07/06/17 1007

## 2017-10-24 ENCOUNTER — Other Ambulatory Visit: Payer: Self-pay | Admitting: Obstetrics and Gynecology

## 2017-12-13 DIAGNOSIS — G8929 Other chronic pain: Secondary | ICD-10-CM | POA: Diagnosis not present

## 2017-12-13 DIAGNOSIS — M47817 Spondylosis without myelopathy or radiculopathy, lumbosacral region: Secondary | ICD-10-CM | POA: Diagnosis not present

## 2017-12-13 DIAGNOSIS — M25511 Pain in right shoulder: Secondary | ICD-10-CM | POA: Diagnosis not present

## 2017-12-13 DIAGNOSIS — I10 Essential (primary) hypertension: Secondary | ICD-10-CM | POA: Diagnosis not present

## 2017-12-13 DIAGNOSIS — M545 Low back pain: Secondary | ICD-10-CM | POA: Diagnosis not present

## 2017-12-13 DIAGNOSIS — F3341 Major depressive disorder, recurrent, in partial remission: Secondary | ICD-10-CM | POA: Diagnosis not present

## 2018-01-04 DIAGNOSIS — M47816 Spondylosis without myelopathy or radiculopathy, lumbar region: Secondary | ICD-10-CM | POA: Diagnosis not present

## 2018-01-04 DIAGNOSIS — M545 Low back pain: Secondary | ICD-10-CM | POA: Diagnosis not present

## 2018-01-04 DIAGNOSIS — G8929 Other chronic pain: Secondary | ICD-10-CM | POA: Insufficient documentation

## 2018-01-04 DIAGNOSIS — M47812 Spondylosis without myelopathy or radiculopathy, cervical region: Secondary | ICD-10-CM | POA: Diagnosis not present

## 2018-01-04 DIAGNOSIS — M5136 Other intervertebral disc degeneration, lumbar region: Secondary | ICD-10-CM | POA: Diagnosis not present

## 2018-01-13 DIAGNOSIS — M79605 Pain in left leg: Secondary | ICD-10-CM | POA: Diagnosis not present

## 2018-01-13 DIAGNOSIS — M6281 Muscle weakness (generalized): Secondary | ICD-10-CM | POA: Diagnosis not present

## 2018-01-13 DIAGNOSIS — M79604 Pain in right leg: Secondary | ICD-10-CM | POA: Diagnosis not present

## 2018-01-13 DIAGNOSIS — M549 Dorsalgia, unspecified: Secondary | ICD-10-CM | POA: Diagnosis not present

## 2018-01-17 DIAGNOSIS — M79604 Pain in right leg: Secondary | ICD-10-CM | POA: Diagnosis not present

## 2018-01-17 DIAGNOSIS — M549 Dorsalgia, unspecified: Secondary | ICD-10-CM | POA: Diagnosis not present

## 2018-01-17 DIAGNOSIS — M79605 Pain in left leg: Secondary | ICD-10-CM | POA: Diagnosis not present

## 2018-01-17 DIAGNOSIS — M6281 Muscle weakness (generalized): Secondary | ICD-10-CM | POA: Diagnosis not present

## 2018-01-18 DIAGNOSIS — M79605 Pain in left leg: Secondary | ICD-10-CM | POA: Diagnosis not present

## 2018-01-18 DIAGNOSIS — M549 Dorsalgia, unspecified: Secondary | ICD-10-CM | POA: Diagnosis not present

## 2018-01-18 DIAGNOSIS — M6281 Muscle weakness (generalized): Secondary | ICD-10-CM | POA: Diagnosis not present

## 2018-01-18 DIAGNOSIS — M79604 Pain in right leg: Secondary | ICD-10-CM | POA: Diagnosis not present

## 2018-01-20 DIAGNOSIS — M549 Dorsalgia, unspecified: Secondary | ICD-10-CM | POA: Diagnosis not present

## 2018-01-20 DIAGNOSIS — M79605 Pain in left leg: Secondary | ICD-10-CM | POA: Diagnosis not present

## 2018-01-20 DIAGNOSIS — M6281 Muscle weakness (generalized): Secondary | ICD-10-CM | POA: Diagnosis not present

## 2018-01-20 DIAGNOSIS — M79604 Pain in right leg: Secondary | ICD-10-CM | POA: Diagnosis not present

## 2018-01-23 DIAGNOSIS — M79605 Pain in left leg: Secondary | ICD-10-CM | POA: Diagnosis not present

## 2018-01-23 DIAGNOSIS — M6281 Muscle weakness (generalized): Secondary | ICD-10-CM | POA: Diagnosis not present

## 2018-01-23 DIAGNOSIS — M79604 Pain in right leg: Secondary | ICD-10-CM | POA: Diagnosis not present

## 2018-01-23 DIAGNOSIS — M549 Dorsalgia, unspecified: Secondary | ICD-10-CM | POA: Diagnosis not present

## 2018-01-25 DIAGNOSIS — M549 Dorsalgia, unspecified: Secondary | ICD-10-CM | POA: Diagnosis not present

## 2018-01-25 DIAGNOSIS — M79605 Pain in left leg: Secondary | ICD-10-CM | POA: Diagnosis not present

## 2018-01-25 DIAGNOSIS — M6281 Muscle weakness (generalized): Secondary | ICD-10-CM | POA: Diagnosis not present

## 2018-01-25 DIAGNOSIS — M79604 Pain in right leg: Secondary | ICD-10-CM | POA: Diagnosis not present

## 2018-01-31 DIAGNOSIS — M79605 Pain in left leg: Secondary | ICD-10-CM | POA: Diagnosis not present

## 2018-01-31 DIAGNOSIS — M6281 Muscle weakness (generalized): Secondary | ICD-10-CM | POA: Diagnosis not present

## 2018-01-31 DIAGNOSIS — Z6841 Body Mass Index (BMI) 40.0 and over, adult: Secondary | ICD-10-CM | POA: Diagnosis not present

## 2018-01-31 DIAGNOSIS — M79604 Pain in right leg: Secondary | ICD-10-CM | POA: Diagnosis not present

## 2018-01-31 DIAGNOSIS — M25511 Pain in right shoulder: Secondary | ICD-10-CM | POA: Diagnosis not present

## 2018-01-31 DIAGNOSIS — M549 Dorsalgia, unspecified: Secondary | ICD-10-CM | POA: Diagnosis not present

## 2018-01-31 DIAGNOSIS — M545 Low back pain: Secondary | ICD-10-CM | POA: Diagnosis not present

## 2018-02-03 DIAGNOSIS — M79604 Pain in right leg: Secondary | ICD-10-CM | POA: Diagnosis not present

## 2018-02-03 DIAGNOSIS — M549 Dorsalgia, unspecified: Secondary | ICD-10-CM | POA: Diagnosis not present

## 2018-02-03 DIAGNOSIS — M79605 Pain in left leg: Secondary | ICD-10-CM | POA: Diagnosis not present

## 2018-02-03 DIAGNOSIS — M6281 Muscle weakness (generalized): Secondary | ICD-10-CM | POA: Diagnosis not present

## 2018-02-07 DIAGNOSIS — M549 Dorsalgia, unspecified: Secondary | ICD-10-CM | POA: Diagnosis not present

## 2018-02-07 DIAGNOSIS — M79604 Pain in right leg: Secondary | ICD-10-CM | POA: Diagnosis not present

## 2018-02-07 DIAGNOSIS — M79605 Pain in left leg: Secondary | ICD-10-CM | POA: Diagnosis not present

## 2018-02-07 DIAGNOSIS — M6281 Muscle weakness (generalized): Secondary | ICD-10-CM | POA: Diagnosis not present

## 2018-02-09 DIAGNOSIS — M6281 Muscle weakness (generalized): Secondary | ICD-10-CM | POA: Diagnosis not present

## 2018-02-09 DIAGNOSIS — M79605 Pain in left leg: Secondary | ICD-10-CM | POA: Diagnosis not present

## 2018-02-09 DIAGNOSIS — M549 Dorsalgia, unspecified: Secondary | ICD-10-CM | POA: Diagnosis not present

## 2018-02-09 DIAGNOSIS — M79604 Pain in right leg: Secondary | ICD-10-CM | POA: Diagnosis not present

## 2018-02-13 DIAGNOSIS — M549 Dorsalgia, unspecified: Secondary | ICD-10-CM | POA: Diagnosis not present

## 2018-02-13 DIAGNOSIS — M6281 Muscle weakness (generalized): Secondary | ICD-10-CM | POA: Diagnosis not present

## 2018-02-13 DIAGNOSIS — M79605 Pain in left leg: Secondary | ICD-10-CM | POA: Diagnosis not present

## 2018-02-13 DIAGNOSIS — M79604 Pain in right leg: Secondary | ICD-10-CM | POA: Diagnosis not present

## 2018-02-23 DIAGNOSIS — Z6841 Body Mass Index (BMI) 40.0 and over, adult: Secondary | ICD-10-CM | POA: Diagnosis not present

## 2018-02-23 DIAGNOSIS — F329 Major depressive disorder, single episode, unspecified: Secondary | ICD-10-CM | POA: Diagnosis not present

## 2018-02-23 DIAGNOSIS — M545 Low back pain: Secondary | ICD-10-CM | POA: Diagnosis not present

## 2018-03-23 DIAGNOSIS — Z1331 Encounter for screening for depression: Secondary | ICD-10-CM | POA: Diagnosis not present

## 2018-03-23 DIAGNOSIS — E782 Mixed hyperlipidemia: Secondary | ICD-10-CM | POA: Diagnosis not present

## 2018-03-23 DIAGNOSIS — M545 Low back pain: Secondary | ICD-10-CM | POA: Diagnosis not present

## 2018-03-23 DIAGNOSIS — M5410 Radiculopathy, site unspecified: Secondary | ICD-10-CM | POA: Diagnosis not present

## 2018-03-23 DIAGNOSIS — Z1389 Encounter for screening for other disorder: Secondary | ICD-10-CM | POA: Diagnosis not present

## 2018-03-23 DIAGNOSIS — F329 Major depressive disorder, single episode, unspecified: Secondary | ICD-10-CM | POA: Diagnosis not present

## 2018-04-08 DIAGNOSIS — J45901 Unspecified asthma with (acute) exacerbation: Secondary | ICD-10-CM | POA: Diagnosis not present

## 2018-04-08 DIAGNOSIS — Z87891 Personal history of nicotine dependence: Secondary | ICD-10-CM | POA: Diagnosis not present

## 2018-04-08 DIAGNOSIS — R05 Cough: Secondary | ICD-10-CM | POA: Diagnosis not present

## 2018-04-08 DIAGNOSIS — R0989 Other specified symptoms and signs involving the circulatory and respiratory systems: Secondary | ICD-10-CM | POA: Diagnosis not present

## 2018-04-08 DIAGNOSIS — Z79899 Other long term (current) drug therapy: Secondary | ICD-10-CM | POA: Diagnosis not present

## 2018-04-08 DIAGNOSIS — M797 Fibromyalgia: Secondary | ICD-10-CM | POA: Diagnosis not present

## 2018-04-12 DIAGNOSIS — E559 Vitamin D deficiency, unspecified: Secondary | ICD-10-CM | POA: Insufficient documentation

## 2018-04-14 ENCOUNTER — Other Ambulatory Visit: Payer: Self-pay | Admitting: Obstetrics and Gynecology

## 2018-04-28 DIAGNOSIS — M545 Low back pain: Secondary | ICD-10-CM | POA: Diagnosis not present

## 2018-04-28 DIAGNOSIS — Z6841 Body Mass Index (BMI) 40.0 and over, adult: Secondary | ICD-10-CM | POA: Diagnosis not present

## 2018-04-28 DIAGNOSIS — B001 Herpesviral vesicular dermatitis: Secondary | ICD-10-CM | POA: Diagnosis not present

## 2018-05-02 DIAGNOSIS — M5126 Other intervertebral disc displacement, lumbar region: Secondary | ICD-10-CM | POA: Diagnosis not present

## 2018-05-02 DIAGNOSIS — M48061 Spinal stenosis, lumbar region without neurogenic claudication: Secondary | ICD-10-CM | POA: Diagnosis not present

## 2018-05-02 DIAGNOSIS — M5410 Radiculopathy, site unspecified: Secondary | ICD-10-CM | POA: Diagnosis not present

## 2018-05-02 DIAGNOSIS — M545 Low back pain: Secondary | ICD-10-CM | POA: Diagnosis not present

## 2018-05-03 DIAGNOSIS — M5126 Other intervertebral disc displacement, lumbar region: Secondary | ICD-10-CM | POA: Insufficient documentation

## 2018-05-30 DIAGNOSIS — M545 Low back pain: Secondary | ICD-10-CM | POA: Diagnosis not present

## 2018-06-01 DIAGNOSIS — M5126 Other intervertebral disc displacement, lumbar region: Secondary | ICD-10-CM | POA: Diagnosis not present

## 2018-06-01 DIAGNOSIS — M5416 Radiculopathy, lumbar region: Secondary | ICD-10-CM | POA: Diagnosis not present

## 2018-06-01 DIAGNOSIS — Z6841 Body Mass Index (BMI) 40.0 and over, adult: Secondary | ICD-10-CM | POA: Diagnosis not present

## 2018-06-01 DIAGNOSIS — M461 Sacroiliitis, not elsewhere classified: Secondary | ICD-10-CM | POA: Diagnosis not present

## 2018-06-02 DIAGNOSIS — J209 Acute bronchitis, unspecified: Secondary | ICD-10-CM | POA: Diagnosis not present

## 2018-06-02 DIAGNOSIS — M545 Low back pain: Secondary | ICD-10-CM | POA: Diagnosis not present

## 2018-06-28 DIAGNOSIS — M5410 Radiculopathy, site unspecified: Secondary | ICD-10-CM | POA: Diagnosis not present

## 2018-06-28 DIAGNOSIS — F329 Major depressive disorder, single episode, unspecified: Secondary | ICD-10-CM | POA: Diagnosis not present

## 2018-06-28 DIAGNOSIS — M545 Low back pain: Secondary | ICD-10-CM | POA: Diagnosis not present

## 2018-07-07 DIAGNOSIS — M5416 Radiculopathy, lumbar region: Secondary | ICD-10-CM | POA: Diagnosis not present

## 2018-07-07 DIAGNOSIS — M5126 Other intervertebral disc displacement, lumbar region: Secondary | ICD-10-CM | POA: Diagnosis not present

## 2018-07-26 DIAGNOSIS — M5416 Radiculopathy, lumbar region: Secondary | ICD-10-CM | POA: Diagnosis not present

## 2018-07-31 DIAGNOSIS — Z23 Encounter for immunization: Secondary | ICD-10-CM | POA: Diagnosis not present

## 2018-08-18 DIAGNOSIS — M797 Fibromyalgia: Secondary | ICD-10-CM | POA: Diagnosis not present

## 2018-08-18 DIAGNOSIS — F17200 Nicotine dependence, unspecified, uncomplicated: Secondary | ICD-10-CM | POA: Diagnosis not present

## 2018-08-18 DIAGNOSIS — G8929 Other chronic pain: Secondary | ICD-10-CM | POA: Diagnosis not present

## 2018-08-18 DIAGNOSIS — M5441 Lumbago with sciatica, right side: Secondary | ICD-10-CM | POA: Diagnosis not present

## 2018-08-18 DIAGNOSIS — Z79899 Other long term (current) drug therapy: Secondary | ICD-10-CM | POA: Diagnosis not present

## 2018-08-18 DIAGNOSIS — M545 Low back pain: Secondary | ICD-10-CM | POA: Diagnosis not present

## 2018-08-24 DIAGNOSIS — M5126 Other intervertebral disc displacement, lumbar region: Secondary | ICD-10-CM | POA: Diagnosis not present

## 2018-08-24 DIAGNOSIS — M461 Sacroiliitis, not elsewhere classified: Secondary | ICD-10-CM | POA: Diagnosis not present

## 2018-08-24 DIAGNOSIS — F1120 Opioid dependence, uncomplicated: Secondary | ICD-10-CM | POA: Diagnosis not present

## 2018-08-24 DIAGNOSIS — M5416 Radiculopathy, lumbar region: Secondary | ICD-10-CM | POA: Diagnosis not present

## 2018-09-12 DIAGNOSIS — M5126 Other intervertebral disc displacement, lumbar region: Secondary | ICD-10-CM | POA: Diagnosis not present

## 2018-09-12 DIAGNOSIS — M545 Low back pain: Secondary | ICD-10-CM | POA: Diagnosis not present

## 2018-09-14 DIAGNOSIS — H109 Unspecified conjunctivitis: Secondary | ICD-10-CM | POA: Diagnosis not present

## 2018-09-14 DIAGNOSIS — J019 Acute sinusitis, unspecified: Secondary | ICD-10-CM | POA: Diagnosis not present

## 2018-09-14 DIAGNOSIS — M79641 Pain in right hand: Secondary | ICD-10-CM | POA: Diagnosis not present

## 2018-09-14 DIAGNOSIS — R0683 Snoring: Secondary | ICD-10-CM | POA: Diagnosis not present

## 2018-09-18 ENCOUNTER — Ambulatory Visit (INDEPENDENT_AMBULATORY_CARE_PROVIDER_SITE_OTHER): Payer: Self-pay | Admitting: Otolaryngology

## 2018-09-18 DIAGNOSIS — M5126 Other intervertebral disc displacement, lumbar region: Secondary | ICD-10-CM | POA: Diagnosis not present

## 2018-09-18 DIAGNOSIS — F1120 Opioid dependence, uncomplicated: Secondary | ICD-10-CM | POA: Diagnosis not present

## 2018-09-18 DIAGNOSIS — Z79899 Other long term (current) drug therapy: Secondary | ICD-10-CM | POA: Diagnosis not present

## 2018-09-18 DIAGNOSIS — M5416 Radiculopathy, lumbar region: Secondary | ICD-10-CM | POA: Diagnosis not present

## 2018-10-11 DIAGNOSIS — Z6841 Body Mass Index (BMI) 40.0 and over, adult: Secondary | ICD-10-CM | POA: Diagnosis not present

## 2018-10-11 DIAGNOSIS — M654 Radial styloid tenosynovitis [de Quervain]: Secondary | ICD-10-CM | POA: Diagnosis not present

## 2018-10-12 ENCOUNTER — Ambulatory Visit (INDEPENDENT_AMBULATORY_CARE_PROVIDER_SITE_OTHER): Payer: Self-pay | Admitting: Otolaryngology

## 2018-11-03 DIAGNOSIS — Z6841 Body Mass Index (BMI) 40.0 and over, adult: Secondary | ICD-10-CM | POA: Diagnosis not present

## 2018-11-03 DIAGNOSIS — J209 Acute bronchitis, unspecified: Secondary | ICD-10-CM | POA: Diagnosis not present

## 2018-11-03 DIAGNOSIS — J019 Acute sinusitis, unspecified: Secondary | ICD-10-CM | POA: Diagnosis not present

## 2018-11-03 DIAGNOSIS — M654 Radial styloid tenosynovitis [de Quervain]: Secondary | ICD-10-CM | POA: Diagnosis not present

## 2018-11-21 DIAGNOSIS — M5126 Other intervertebral disc displacement, lumbar region: Secondary | ICD-10-CM | POA: Diagnosis not present

## 2018-11-21 DIAGNOSIS — Z79899 Other long term (current) drug therapy: Secondary | ICD-10-CM | POA: Diagnosis not present

## 2018-11-21 DIAGNOSIS — F1120 Opioid dependence, uncomplicated: Secondary | ICD-10-CM | POA: Diagnosis not present

## 2018-11-21 DIAGNOSIS — M5416 Radiculopathy, lumbar region: Secondary | ICD-10-CM | POA: Diagnosis not present

## 2018-11-22 DIAGNOSIS — M654 Radial styloid tenosynovitis [de Quervain]: Secondary | ICD-10-CM | POA: Diagnosis not present

## 2018-11-22 DIAGNOSIS — Z6841 Body Mass Index (BMI) 40.0 and over, adult: Secondary | ICD-10-CM | POA: Diagnosis not present

## 2019-07-02 ENCOUNTER — Other Ambulatory Visit: Payer: Self-pay

## 2019-07-02 DIAGNOSIS — Z20822 Contact with and (suspected) exposure to covid-19: Secondary | ICD-10-CM

## 2019-07-03 LAB — NOVEL CORONAVIRUS, NAA: SARS-CoV-2, NAA: NOT DETECTED

## 2019-10-23 ENCOUNTER — Other Ambulatory Visit: Payer: Self-pay | Admitting: Physical Medicine and Rehabilitation

## 2019-10-23 DIAGNOSIS — M5126 Other intervertebral disc displacement, lumbar region: Secondary | ICD-10-CM

## 2019-10-28 ENCOUNTER — Emergency Department (HOSPITAL_COMMUNITY)
Admission: EM | Admit: 2019-10-28 | Discharge: 2019-10-28 | Disposition: A | Discharge status: Home / Self Care | Payer: Medicaid Other | Attending: Emergency Medicine | Admitting: Emergency Medicine

## 2019-10-28 ENCOUNTER — Emergency Department (HOSPITAL_COMMUNITY): Payer: Medicaid Other

## 2019-10-28 ENCOUNTER — Encounter (HOSPITAL_COMMUNITY): Payer: Self-pay

## 2019-10-28 ENCOUNTER — Other Ambulatory Visit: Payer: Self-pay

## 2019-10-28 DIAGNOSIS — M545 Low back pain, unspecified: Secondary | ICD-10-CM

## 2019-10-28 DIAGNOSIS — Z79899 Other long term (current) drug therapy: Secondary | ICD-10-CM | POA: Insufficient documentation

## 2019-10-28 DIAGNOSIS — I10 Essential (primary) hypertension: Secondary | ICD-10-CM | POA: Insufficient documentation

## 2019-10-28 DIAGNOSIS — F17210 Nicotine dependence, cigarettes, uncomplicated: Secondary | ICD-10-CM | POA: Diagnosis not present

## 2019-10-28 HISTORY — DX: Unspecified osteoarthritis, unspecified site: M19.90

## 2019-10-28 LAB — URINALYSIS, ROUTINE W REFLEX MICROSCOPIC
Bilirubin Urine: NEGATIVE
Glucose, UA: NEGATIVE mg/dL
Hgb urine dipstick: NEGATIVE
Ketones, ur: NEGATIVE mg/dL
Leukocytes,Ua: NEGATIVE
Nitrite: NEGATIVE
Protein, ur: 30 mg/dL — AB
Specific Gravity, Urine: 1.035 — ABNORMAL HIGH (ref 1.005–1.030)
pH: 5 (ref 5.0–8.0)

## 2019-10-28 MED ORDER — PREDNISONE 10 MG PO TABS: 40.0000 mg | ORAL_TABLET | Freq: Every day | ORAL | 0 refills | Status: DC

## 2019-10-28 NOTE — ED Triage Notes (Signed)
Pt c/o pain in center of lower back radiating around to left side x 5 days.  Reports went to [facility] and had an xray and urine sample but left without being seen.  Says they told her it was normal.  Pt says she goes to pain management and says her pain medication isn't helping her back.  Pt says she took 800mg  ibuprofen this morning and flexeril last night without relief.

## 2019-10-28 NOTE — Discharge Instructions (Signed)
X-ray of the back without any acute findings.  Take the prednisone as directed to help the inflammation in the back.  He can continue Flexeril.  And your pain medication.  Make an appointment to follow-up with your doctor if back pain not improved in a week.

## 2019-10-28 NOTE — ED Provider Notes (Signed)
[facility] Provider Note   CSN: [id number] Arrival date & time: 10/28/19  7510     History Chief Complaint  Patient presents with  . Back Pain    Robin Bender is a 43 y.o. female.  Patient with complaint of left-sided back pain for about 5 days.  No fall or injury.  Patient is on chronic pain medication already.  Has been taking Flexeril and Motrin without any help.  Some radiation of pain towards the left hip area no radiation into the back of the leg or into the foot.  No persistent numbness or weakness to the foot.  Patient states that sometimes when she is standing for long period of time both her feet get numb.  But nothing on a consistent basis.  Patient points to the lateral aspect of the left lumbar back.        Past Medical History:  Diagnosis Date  . Anemia   . Anxiety   . Arthritis   . Bipolar disorder ([place])   . Depression   . Fatigue   . Fibromyalgia   . Headache   . Hypertension   . PTSD (post-traumatic stress disorder)     Patient Active Problem List   Diagnosis Date Noted  . PTSD (post-traumatic stress disorder) 01/10/2017  . Major depressive disorder, recurrent episode, moderate ([place]) 01/10/2017  . Generalized anxiety disorder 01/10/2017  . Obesity, morbid, BMI 40.0-49.9 ([place]) 11/19/2016  . Degenerative lumbar disc 11/19/2016  . 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 Condyloma acuminatum of vulva 10/07/2016  . Obstructive sleep apnea syndrome 04/04/2015  . Arthritis of both knees 06/02/2014  . Fibromyalgia 01/23/2014  . Chronic bilateral low back pain without sciatica 01/23/2014  . Breast pain 10/17/2012  . 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 ABDOMINAL HYSTERECTOMY    . APPENDECTOMY    . CARPAL TUNNEL RELEASE    . CESAREAN SECTION  2002, 2004  . LAPAROSCOPIC APPENDECTOMY N/A 05/19/2014   Procedure: APPENDECTOMY LAPAROSCOPIC;  Surgeon: Jamesetta So, MD;   Location: [facility];  Service: General;  Laterality: N/A;  . ovarian cyst removed       OB History    Gravida  2   Para  2   Term  2   Preterm      AB      Living  2     SAB      TAB      Ectopic      Multiple      Live Births              Family History  Problem Relation Age of Onset  . Cancer Mother        cervical  . Hypertension Mother   . Heart disease Mother   . Arthritis/Rheumatoid Mother     Social History   Tobacco Use  . Smoking status: Current Every Day Smoker    Packs/day: 0.00    Years: 22.00    Pack years: 0.00    Types: Cigarettes  . Smokeless tobacco: Never Used  . Tobacco comment: reduce the # of cigarettes   Substance Use Topics  . Alcohol use: No  . Drug use: No    Home Medications Prior to Admission medications   Medication Sig Start Date End Date Taking? Authorizing Provider  ALPRAZolam Duanne Moron) 0.5 MG tablet Take 0.5 mg by mouth daily as needed for anxiety or sleep.  11/19/16  [provider]  buPROPion (WELLBUTRIN XL) 150 MG 24 hr tablet TAKE 1 TABLET BY MOUTH EVERY MORNING 12/15/16   [provider]  estradiol (ESTRACE) 1 MG tablet TAKE 1 TABLET BY MOUTH EVERY DAY 04/14/18   Tilda BurrowFerguson, John V, MD  hydrochlorothiazide (MICROZIDE) 12.5 MG capsule Take 1 capsule by mouth daily. 02/21/17 02/21/18  [provider]  ibuprofen (ADVIL,MOTRIN) 200 MG tablet Take 600 mg by mouth every 6 (six) hours as needed for fever, mild pain, moderate pain or cramping.    [provider]  methocarbamol (ROBAXIN) 500 MG tablet Take 1 tablet (500 mg total) by mouth 3 (three) times daily. 03/31/17   Triplett, Tammy, PA-C  predniSONE (DELTASONE) 10 MG tablet Take 4 tablets (40 mg total) by mouth daily. 10/28/19   Vanetta MuldersZackowski, Ceniyah Thorp, MD  traMADol (ULTRAM) 50 MG tablet Take 1 tablet (50 mg total) by mouth every 6 (six) hours as needed. 03/31/17   Triplett, Tammy, PA-C  vitamin B-12 (CYANOCOBALAMIN) 100 MCG tablet Take 100 mcg by  mouth daily.    [provider]    Allergies    Ampicillin-sulbactam sodium, Codeine, and Demerol  Review of Systems   Review of Systems  Constitutional: Negative for chills and fever.  HENT: Negative for congestion, rhinorrhea and sore throat.   Eyes: Negative for visual disturbance.  Respiratory: Negative for cough and shortness of breath.   Cardiovascular: Negative for chest pain and leg swelling.  Gastrointestinal: Negative for abdominal pain, diarrhea, nausea and vomiting.  Genitourinary: Negative for difficulty urinating and dysuria.  Musculoskeletal: Positive for back pain. Negative for neck pain.  Skin: Negative for rash.  Neurological: Positive for numbness. Negative for dizziness, weakness, light-headedness and headaches.  Hematological: Does not bruise/bleed easily.  Psychiatric/Behavioral: Negative for confusion.    Physical Exam Updated Vital Signs BP 120/81 (BP Location: Right Arm)   Pulse (!) 102   Temp 98.2 F (36.8 C) (Oral)   Resp 18   Ht 1.6 m (5\' 3" )   Wt 123.8 kg   SpO2 97%   BMI 48.36 kg/m   Physical Exam Vitals and nursing note reviewed.  Constitutional:      General: She is not in acute distress.    Appearance: Normal appearance. She is well-developed.  HENT:     Head: Normocephalic and atraumatic.  Eyes:     Extraocular Movements: Extraocular movements intact.     Conjunctiva/sclera: Conjunctivae normal.     Pupils: Pupils are equal, round, and reactive to light.  Cardiovascular:     Rate and Rhythm: Normal rate and regular rhythm.     Heart sounds: No murmur.  Pulmonary:     Effort: Pulmonary effort is normal. No respiratory distress.     Breath sounds: Normal breath sounds.  Abdominal:     Palpations: Abdomen is soft.     Tenderness: There is no abdominal tenderness.  Musculoskeletal:        General: No swelling. Normal range of motion.     Cervical back: Normal range of motion and neck supple.     Comments: No tenderness  to palpation to the lower back.  No spine tenderness to palpation.  Skin:    General: Skin is warm and dry.     Capillary Refill: Capillary refill takes less than 2 seconds.  Neurological:     General: No focal deficit present.     Mental Status: She is alert and oriented to person, place, and time.     Cranial Nerves:  No cranial nerve deficit.     Sensory: No sensory deficit.     Motor: No weakness.     ED Results / Procedures / Treatments   Labs (all labs ordered are listed, but only abnormal results are displayed) Labs Reviewed  URINALYSIS, ROUTINE W REFLEX MICROSCOPIC - Abnormal; Notable for the following components:      Result Value   Color, Urine AMBER (*)    APPearance HAZY (*)    Specific Gravity, Urine 1.035 (*)    Protein, ur 30 (*)    Bacteria, UA FEW (*)    All other components within normal limits    EKG None  Radiology DG Lumbar Spine Complete  Result Date: 10/28/2019 CLINICAL DATA:  Left lumbar back pain for 1 week EXAM: LUMBAR SPINE - COMPLETE 4+ VIEW COMPARISON:  None. FINDINGS: Lower thoracic spondylosis and L5-S1 disc narrowing. No evidence of fracture or erosion. Negative soft tissues. IMPRESSION: 1. No acute finding. 2. Mild L5-S1 disc narrowing. Electronically Signed   By: Marnee Spring M.D.   On: 10/28/2019 08:15    Procedures Procedures (including critical care time)  Medications Ordered in ED Medications - No data to display  ED Course  I have reviewed the triage vital signs and the nursing notes.  Pertinent labs & imaging results that were available during my care of the patient were reviewed by me and considered in my medical decision making (see chart for details).    MDM Rules/Calculators/A&P                     X-ray of the lumbar back without any acute findings.  Will treat symptomatically.  We will add prednisone to her regimen.  Have her stop the Motrin.  Continue her pain medicine and Flexeril and follow-up with her primary care  doctor.  Physical exam with localized pain to the left lateral aspect of the lumbar back.  No evidence of sciatica at this time.    Final Clinical Impression(s) / ED Diagnoses Final diagnoses:  Acute left-sided low back pain without sciatica    Rx / DC Orders ED Discharge Orders         Ordered    predniSONE (DELTASONE) 10 MG tablet  Daily     10/28/19 0830           Vanetta Mulders, MD 10/28/19 [phone]

## 2020-01-12 ENCOUNTER — Ambulatory Visit
Admission: RE | Admit: 2020-01-12 | Discharge: 2020-01-12 | Disposition: A | Discharge status: Home / Self Care | Payer: Medicaid Other | Source: Ambulatory Visit | Attending: Physical Medicine and Rehabilitation | Admitting: Physical Medicine and Rehabilitation

## 2020-01-12 ENCOUNTER — Other Ambulatory Visit: Payer: Self-pay

## 2020-01-12 DIAGNOSIS — M5126 Other intervertebral disc displacement, lumbar region: Secondary | ICD-10-CM

## 2020-03-26 ENCOUNTER — Ambulatory Visit (INDEPENDENT_AMBULATORY_CARE_PROVIDER_SITE_OTHER): Payer: Medicaid Other | Admitting: Cardiovascular Disease

## 2020-03-26 ENCOUNTER — Encounter: Payer: Self-pay | Admitting: Cardiovascular Disease

## 2020-03-26 ENCOUNTER — Other Ambulatory Visit: Payer: Self-pay

## 2020-03-26 DIAGNOSIS — E782 Mixed hyperlipidemia: Secondary | ICD-10-CM | POA: Diagnosis not present

## 2020-03-26 DIAGNOSIS — Z720 Tobacco use: Secondary | ICD-10-CM | POA: Diagnosis not present

## 2020-03-26 DIAGNOSIS — E785 Hyperlipidemia, unspecified: Secondary | ICD-10-CM | POA: Insufficient documentation

## 2020-03-26 DIAGNOSIS — Z8249 Family history of ischemic heart disease and other diseases of the circulatory system: Secondary | ICD-10-CM

## 2020-03-26 NOTE — Progress Notes (Signed)
03/26/2020 [name]   1976/05/27  [id number]  Primary Physician Royann Shivers, PA-C Primary Cardiologist: Runell Gess MD [name], [place]  HPI:  Robin Bender is a 44 y.o. severely overweight (BMI 78) divorced Caucasian female mother of 2 children and 4 stepchildren, grandmother of 1 grandchild referred by Laurel Dimmer, PA-C for cardiovascular valuation because of risk factors and family history.  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  Factors include currently smoking 1 pack of cigarettes per week down from a pack every other day.  She has been smoking since age 62.  She has untreated hyperlipidemia as well as family history of heart disease the father had a myocardial infarction and stents at the age of 14.  She is never had a heart attack or stroke.  She denies chest pain or shortness of breath.   Current Meds  Medication Sig  . HYDROcodone-acetaminophen (NORCO/VICODIN) 5-325 MG tablet Take by mouth.  [name] ibuprofen (ADVIL,MOTRIN) 200 MG tablet Take 600 mg by mouth every 6 (six) hours as needed for fever, mild pain, moderate pain or cramping.  . linaclotide (LINZESS) 72 MCG capsule Take 72 mcg by mouth.      Allergies  Allergen Reactions  . Ampicillin-Sulbactam Sodium     Rash, redness noted around iv site,   . Codeine Other (See Comments)    Blisters around mouth  . Demerol Hives    Social History   Socioeconomic History  . Marital status: Divorced    Spouse name: Not on file  . Number of children: Not on file  . Years of education: Not on file  . Highest education level: Not on file  Occupational History  . Not on file  Tobacco Use  . Smoking status: Current Every Day Smoker    Packs/day: 0.00    Years: 22.00    Pack years: 0.00    Types: Cigarettes  . Smokeless tobacco: Never Used  . Tobacco comment: reduce the # of cigarettes   Substance and Sexual Activity  . Alcohol  use: No  . Drug use: No  . Sexual activity: Yes    Partners: Male    Birth control/protection: Surgical  Other Topics Concern  . Not on file  Social History Narrative  . Not on file   Social Determinants of Health   Financial Resource Strain:   . Difficulty of Paying Living Expenses:   Food Insecurity:   . Worried About Programme researcher, broadcasting/film/video in the Last Year:   . Barista in the Last Year:   Transportation Needs:   . Freight forwarder (Medical):   [name] Lack of Transportation (Non-Medical):   Physical Activity:   . Days of Exercise per Week:   . Minutes of Exercise per Session:   Stress:   . Feeling of Stress :   Social Connections:   . Frequency of Communication with Friends and Family:   . Frequency of Social Gatherings with Friends and Family:   . Attends Religious Services:   . Active Member of Clubs or Organizations:   . Attends Banker Meetings:   [name] Marital Status:   Intimate Partner Violence:   . Fear of Current or Ex-Partner:   . Emotionally Abused:   [name] Physically Abused:   . Sexually Abused:      Review of Systems: General: negative for chills, fever, night  sweats or weight changes.  Cardiovascular: negative for chest pain, dyspnea on exertion, edema, orthopnea, palpitations, paroxysmal nocturnal dyspnea or shortness of breath Dermatological: negative for rash Respiratory: negative for cough or wheezing Urologic: negative for hematuria Abdominal: negative for nausea, vomiting, diarrhea, bright red blood per rectum, melena, or hematemesis Neurologic: negative for visual changes, syncope, or dizziness All other systems reviewed and are otherwise negative except as noted above.    Blood pressure 122/66, pulse 99, height 5\' 4"  (1.626 m), weight 286 lb 3.2 oz (129.8 kg), SpO2 96 %.  General appearance: alert and no distress Neck: no adenopathy, no carotid bruit, no JVD, supple, symmetrical, trachea midline and thyroid not enlarged,  symmetric, no tenderness/mass/nodules Lungs: clear to auscultation bilaterally Heart: regular rate and rhythm, S1, S2 normal, no murmur, click, rub or gallop Extremities: extremities normal, atraumatic, no cyanosis or edema Pulses: 2+ and symmetric Skin: Skin color, texture, turgor normal. No rashes or lesions Neurologic: Alert and oriented X 3, normal strength and tone. Normal symmetric reflexes. Normal coordination and gait  EKG sinus rhythm at 99 with poor R wave progression.  I personally reviewed this EKG.  ASSESSMENT AND PLAN:   Hyperlipidemia History of hyperlipidemia with lipid profile performed 01/31/2018 revealing total cholesterol 245, LDL of 155 and HDL 52.  I have not repeat a lipid liver profile.  Based on that I will start her on a statin drug.  I am also going to get a coronary calcium score to risk stratify her and did to determine degree of treatment for primary prevention  Family history of heart disease Father had myocardial infarction and stents at age 26.  Tobacco abuse Tobacco abuse ongoing tobacco abuse of a pack per week down from a pack every second or third day, smoking since age 22, counseled about the importance of smoking cessation.      Lorretta Harp MD FACP,FACC,FAHA, [facility] 03/26/2020 10:18 AM

## 2020-03-26 NOTE — Assessment & Plan Note (Signed)
Tobacco abuse ongoing tobacco abuse of a pack per week down from a pack every second or third day, smoking since age 44, counseled about the importance of smoking cessation.

## 2020-03-26 NOTE — Assessment & Plan Note (Signed)
Father had myocardial infarction and stents at age 43.

## 2020-03-26 NOTE — Assessment & Plan Note (Signed)
History of hyperlipidemia with lipid profile performed 01/31/2018 revealing total cholesterol 245, LDL of 155 and HDL 52.  I have not repeat a lipid liver profile.  Based on that I will start her on a statin drug.  I am also going to get a coronary calcium score to risk stratify her and did to determine degree of treatment for primary prevention

## 2020-03-26 NOTE — Patient Instructions (Signed)
Medication Instructions:  The current medical regimen is effective;  continue present plan and medications.  *If you need a refill on your cardiac medications before your next appointment, please call your pharmacy*   Lab Work: LIPID/LIVER (come back fasting, nothing to eat or drink, no appointment at our lab needed) If you have labs (blood work) drawn today and your tests are completely normal, you will receive your results only by: [name] MyChart Message (if you have MyChart) OR . A paper copy in the mail If you have any lab test that is abnormal or we need to change your treatment, we will call you to review the results.   Testing/Procedures: CALCIUM SCORE   Follow-Up: At [facility], you and your health needs are our priority.  As part of our continuing mission to provide you with exceptional heart care, we have created designated Provider Care Teams.  These Care Teams include your primary Cardiologist (physician) and Advanced Practice Providers (APPs -  Physician Assistants and Nurse Practitioners) who all work together to provide you with the care you need, when you need it.  We recommend signing up for the patient portal called "MyChart".  Sign up information is provided on this After Visit Summary.  MyChart is used to connect with patients for Virtual Visits (Telemedicine).  Patients are able to view lab/test results, encounter notes, upcoming appointments, etc.  Non-urgent messages can be sent to your provider as well.   To learn more about what you can do with MyChart, go to [email or web address].    Your next appointment:   6 month(s)  The format for your next appointment:   In Person  Provider:   Nanetta Batty, MD

## 2020-03-29 LAB — HEPATIC FUNCTION PANEL
ALT: 24 IU/L (ref 0–32)
AST: 25 IU/L (ref 0–40)
Albumin: 4.2 g/dL (ref 3.8–4.8)
Alkaline Phosphatase: 82 IU/L (ref 48–121)
Bilirubin Total: 0.5 mg/dL (ref 0.0–1.2)
Bilirubin, Direct: 0.12 mg/dL (ref 0.00–0.40)
Total Protein: 6.3 g/dL (ref 6.0–8.5)

## 2020-03-29 LAB — LIPID PANEL
Chol/HDL Ratio: 5.1 ratio — ABNORMAL HIGH (ref 0.0–4.4)
Cholesterol, Total: 223 mg/dL — ABNORMAL HIGH (ref 100–199)
HDL: 44 mg/dL (ref >39)
LDL Chol Calc (NIH): 130 mg/dL — ABNORMAL HIGH (ref 0–99)
Triglycerides: 274 mg/dL — ABNORMAL HIGH (ref 0–149)
VLDL Cholesterol Cal: 49 mg/dL — ABNORMAL HIGH (ref 5–40)

## 2020-03-31 ENCOUNTER — Telehealth: Payer: Self-pay

## 2020-03-31 DIAGNOSIS — E782 Mixed hyperlipidemia: Secondary | ICD-10-CM

## 2020-03-31 MED ORDER — ATORVASTATIN CALCIUM 20 MG PO TABS: 20.0000 mg | ORAL_TABLET | Freq: Every day | ORAL | 3 refills | Status: DC

## 2020-03-31 NOTE — Telephone Encounter (Signed)
Spoke to patient lab results given.Dr.Berry advised to start Atorvastatin 20 mg daily.Repeat fasting lipid panel in 2 months.Lab order mailed.

## 2020-04-01 ENCOUNTER — Other Ambulatory Visit: Payer: Self-pay

## 2020-04-01 DIAGNOSIS — E782 Mixed hyperlipidemia: Secondary | ICD-10-CM

## 2020-04-04 ENCOUNTER — Telehealth: Payer: Self-pay | Admitting: Cardiovascular Disease

## 2020-04-04 ENCOUNTER — Ambulatory Visit (INDEPENDENT_AMBULATORY_CARE_PROVIDER_SITE_OTHER)
Admission: RE | Admit: 2020-04-04 | Discharge: 2020-04-04 | Disposition: A | Discharge status: Home / Self Care | Payer: Self-pay | Source: Ambulatory Visit | Attending: Cardiovascular Disease | Admitting: Cardiovascular Disease

## 2020-04-04 ENCOUNTER — Other Ambulatory Visit: Payer: Self-pay

## 2020-04-04 DIAGNOSIS — Z8249 Family history of ischemic heart disease and other diseases of the circulatory system: Secondary | ICD-10-CM

## 2020-04-04 DIAGNOSIS — E782 Mixed hyperlipidemia: Secondary | ICD-10-CM

## 2020-04-04 NOTE — Telephone Encounter (Signed)
Patient returned call to Robin Bender to discuss CT results. Call successfully transferred to [facility]

## 2020-05-07 LAB — LIPID PANEL
Chol/HDL Ratio: 4.2 ratio (ref 0.0–4.4)
Cholesterol, Total: 179 mg/dL (ref 100–199)
HDL: 43 mg/dL (ref >39)
LDL Chol Calc (NIH): 99 mg/dL (ref 0–99)
Triglycerides: 218 mg/dL — ABNORMAL HIGH (ref 0–149)
VLDL Cholesterol Cal: 37 mg/dL (ref 5–40)

## 2020-07-03 HISTORY — PX: DE QUERVAIN'S RELEASE: SHX1439

## 2020-07-15 NOTE — Progress Notes (Signed)
PATIENT ID: Robin Bender, female     DOB: May 20, 1976, 44 y.o.     MRN: [id number]   [facility] Visit  07/15/20     PATIENT NAME: Robin Bender     MRN [id number]     DOB: [birth year]  CC & HPI:  No chief complaint on file.  Robin Bender is a 44 y.o. female presenting today for labial discomfort, hx condyloma, thinks one is on left labia minora which is elongate, and catches on clothing, or with sex.  ROS:  Review of Systems Endometriosis had tahbso 2009  Pertinent History Reviewed:  Medical         Past Medical History:  Diagnosis Date  . Anemia   . Anxiety   . Arthritis   . Bipolar disorder (HCC)   . Depression   . Fatigue   . Fibromyalgia   . Headache   . Hypertension   . PTSD (post-traumatic stress disorder)                               Surgical Hx:    Past Surgical History:  Procedure Laterality Date  . ABDOMINAL HYSTERECTOMY    . APPENDECTOMY    . CARPAL TUNNEL RELEASE    . CESAREAN SECTION  2002, 2004  . LAPAROSCOPIC APPENDECTOMY N/A 05/19/2014   Procedure: APPENDECTOMY LAPAROSCOPIC;  Surgeon: Robin Heading, Robin Bender;  Location: [facility];  Service: General;  Laterality: N/A;  . ovarian cyst removed     Medications: Reviewed & Updated - see associated section                       Current Outpatient Medications:  .  atorvastatin (LIPITOR) 20 MG tablet, Take 1 tablet (20 mg total) by mouth daily., Disp: 90 tablet, Rfl: 3 .  estradiol (ESTRACE) 1 MG tablet, TAKE 1 TABLET BY MOUTH EVERY DAY (Patient not taking: Reported on 03/26/2020), Disp: 90 tablet, Rfl: 4 .  HYDROcodone-acetaminophen (NORCO/VICODIN) 5-325 MG tablet, Take by mouth., Disp: , Rfl:  .  ibuprofen (ADVIL,MOTRIN) 200 MG tablet, Take 600 mg by mouth every 6 (six) hours as needed for fever, mild pain, moderate pain or cramping., Disp: , Rfl:  .  linaclotide (LINZESS) 72 MCG capsule, Take 72 mcg by mouth. , Disp: , Rfl:    Social History: Reviewed -  reports that she has been smoking cigarettes.  She has been smoking about 0.00 packs per day for the past 22.00 years. She has never used smokeless tobacco. Sister in law of Robin Bender, mutual acquaintance. Objective Findings:  Vitals: There were no vitals taken for this visit. BP 113/72 (BP Location: Right Arm, Patient Position: Sitting, Cuff Size: Large)   Pulse 92   Ht 5\' 4"  (1.626 m)   Wt 289 lb 6.4 oz (131.3 kg)   BMI 49.68 kg/m   PHYSICAL EXAMINATION General appearance - alert, well appearing, and in no distress, oriented to person, place, and time and overweight Mental status - alert, oriented to person, place, and time Chest -  Heart - normal rate and regular rhythm Abdomen - soft, nontender, nondistended, no masses or organomegaly Marked obesity Breasts -  Skin - normal coloration and turgor, no rashes, no suspicious skin lesions noted See pelvic  PELVIC External genitalia - elongate asymmetric left labia minora, 4 cm length asymmetry with a single small condyloma on outer edge of labia Vulva -  normal right labia, normal female efg otherwise Vagina - good support Cervix -  surgically absent} Uterus - surgically absent} Adnexa - no masses Wet Mount - n/a Rectal - rectal exam not indicated  Assessment & Plan:   A:  1.  labia minora hypertrophy, assymmetric 2. vulvar condyloma 3. Obesity Body mass index is 49.68 kg/m. 4.   P:  1.  pt has surgery on wrist this month, so would need to schedule removal of labia for later. 2. Return PRN whten reasdy to proceed with surgery 3. Weight loss encouraged. 4.    By signing my name below, I, Robin Bender, attest that this documentation has been prepared under the direction and in the presence of Robin Burrow, Robin Bender. Electronically Signed: Mal Bender Medical Scribe. 07/15/20. 10:50 PM.  I personally performed the services described in this documentation, which was SCRIBED in my presence. The recorded information has been reviewed and considered accurate. It has  been edited as necessary during review. Robin Burrow, Robin Bender

## 2020-07-16 ENCOUNTER — Encounter: Payer: Self-pay | Admitting: Obstetrics and Gynecology

## 2020-07-16 ENCOUNTER — Ambulatory Visit (INDEPENDENT_AMBULATORY_CARE_PROVIDER_SITE_OTHER): Payer: Medicaid Other | Admitting: Obstetrics and Gynecology

## 2020-07-16 VITALS — BP 113/72 | HR 92 | Ht 64.0 in | Wt 289.4 lb

## 2020-07-16 DIAGNOSIS — A630 Anogenital (venereal) warts: Secondary | ICD-10-CM | POA: Diagnosis not present

## 2020-07-16 DIAGNOSIS — N906 Unspecified hypertrophy of vulva: Secondary | ICD-10-CM | POA: Insufficient documentation

## 2020-07-27 ENCOUNTER — Encounter (HOSPITAL_COMMUNITY): Payer: Self-pay | Admitting: *Deleted

## 2020-07-27 ENCOUNTER — Other Ambulatory Visit: Payer: Self-pay

## 2020-07-27 ENCOUNTER — Emergency Department (HOSPITAL_COMMUNITY)
Admission: EM | Admit: 2020-07-27 | Discharge: 2020-07-27 | Disposition: A | Discharge status: Home / Self Care | Payer: Medicaid Other | Attending: Emergency Medicine | Admitting: Emergency Medicine

## 2020-07-27 DIAGNOSIS — Z5321 Procedure and treatment not carried out due to patient leaving prior to being seen by health care provider: Secondary | ICD-10-CM | POA: Diagnosis not present

## 2020-07-27 DIAGNOSIS — R0602 Shortness of breath: Secondary | ICD-10-CM | POA: Diagnosis present

## 2020-07-27 DIAGNOSIS — R519 Headache, unspecified: Secondary | ICD-10-CM | POA: Diagnosis not present

## 2020-07-27 DIAGNOSIS — U071 COVID-19: Secondary | ICD-10-CM | POA: Insufficient documentation

## 2020-07-27 MED ORDER — ACETAMINOPHEN 500 MG PO TABS
1000.0000 mg | ORAL_TABLET | Freq: Once | ORAL | Status: AC
  Administered 2020-07-27: 1000 mg via ORAL
  Filled 2020-07-27: qty 2

## 2020-07-27 NOTE — ED Triage Notes (Signed)
dx'd with Covid on the 9/16 at her PCP.  Pt with sob, thirsty, pt states she has passed out at home.  Pt states her kidneys hurt and HA. Pt did not get vaccinated for Covid.

## 2020-07-28 DIAGNOSIS — J1282 Pneumonia due to coronavirus disease 2019: Secondary | ICD-10-CM | POA: Insufficient documentation

## 2020-07-30 DIAGNOSIS — J1282 Pneumonia due to coronavirus disease 2019: Secondary | ICD-10-CM

## 2020-07-30 DIAGNOSIS — U071 COVID-19: Secondary | ICD-10-CM

## 2020-07-30 HISTORY — DX: Pneumonia due to coronavirus disease 2019: J12.82

## 2020-07-30 HISTORY — DX: COVID-19: U07.1

## 2020-08-05 ENCOUNTER — Ambulatory Visit: Payer: Medicaid Other | Admitting: Obstetrics & Gynecology

## 2020-08-21 ENCOUNTER — Ambulatory Visit (INDEPENDENT_AMBULATORY_CARE_PROVIDER_SITE_OTHER): Payer: Medicaid Other | Admitting: Orthopaedic Surgery

## 2020-08-21 ENCOUNTER — Encounter: Payer: Self-pay | Admitting: Orthopaedic Surgery

## 2020-08-21 DIAGNOSIS — M654 Radial styloid tenosynovitis [de Quervain]: Secondary | ICD-10-CM | POA: Diagnosis not present

## 2020-08-21 NOTE — Progress Notes (Signed)
Office Visit Note °             °Patient: Robin Bender                                  °Date of Birth: 12/26/1975                                                    °MRN: [id number] °Visit Date: 08/21/2020 °                                                                    °Requested by: Skillman, Katherine E, PA-C °[address] °[place],  [place] [id number] °PCP: Skillman, Katherine E, PA-C °  °  °Assessment & Plan: °Visit Diagnoses:  °1. Tendinitis, de Quervain's   °  °  °Plan: Patient has been through splinting she was approved for surgery by Dr. Case she relates.  She like proceed with outpatient surgery wrist first dorsal compartment release of her right wrist.  We discussed postoperative splint followed by removable splint.  Questions were elicited and answered. °  °Follow-Up Instructions: No follow-ups on file.  °  °Orders:  °No orders of the defined types were placed in this encounter. °  °No orders of the defined types were placed in this encounter. °  °  °  ° Procedures: °No procedures performed °  °  °Clinical Data: °No additional findings. °  °  °Subjective: °   °Chief Complaint  °Patient presents with  °• Right Wrist - Pain  °  °  °HPI 44-year-old female has seen Dr. Case in [place] and was supposed to get D curve Anes release right wrist and she states it is office is been close to her his contract was not renewed.  She has had previous carpal tunnel release by him 2005 which did well and also release of the left wrist.  She is right-hand dominant.  MRI scan has been done of the wrist which showed tenosynovitis and possible split the tendon which more likely is anatomic variant but she does have first dorsal compartment tenosynovitis by MRI scan done at [facility] [place] [facility].  Patient is requesting that she be scheduled for surgery in [place] as an outpatient. °  °Review of Systems she has children and stepchildren totaling 6.  Previous C-section wisdom of the surgery appendectomy hysterectomy all without  problems.  Patient does not smoke or drink.  She has had past problems with depression pneumonia acid reflux bronchitis.  Positive for morbid obesity.  All other systems are negative. °  °  °Objective: °Vital Signs: Ht 5' 4" (1.626 m)    Wt 280 lb (127 kg)    BMI 48.06 kg/m²  °  °Physical Exam °Constitutional:   °   Appearance: She is well-developed.  °HENT:  °   Head: Normocephalic.  °   Right Ear: External ear normal.  °   Left Ear: External ear normal.  °Eyes:  °   Pupils: Pupils are equal, round, and reactive to light.  °Neck:  °   Thyroid: No thyromegaly.  °   Trachea: No tracheal deviation.  °  Cardiovascular:  °   Rate and Rhythm: Normal rate.  °Pulmonary:  °   Effort: Pulmonary effort is normal.  °Abdominal:  °   Palpations: Abdomen is soft.  °Skin: °   General: Skin is warm and dry.  °Neurological:  °   Mental Status: She is alert and oriented to person, place, and time.  °Psychiatric:     °   Behavior: Behavior normal.  °  °  °  °Ortho Exam well-healed left first dorsal compartment incision.  Positive Finkelstein right with extreme pain with palpation of the first dorsal compartment.  Healed carpal tunnel incision negative Phalen's test.  Good cervical range of motion full elbow extension flexion.  Positive Finkelstein test on the right negative on the left. °  °Specialty Comments:  °No specialty comments available. °  °Imaging: °No results found. °  °  °PMFS History: °    °Patient Active Problem List  °  Diagnosis Date Noted  °• Tendinitis, de Quervain's 08/21/2020  °• Labia minora hypertrophy 07/16/2020  °• Hyperlipidemia 03/26/2020  °• Family history of heart disease 03/26/2020  °• Tobacco abuse 03/26/2020  °• PTSD (post-traumatic stress disorder) 01/10/2017  °• Major depressive disorder, recurrent episode, moderate (HCC) 01/10/2017  °• Generalized anxiety disorder 01/10/2017  °• Obesity, morbid, BMI 40.0-49.9 (HCC) 11/19/2016  °• Degenerative lumbar disc 11/19/2016  °• 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   °• Condyloma acuminatum of vulva 10/07/2016  °• Obstructive sleep apnea syndrome 04/04/2015  °• Arthritis of both knees 06/02/2014  °• Fibromyalgia 01/23/2014  °• Chronic bilateral low back pain without sciatica 01/23/2014  °• Breast pain 10/17/2012  °• PATELLO-FEMORAL SYNDROME 03/03/2009  °  °Past Medical History:  °Diagnosis Date  °• Anemia    °• Anxiety    °• Arthritis    °• Bipolar disorder (HCC)    °• Depression    °• Fatigue    °• Fibromyalgia    °• Headache    °• Hypertension    °• PTSD (post-traumatic stress disorder)    °  °     °Family History  °Problem Relation Age of Onset  °• Cancer Mother    °      cervical  °• Hypertension Mother    °• Heart disease Mother    °• Arthritis/Rheumatoid Mother    °  °     °Past Surgical History:  °Procedure Laterality Date  °• ABDOMINAL HYSTERECTOMY      °• APPENDECTOMY      °• CARPAL TUNNEL RELEASE      °• CESAREAN SECTION   2002, 2004  °• DE QUERVAIN'S RELEASE Left 07/03/2020  °• LAPAROSCOPIC APPENDECTOMY N/A 05/19/2014  °  Procedure: APPENDECTOMY LAPAROSCOPIC;  Surgeon: Staley Lunz A Jenkins, MD;  Location: [facility];  Service: General;  Laterality: N/A;  °• ovarian cyst removed      °  °Social History  °  °     °Occupational History  °• Not on file  °Tobacco Use  °• Smoking status: Current Every Day Smoker  °    Packs/day: 0.00  °    Years: 22.00  °    Pack years: 0.00  °    Types: Cigarettes  °• Smokeless tobacco: Never Used  °• Tobacco comment: reduce the # of cigarettes   °Vaping Use  °• Vaping Use: Never used  °Substance and Sexual Activity  °• Alcohol use: No  °• Drug use: No  °• Sexual activity: Yes  °    Partners: Male  °    Birth control/protection: Surgical  °    Comment: hyst  °  °  °  ° °

## 2020-08-25 ENCOUNTER — Other Ambulatory Visit: Payer: Self-pay

## 2020-08-25 ENCOUNTER — Encounter (HOSPITAL_BASED_OUTPATIENT_CLINIC_OR_DEPARTMENT_OTHER): Payer: Self-pay | Admitting: Orthopaedic Surgery

## 2020-08-28 ENCOUNTER — Other Ambulatory Visit (HOSPITAL_COMMUNITY): Payer: Medicaid Other

## 2020-08-28 NOTE — Progress Notes (Signed)
Anesthesia consult per Dr.Germeroth, will proceed with surgery as scheduled.  

## 2020-09-01 ENCOUNTER — Ambulatory Visit (HOSPITAL_BASED_OUTPATIENT_CLINIC_OR_DEPARTMENT_OTHER): Payer: Medicaid Other | Admitting: Certified Registered"

## 2020-09-01 ENCOUNTER — Encounter (HOSPITAL_BASED_OUTPATIENT_CLINIC_OR_DEPARTMENT_OTHER): Payer: Self-pay | Admitting: Orthopaedic Surgery

## 2020-09-01 ENCOUNTER — Other Ambulatory Visit: Payer: Self-pay

## 2020-09-01 ENCOUNTER — Encounter (HOSPITAL_BASED_OUTPATIENT_CLINIC_OR_DEPARTMENT_OTHER)
Admission: RE | Disposition: A | Discharge status: Home / Self Care | Payer: Self-pay | Source: Home / Self Care | Attending: Orthopaedic Surgery

## 2020-09-01 ENCOUNTER — Ambulatory Visit (HOSPITAL_BASED_OUTPATIENT_CLINIC_OR_DEPARTMENT_OTHER)
Admission: RE | Admit: 2020-09-01 | Discharge: 2020-09-01 | Disposition: A | Discharge status: Home / Self Care | Payer: Medicaid Other | Attending: Orthopaedic Surgery | Admitting: Orthopaedic Surgery

## 2020-09-01 DIAGNOSIS — Z6841 Body Mass Index (BMI) 40.0 and over, adult: Secondary | ICD-10-CM | POA: Insufficient documentation

## 2020-09-01 DIAGNOSIS — G4730 Sleep apnea, unspecified: Secondary | ICD-10-CM | POA: Insufficient documentation

## 2020-09-01 DIAGNOSIS — M65831 Other synovitis and tenosynovitis, right forearm: Secondary | ICD-10-CM

## 2020-09-01 DIAGNOSIS — M654 Radial styloid tenosynovitis [de Quervain]: Secondary | ICD-10-CM | POA: Diagnosis not present

## 2020-09-01 DIAGNOSIS — M65839 Other synovitis and tenosynovitis, unspecified forearm: Secondary | ICD-10-CM

## 2020-09-01 HISTORY — PX: DORSAL COMPARTMENT RELEASE: SHX5039

## 2020-09-01 SURGERY — RELEASE, FIRST DORSAL COMPARTMENT, HAND
Anesthesia: General | Site: Wrist | Laterality: Right

## 2020-09-01 MED ORDER — ONDANSETRON HCL 4 MG/2ML IJ SOLN
INTRAMUSCULAR | Status: DC | PRN
  Administered 2020-09-01: 4 mg via INTRAVENOUS

## 2020-09-01 MED ORDER — DEXAMETHASONE SODIUM PHOSPHATE 10 MG/ML IJ SOLN
INTRAMUSCULAR | Status: AC
  Filled 2020-09-01: qty 1

## 2020-09-01 MED ORDER — OXYCODONE HCL 5 MG PO TABS
ORAL_TABLET | ORAL | Status: AC
  Filled 2020-09-01: qty 1

## 2020-09-01 MED ORDER — HYDROMORPHONE HCL 1 MG/ML IJ SOLN
0.2500 mg | INTRAMUSCULAR | Status: DC | PRN
  Administered 2020-09-01: 0.25 mg via INTRAVENOUS

## 2020-09-01 MED ORDER — BUPIVACAINE HCL (PF) 0.5 % IJ SOLN
INTRAMUSCULAR | Status: DC | PRN
  Administered 2020-09-01: 4 mL

## 2020-09-01 MED ORDER — LIDOCAINE 2% (20 MG/ML) 5 ML SYRINGE
INTRAMUSCULAR | Status: DC | PRN
  Administered 2020-09-01: 60 mg via INTRAVENOUS

## 2020-09-01 MED ORDER — CEFAZOLIN SODIUM-DEXTROSE 2-4 GM/100ML-% IV SOLN
INTRAVENOUS | Status: AC
  Filled 2020-09-01: qty 100

## 2020-09-01 MED ORDER — CEFAZOLIN SODIUM-DEXTROSE 1-4 GM/50ML-% IV SOLN
INTRAVENOUS | Status: AC
  Filled 2020-09-01: qty 50

## 2020-09-01 MED ORDER — HYDROMORPHONE HCL 1 MG/ML IJ SOLN
INTRAMUSCULAR | Status: AC
  Filled 2020-09-01: qty 0.5

## 2020-09-01 MED ORDER — PROPOFOL 10 MG/ML IV BOLUS
INTRAVENOUS | Status: DC | PRN
  Administered 2020-09-01: 200 mg via INTRAVENOUS

## 2020-09-01 MED ORDER — PROMETHAZINE HCL 25 MG/ML IJ SOLN: 6.2500 mg | INTRAMUSCULAR | Status: DC | PRN

## 2020-09-01 MED ORDER — FENTANYL CITRATE (PF) 100 MCG/2ML IJ SOLN
INTRAMUSCULAR | Status: DC | PRN
Start: 2020-09-01 — End: 2020-09-01
  Administered 2020-09-01 (×4): 25 ug via INTRAVENOUS

## 2020-09-01 MED ORDER — AMISULPRIDE (ANTIEMETIC) 5 MG/2ML IV SOLN: 10.0000 mg | Freq: Once | INTRAVENOUS | Status: DC | PRN

## 2020-09-01 MED ORDER — ONDANSETRON HCL 4 MG/2ML IJ SOLN
INTRAMUSCULAR | Status: AC
  Filled 2020-09-01: qty 2

## 2020-09-01 MED ORDER — MIDAZOLAM HCL 2 MG/2ML IJ SOLN
INTRAMUSCULAR | Status: AC
  Filled 2020-09-01: qty 2

## 2020-09-01 MED ORDER — HYDROCODONE-ACETAMINOPHEN 5-325 MG PO TABS: 1.0000 | ORAL_TABLET | ORAL | 0 refills | Status: DC | PRN

## 2020-09-01 MED ORDER — DEXTROSE 5 % IV SOLN
3.0000 g | INTRAVENOUS | Status: AC
  Administered 2020-09-01: 3 g via INTRAVENOUS
  Filled 2020-09-01: qty 3000

## 2020-09-01 MED ORDER — CHLORHEXIDINE GLUCONATE 4 % EX LIQD: 60.0000 mL | Freq: Once | CUTANEOUS | Status: DC

## 2020-09-01 MED ORDER — PROPOFOL 10 MG/ML IV BOLUS
INTRAVENOUS | Status: AC
  Filled 2020-09-01: qty 20

## 2020-09-01 MED ORDER — FENTANYL CITRATE (PF) 100 MCG/2ML IJ SOLN
INTRAMUSCULAR | Status: AC
  Filled 2020-09-01: qty 2

## 2020-09-01 MED ORDER — OXYCODONE HCL 5 MG PO TABS
5.0000 mg | ORAL_TABLET | Freq: Once | ORAL | Status: AC | PRN
  Administered 2020-09-01: 5 mg via ORAL

## 2020-09-01 MED ORDER — LACTATED RINGERS IV SOLN: INTRAVENOUS | Status: DC

## 2020-09-01 MED ORDER — LIDOCAINE 2% (20 MG/ML) 5 ML SYRINGE
INTRAMUSCULAR | Status: AC
  Filled 2020-09-01: qty 5

## 2020-09-01 MED ORDER — DEXAMETHASONE SODIUM PHOSPHATE 10 MG/ML IJ SOLN
INTRAMUSCULAR | Status: DC | PRN
  Administered 2020-09-01: 5 mg via INTRAVENOUS

## 2020-09-01 MED ORDER — MIDAZOLAM HCL 2 MG/2ML IJ SOLN
INTRAMUSCULAR | Status: DC | PRN
  Administered 2020-09-01: 2 mg via INTRAVENOUS

## 2020-09-01 MED ORDER — POVIDONE-IODINE 10 % EX SWAB: 2.0000 "application " | Freq: Once | CUTANEOUS | Status: DC

## 2020-09-01 MED ORDER — OXYCODONE HCL 5 MG/5ML PO SOLN: 5.0000 mg | Freq: Once | ORAL | Status: AC | PRN

## 2020-09-01 SURGICAL SUPPLY — 45 items
APL SKNCLS STERI-STRIP NONHPOA (GAUZE/BANDAGES/DRESSINGS) ×1
BENZOIN TINCTURE PRP APPL 2/3 (GAUZE/BANDAGES/DRESSINGS) ×1 IMPLANT
BLADE SURG 15 STRL LF DISP TIS (BLADE) ×1 IMPLANT
BLADE SURG 15 STRL SS (BLADE) ×2
BNDG CMPR 9X4 STRL LF SNTH (GAUZE/BANDAGES/DRESSINGS)
BNDG COHESIVE 3X5 TAN STRL LF (GAUZE/BANDAGES/DRESSINGS) IMPLANT
BNDG ELASTIC 4X5.8 VLCR STR LF (GAUZE/BANDAGES/DRESSINGS) IMPLANT
BNDG ESMARK 4X9 LF (GAUZE/BANDAGES/DRESSINGS) IMPLANT
CLSR STERI-STRIP ANTIMIC 1/2X4 (GAUZE/BANDAGES/DRESSINGS) ×1 IMPLANT
CORD BIPOLAR FORCEPS 12FT (ELECTRODE) ×2 IMPLANT
COVER BACK TABLE 60X90IN (DRAPES) ×2 IMPLANT
COVER MAYO STAND STRL (DRAPES) ×2 IMPLANT
COVER WAND RF STERILE (DRAPES) IMPLANT
CUFF TOURN SGL QUICK 18X4 (TOURNIQUET CUFF) ×1 IMPLANT
DECANTER SPIKE VIAL GLASS SM (MISCELLANEOUS) IMPLANT
DRAPE EXTREMITY T 121X128X90 (DISPOSABLE) ×2 IMPLANT
DRAPE SURG 17X23 STRL (DRAPES) ×2 IMPLANT
DURAPREP 26ML APPLICATOR (WOUND CARE) ×2 IMPLANT
GAUZE SPONGE 4X4 12PLY STRL (GAUZE/BANDAGES/DRESSINGS) ×2 IMPLANT
GAUZE XEROFORM 1X8 LF (GAUZE/BANDAGES/DRESSINGS) IMPLANT
GLOVE BIO SURGEON STRL SZ7.5 (GLOVE) ×3 IMPLANT
GLOVE BIOGEL PI IND STRL 8 (GLOVE) ×1 IMPLANT
GLOVE BIOGEL PI INDICATOR 8 (GLOVE) ×1
GOWN STRL REUS W/ TWL LRG LVL3 (GOWN DISPOSABLE) ×1 IMPLANT
GOWN STRL REUS W/ TWL XL LVL3 (GOWN DISPOSABLE) ×1 IMPLANT
GOWN STRL REUS W/TWL LRG LVL3 (GOWN DISPOSABLE) ×2
GOWN STRL REUS W/TWL XL LVL3 (GOWN DISPOSABLE) ×2
NDL HYPO 25GX1X1/2 BEV (NEEDLE) IMPLANT
NEEDLE HYPO 25GX1X1/2 BEV (NEEDLE) ×2 IMPLANT
NS IRRIG 1000ML POUR BTL (IV SOLUTION) ×2 IMPLANT
PACK BASIN DAY SURGERY FS (CUSTOM PROCEDURE TRAY) ×2 IMPLANT
PAD CAST 3X4 CTTN HI CHSV (CAST SUPPLIES) ×1 IMPLANT
PADDING CAST ABS 4INX4YD NS (CAST SUPPLIES) ×1
PADDING CAST ABS COTTON 4X4 ST (CAST SUPPLIES) ×1 IMPLANT
PADDING CAST COTTON 3X4 STRL (CAST SUPPLIES) ×2
STOCKINETTE 4X48 STRL (DRAPES) ×2 IMPLANT
SUT ETHILON 4 0 PS 2 18 (SUTURE) ×2 IMPLANT
SUT VIC AB 3-0 SH 27 (SUTURE) ×2
SUT VIC AB 3-0 SH 27X BRD (SUTURE) IMPLANT
SUT VIC AB 4-0 P2 18 (SUTURE) ×1 IMPLANT
SUT VICRYL 4-0 PS2 18IN ABS (SUTURE) ×1 IMPLANT
SYR BULB EAR ULCER 3OZ GRN STR (SYRINGE) ×2 IMPLANT
SYR CONTROL 10ML LL (SYRINGE) IMPLANT
TOWEL GREEN STERILE FF (TOWEL DISPOSABLE) ×4 IMPLANT
UNDERPAD 30X36 HEAVY ABSORB (UNDERPADS AND DIAPERS) ×2 IMPLANT

## 2020-09-01 NOTE — Op Note (Signed)
Preop diagnosis: Right first dorsal compartment stenosing tenosynovitis.  Postop diagnosis: Same  Procedure: Right first dorsal compartment release.  Surgeon: Annell Greening, MD  Anesthesia: General +5 cc Marcaine local.  Tourniquet: 250 x 7 minutes.  Procedure: After induction general anesthesia proximal arm tourniquet DuraPrep usual extremity sheets and drapes were applied sterile skin marker was used for an S-shaped incision over the first dorsal compartment.  Arm is elevated wrapped with an Esmarch tourniquet inflated Esmarch removed.  Timeout procedure had been completed.  S shaped incision was made over the first dorsal compartment just through the skin and then blunt dissection to mobilize the vein and branches of the superficial radial nerve.  First dorsal compartment was then visualized self-retaining retractor with sin was placed on each side using the dull portion of the [facility] shaped like a right angle.  Compartment was opened with a 15 scalpel blade extended with Andria Meuse scissors proximally and distally there was stenosing tenosynovitis and to pieces of the abductor that were separate chronic tenosynovitis was present and complete release was performed proximally distally thumb was flexed and extended.  Some of the hypertrophic synovitis was debrided.  Wound was irrigated tourniquet deflated hemostasis obtained with bipolar cautery and then reapproximation of the deep tissue with 2-0 Vicryl 3-0 Vicryl subcuticular closure tincture benzoin Steri-Strips Marcaine infiltration 4 x 4's web roll and dorsal thumb splint for postop mobilization.  Patient tolerated procedure well transfer the care room in stable condition.

## 2020-09-01 NOTE — Anesthesia Preprocedure Evaluation (Signed)
Anesthesia Evaluation  Patient identified by MRN, date of birth, ID band Patient awake    Reviewed: Allergy & Precautions, H&P , NPO status , Patient's Chart, lab work & pertinent test results  Airway Mallampati: II  TM Distance: >3 FB Neck ROM: Full    Dental no notable dental hx. (+) Teeth Intact, Dental Advisory Given   Pulmonary sleep apnea , former smoker,  Less than 1/2 pack per day   Pulmonary exam normal breath sounds clear to auscultation       Cardiovascular Normal cardiovascular exam Rhythm:Regular Rate:Normal     Neuro/Psych  Headaches, Anxiety Depression Bipolar Disorder    GI/Hepatic   Endo/Other  Morbid obesity  Renal/GU      Musculoskeletal  (+) Arthritis , Osteoarthritis,  Fibromyalgia -  Abdominal (+) + obese,   Peds  Hematology   Anesthesia Other Findings   Reproductive/Obstetrics                             Anesthesia Physical  Anesthesia Plan  ASA: III  Anesthesia Plan: General   Post-op Pain Management:    Induction: Intravenous  PONV Risk Score and Plan: 3 and Ondansetron, Dexamethasone, Midazolam and Treatment may vary due to age or medical condition  Airway Management Planned: LMA  Additional Equipment:   Intra-op Plan:   Post-operative Plan: Extubation in OR  Informed Consent: I have reviewed the patients History and Physical, chart, labs and discussed the procedure including the risks, benefits and alternatives for the proposed anesthesia with the patient or authorized representative who has indicated his/her understanding and acceptance.     Dental advisory given  Plan Discussed with: Anesthesiologist and Surgeon  Anesthesia Plan Comments:         Anesthesia Quick Evaluation

## 2020-09-01 NOTE — Anesthesia Procedure Notes (Signed)
Procedure Name: LMA Insertion Date/Time: 09/01/2020 9:34 AM Performed by: Lucinda Dell, CRNA Pre-anesthesia Checklist: Patient identified, Emergency Drugs available, Suction available and Patient being monitored Patient Re-evaluated:Patient Re-evaluated prior to induction Oxygen Delivery Method: Circle system utilized Preoxygenation: Pre-oxygenation with 100% oxygen Induction Type: IV induction Ventilation: Mask ventilation without difficulty LMA: LMA inserted LMA Size: 4.0 Number of attempts: 1 Placement Confirmation: positive ETCO2 and breath sounds checked- equal and bilateral Tube secured with: Tape Dental Injury: Teeth and Oropharynx as per pre-operative assessment

## 2020-09-01 NOTE — H&P (Signed)
Office Visit Note              Patient: Robin Bender                                  Date of Birth: 1975-11-10                                                    MRN: [id number] Visit Date: 08/21/2020                                                                     Requested by: Royann Shivers, PA-C [address] [place],  [place] [address] PCP: Royann Shivers, PA-C   Assessment & Plan: Visit Diagnoses:  1. Tendinitis, de Quervain's     Plan: Patient has been through splinting she was approved for surgery by Dr. Case she relates.  She like proceed with outpatient surgery wrist first dorsal compartment release of her right wrist.  We discussed postoperative splint followed by removable splint.  Questions were elicited and answered.  Follow-Up Instructions: No follow-ups on file.   Orders:  No orders of the defined types were placed in this encounter.  No orders of the defined types were placed in this encounter.     Procedures: No procedures performed   Clinical Data: No additional findings.   Subjective:    Chief Complaint  Patient presents with   Right Wrist - Pain    HPI 44 year old female has seen Dr. Case in [place] and was supposed to get D curve Anes release right wrist and she states it is office is been close to her his contract was not renewed.  She has had previous carpal tunnel release by him 2005 which did well and also release of the left wrist.  She is right-hand dominant.  MRI scan has been done of the wrist which showed tenosynovitis and possible split the tendon which more likely is anatomic variant but she does have first dorsal compartment tenosynovitis by MRI scan done at [facility].  Patient is requesting that she be scheduled for surgery in [facility] as an outpatient.  Review of Systems she has children and stepchildren totaling 6.  Previous C-section wisdom of the surgery appendectomy hysterectomy all without  problems.  Patient does not smoke or drink.  She has had past problems with depression pneumonia acid reflux bronchitis.  Positive for morbid obesity.  All other systems are negative.   Objective: Vital Signs: Ht 5\' 4"  (1.626 m)    Wt 280 lb (127 kg)    BMI 48.06 kg/m   Physical Exam Constitutional:      Appearance: She is well-developed.  HENT:     Head: Normocephalic.     Right Ear: External ear normal.     Left Ear: External ear normal.  Eyes:     Pupils: Pupils are equal, round, and reactive to light.  Neck:     Thyroid: No thyromegaly.     Trachea: No tracheal deviation.  Cardiovascular:     Rate and Rhythm: Normal rate.  Pulmonary:     Effort: Pulmonary effort is normal.  Abdominal:     Palpations: Abdomen is soft.  Skin:    General: Skin is warm and dry.  Neurological:     Mental Status: She is alert and oriented to person, place, and time.  Psychiatric:        Behavior: Behavior normal.     Ortho Exam well-healed left first dorsal compartment incision.  Positive Finkelstein right with extreme pain with palpation of the first dorsal compartment.  Healed carpal tunnel incision negative Phalen's test.  Good cervical range of motion full elbow extension flexion.  Positive Finkelstein test on the right negative on the left.  Specialty Comments:  No specialty comments available.  Imaging: No results found.   PMFS History:     Patient Active Problem List   Diagnosis Date Noted   Tendinitis, de Quervain's 08/21/2020   Labia minora hypertrophy 07/16/2020   Hyperlipidemia 03/26/2020   Family history of heart disease 03/26/2020   Tobacco abuse 03/26/2020   PTSD (post-traumatic stress disorder) 01/10/2017   Major depressive disorder, recurrent episode, moderate (HCC) 01/10/2017   Generalized anxiety disorder 01/10/2017   Obesity, morbid, BMI 40.0-49.9 (HCC) 11/19/2016   Degenerative lumbar disc 11/19/2016   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    Condyloma acuminatum of vulva 10/07/2016   Obstructive sleep apnea syndrome 04/04/2015   Arthritis of both knees 06/02/2014   Fibromyalgia 01/23/2014   Chronic bilateral low back pain without sciatica 01/23/2014   Breast pain 10/17/2012   PATELLO-FEMORAL SYNDROME 03/03/2009   Past Medical History:  Diagnosis Date   Anemia    Anxiety    Arthritis    Bipolar disorder (HCC)    Depression    Fatigue    Fibromyalgia    Headache    Hypertension    PTSD (post-traumatic stress disorder)          Family History  Problem Relation Age of Onset   Cancer Mother        cervical   Hypertension Mother    Heart disease Mother    Arthritis/Rheumatoid Mother          Past Surgical History:  Procedure Laterality Date   ABDOMINAL HYSTERECTOMY     APPENDECTOMY     CARPAL TUNNEL RELEASE     CESAREAN SECTION  2002, 2004   DE QUERVAIN'S RELEASE Left 07/03/2020   LAPAROSCOPIC APPENDECTOMY N/A 05/19/2014   Procedure: APPENDECTOMY LAPAROSCOPIC;  Surgeon: Dalia Heading, MD;  Location: [facility];  Service: General;  Laterality: N/A;   ovarian cyst removed     Social History        Occupational History   Not on file  Tobacco Use   Smoking status: Current Every Day Smoker    Packs/day: 0.00    Years: 22.00    Pack years: 0.00    Types: Cigarettes   Smokeless tobacco: Never Used   Tobacco comment: reduce the # of cigarettes   Vaping Use   Vaping Use: Never used  Substance and Sexual Activity   Alcohol use: No   Drug use: No   Sexual activity: Yes    Partners: Male    Birth control/protection: Surgical    Comment: hyst

## 2020-09-01 NOTE — Transfer of Care (Signed)
Immediate Anesthesia Transfer of Care Note  Patient: [name]  Procedure(s) Performed: RIGHT 1ST DORSAL COMPARTMENT RELEASE (Right )  Patient Location: PACU  Anesthesia Type:General  Level of Consciousness: awake, alert , oriented and patient cooperative  Airway & Oxygen Therapy: Patient Spontanous Breathing and Patient connected to face mask oxygen  Post-op Assessment: Report given to RN, Post -op Vital signs reviewed and stable and Patient moving all extremities  Post vital signs: Reviewed and stable  Last Vitals:  Vitals Value Taken Time  BP 101/83 09/01/20 1010  Temp    Pulse 93 09/01/20 1012  Resp 16 09/01/20 1012  SpO2 98 % 09/01/20 1012  Vitals shown include unvalidated device data.  Last Pain:  Vitals:   09/01/20 1010  TempSrc:   PainSc: (P) 0-No pain         Complications: No complications documented.

## 2020-09-01 NOTE — Discharge Instructions (Signed)
Keep splint dry.  You can cover it with a bag to take a shower keep your hand propped up.  Elevation will decrease pain you can use ice on and off over the surgery site to help with pain.  Norco 5/325 30 tablets were called into your pharmacy for postop pain.  See Dr. Ophelia Charter in about 1 week.   Post Anesthesia Home Care Instructions  Activity: Get plenty of rest for the remainder of the day. A responsible individual must stay with you for 24 hours following the procedure.  For the next 24 hours, DO NOT: -Drive a car -Advertising copywriter -Drink alcoholic beverages -Take any medication unless instructed by your physician -Make any legal decisions or sign important papers.  Meals: Start with liquid foods such as gelatin or soup. Progress to regular foods as tolerated. Avoid greasy, spicy, heavy foods. If nausea and/or vomiting occur, drink only clear liquids until the nausea and/or vomiting subsides. Call your physician if vomiting continues.  Special Instructions/Symptoms: Your throat may feel dry or sore from the anesthesia or the breathing tube placed in your throat during surgery. If this causes discomfort, gargle with warm salt water. The discomfort should disappear within 24 hours.  If you had a scopolamine patch placed behind your ear for the management of post- operative nausea and/or vomiting:  1. The medication in the patch is effective for 72 hours, after which it should be removed.  Wrap patch in a tissue and discard in the trash. Wash hands thoroughly with soap and water. 2. You may remove the patch earlier than 72 hours if you experience unpleasant side effects which may include dry mouth, dizziness or visual disturbances. 3. Avoid touching the patch. Wash your hands with soap and water after contact with the patch.

## 2020-09-01 NOTE — Anesthesia Postprocedure Evaluation (Signed)
Anesthesia Post Note  Patient: Robin Bender  Procedure(s) Performed: RIGHT 1ST DORSAL COMPARTMENT RELEASE (Right Wrist)     Patient location during evaluation: PACU Anesthesia Type: General Level of consciousness: awake and alert Pain management: pain level controlled Vital Signs Assessment: post-procedure vital signs reviewed and stable Respiratory status: spontaneous breathing, nonlabored ventilation and respiratory function stable Cardiovascular status: blood pressure returned to baseline and stable Postop Assessment: no apparent nausea or vomiting Anesthetic complications: no   No complications documented.  Last Vitals:  Vitals:   09/01/20 1030 09/01/20 1100  BP:  (!) 109/55  Pulse: 89 89  Resp: 17 16  Temp:  (!) 36.3 C  SpO2: 93% 94%    Last Pain:  Vitals:   09/01/20 1100  TempSrc:   PainSc: 4                  Lowella Curb

## 2020-09-01 NOTE — Interval H&P Note (Signed)
History and Physical Interval Note:  09/01/2020 9:27 AM  [name]  has presented today for surgery, with the diagnosis of right wrist dequervain's stenosing tenosynovitis.  The various methods of treatment have been discussed with the patient and family. After consideration of risks, benefits and other options for treatment, the patient has consented to  Procedure(s): RIGHT 1ST DORSAL COMPARTMENT RELEASE (Right) as a surgical intervention.  The patient's history has been reviewed, patient examined, no change in status, stable for surgery.  I have reviewed the patient's chart and labs.  Questions were answered to the patient's satisfaction.     Robin Bender

## 2020-09-02 ENCOUNTER — Encounter (HOSPITAL_BASED_OUTPATIENT_CLINIC_OR_DEPARTMENT_OTHER): Payer: Self-pay | Admitting: Orthopaedic Surgery

## 2020-09-03 ENCOUNTER — Telehealth: Payer: Self-pay | Admitting: Orthopaedic Surgery

## 2020-09-03 NOTE — Telephone Encounter (Signed)
I called discussed. Leave on , use plastic bag for shower.FYI

## 2020-09-03 NOTE — Telephone Encounter (Signed)
Patient called asked when can she remove the ace bandage from her wrist? Patient has post op visit 09/11/2020. The number to contact patient is [phone]

## 2020-09-03 NOTE — Telephone Encounter (Signed)
Please advise. Would you like patient to keep dressing on until follow up? Her post op appt is on 09/11/2020 in [place]. She was a first dorsal compartment release on 09/01/2020.

## 2020-09-03 NOTE — Telephone Encounter (Signed)
noted 

## 2020-09-11 ENCOUNTER — Ambulatory Visit (INDEPENDENT_AMBULATORY_CARE_PROVIDER_SITE_OTHER): Payer: Medicaid Other | Admitting: Orthopaedic Surgery

## 2020-09-11 ENCOUNTER — Other Ambulatory Visit: Payer: Self-pay

## 2020-09-11 ENCOUNTER — Encounter: Payer: Self-pay | Admitting: Orthopaedic Surgery

## 2020-09-11 VITALS — Ht 65.0 in | Wt 293.0 lb

## 2020-09-11 DIAGNOSIS — M65839 Other synovitis and tenosynovitis, unspecified forearm: Secondary | ICD-10-CM

## 2020-09-11 NOTE — Progress Notes (Signed)
Post-Op Visit Note   Patient: Robin Bender           Date of Birth: [birth year]           MRN: [id number] Visit Date: 09/11/2020 PCP: Royann Shivers, PA-C   Assessment & Plan: Post right first dorsal compartment release.  Mild swelling.  She is gotten fairly good pain relief.  She is on pain management contract for disc protrusions L4-5, L5-S1.  Recheck 3 weeks.  Happy the surgical result.  Chief Complaint:  Chief Complaint  Patient presents with  . Right Hand - Routine Post Op    09/01/2020 right 1st dorsal compartment release   Visit Diagnoses:  1. Stenosing tenosynovitis of wrist     Plan: Return 3 weeks.  She will slowly resume activities with the wrist try to avoid repetitive squeezing gripping pulling for few weeks.  Follow-Up Instructions: Return in about 3 weeks (around 10/02/2020).   Orders:  No orders of the defined types were placed in this encounter.  No orders of the defined types were placed in this encounter.   Imaging: No results found.  PMFS History: Patient Active Problem List   Diagnosis Date Noted  . Stenosing tenosynovitis of wrist   . Tendinitis, de Quervain's 08/21/2020  . 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 Hyperlipidemia 03/26/2020  . Family history of heart disease 03/26/2020  . Tobacco abuse 03/26/2020  . PTSD (post-traumatic stress disorder) 01/10/2017  . Major depressive disorder, recurrent episode, moderate (HCC) 01/10/2017  . Generalized anxiety disorder 01/10/2017  . Obesity, morbid, BMI 40.0-49.9 (HCC) 11/19/2016  . Degenerative lumbar disc 11/19/2016  . 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 Condyloma acuminatum of vulva 10/07/2016  . Obstructive sleep apnea syndrome 04/04/2015  . Arthritis of both knees 06/02/2014  . Fibromyalgia 01/23/2014  . Chronic bilateral low back pain without sciatica 01/23/2014  . Breast pain 10/17/2012  . PATELLO-FEMORAL SYNDROME 03/03/2009   Past Medical History:  Diagnosis Date  .  Anemia   . Anxiety   . Arthritis   . Bipolar disorder (HCC)   . Depression   . Fatigue   . Fibromyalgia   . Headache   . Pneumonia due to COVID-19 virus 07/30/2020   [facility]  . PTSD (post-traumatic stress disorder)     Family History  Problem Relation Age of Onset  . Cancer Mother        cervical  . Hypertension Mother   . Heart disease Mother   . Arthritis/Rheumatoid Mother     Past Surgical History:  Procedure Laterality Date  . ABDOMINAL HYSTERECTOMY    . APPENDECTOMY    . CARPAL TUNNEL RELEASE    . CESAREAN SECTION  2002, 2004  . DE QUERVAIN'S RELEASE Left 07/03/2020  . DORSAL COMPARTMENT RELEASE Right 09/01/2020   Procedure: RIGHT 1ST DORSAL COMPARTMENT RELEASE;  Surgeon: Eldred Manges, MD;  Location: [facility];  Service: Orthopedics;  Laterality: Right;  . LAPAROSCOPIC APPENDECTOMY N/A 05/19/2014   Procedure: APPENDECTOMY LAPAROSCOPIC;  Surgeon: Dalia Heading, MD;  Location: [facility];  Service: General;  Laterality: N/A;  . ovarian cyst removed     Social History   Occupational History  . Not on file  Tobacco Use  . Smoking status: Former Smoker    Packs/day: 0.00    Years: 22.00    Pack years: 0.00    Types: Cigarettes  . Smokeless tobacco: Never Used  . Tobacco comment: reduce the # of cigarettes   Vaping  Use  . Vaping Use: Never used  Substance and Sexual Activity  . Alcohol use: No  . Drug use: No  . Sexual activity: Yes    Partners: Male    Birth control/protection: Surgical    Comment: hyst

## 2020-09-19 ENCOUNTER — Other Ambulatory Visit (HOSPITAL_BASED_OUTPATIENT_CLINIC_OR_DEPARTMENT_OTHER): Payer: Self-pay

## 2020-09-19 DIAGNOSIS — R5383 Other fatigue: Secondary | ICD-10-CM

## 2020-09-19 DIAGNOSIS — R0683 Snoring: Secondary | ICD-10-CM

## 2020-09-19 DIAGNOSIS — R0681 Apnea, not elsewhere classified: Secondary | ICD-10-CM

## 2020-10-09 ENCOUNTER — Other Ambulatory Visit: Payer: Self-pay

## 2020-10-09 ENCOUNTER — Ambulatory Visit (INDEPENDENT_AMBULATORY_CARE_PROVIDER_SITE_OTHER): Payer: Medicaid Other | Admitting: Orthopaedic Surgery

## 2020-10-09 ENCOUNTER — Encounter: Payer: Self-pay | Admitting: Orthopaedic Surgery

## 2020-10-09 VITALS — Ht 65.0 in | Wt 293.0 lb

## 2020-10-09 DIAGNOSIS — M654 Radial styloid tenosynovitis [de Quervain]: Secondary | ICD-10-CM

## 2020-10-09 NOTE — Progress Notes (Signed)
Postop right wrist first dorsal compartment release.  Incision looks good she states is still tender if she bumps it.  Youngest child at home is age 43 she still very active and states is continuing to give her some discomfort but is not giving her sharp electrical pain like she felt before.  We will place her in a thumb gutter splint she can use intermittently and recheck her if she has persistent symptoms.

## 2020-10-16 ENCOUNTER — Other Ambulatory Visit: Payer: Self-pay

## 2020-10-16 ENCOUNTER — Ambulatory Visit: Payer: Medicaid Other | Attending: Physician Assistant | Admitting: Internal Medicine

## 2020-10-16 DIAGNOSIS — R0683 Snoring: Secondary | ICD-10-CM | POA: Diagnosis present

## 2020-10-16 DIAGNOSIS — G4733 Obstructive sleep apnea (adult) (pediatric): Secondary | ICD-10-CM | POA: Insufficient documentation

## 2020-10-16 DIAGNOSIS — R5383 Other fatigue: Secondary | ICD-10-CM

## 2020-10-16 DIAGNOSIS — R0681 Apnea, not elsewhere classified: Secondary | ICD-10-CM

## 2020-10-20 ENCOUNTER — Ambulatory Visit: Payer: Self-pay | Admitting: Obstetrics & Gynecology

## 2020-11-03 ENCOUNTER — Other Ambulatory Visit: Payer: Self-pay | Admitting: *Deleted

## 2020-11-03 MED ORDER — ESTRADIOL 1 MG PO TABS
1.0000 mg | ORAL_TABLET | Freq: Every day | ORAL | 11 refills | Status: DC
Start: 2020-11-03 — End: 2022-02-04

## 2020-11-04 ENCOUNTER — Ambulatory Visit: Payer: Self-pay | Admitting: Obstetrics & Gynecology

## 2020-11-15 NOTE — Procedures (Signed)
[place]   Patient Name: Robin Bender, Robin Bender Date: 10/16/2020 Gender: Female D.O.B: 21-Sep-1976 Age (years): 44 Referring Provider: Clemmie Krill PA-C Height (inches): 64 Interpreting Physician: Baird Lyons MD, ABSM Weight (lbs): 297 RPSGT: Peak, Robert BMI: 51 MRN: [id number] Neck Size: 18.00  CLINICAL INFORMATION Sleep Study Type: Split Night CPAP Indication for sleep study: Snoring Epworth Sleepiness Score: 17  SLEEP STUDY TECHNIQUE As per the AASM Manual for the Scoring of Sleep and Associated Events v2.3 (April 2016) with a hypopnea requiring 4% desaturations.  The channels recorded and monitored were frontal, central and occipital EEG, electrooculogram (EOG), submentalis EMG (chin), nasal and oral airflow, thoracic and abdominal wall motion, anterior tibialis EMG, snore microphone, electrocardiogram, and pulse oximetry. Continuous positive airway pressure (CPAP) was initiated when the patient met split night criteria and was titrated according to treat sleep-disordered breathing.  MEDICATIONS Medications self-administered by patient taken the night of the study : none reported  RESPIRATORY PARAMETERS Diagnostic  Total AHI (/hr): 52.7 RDI (/hr): 52.7 OA Index (/hr): - CA Index (/hr): 0.0 REM AHI (/hr): 60.6 NREM AHI (/hr): 50.8 Supine AHI (/hr): 44.6 Non-supine AHI (/hr): 62.4 Min O2 Sat (%): 65.00 Mean O2 (%): 87.73 Time below 88% (min): 106.5   Titration  Optimal Pressure (cm): 9 AHI at Optimal Pressure (/hr): 9.1 Min O2 at Optimal Pressure (%): 85.0 Supine % at Optimal (%): 0 Sleep % at Optimal (%): 98   SLEEP ARCHITECTURE The recording time for the entire night was 391.2 minutes.  During a baseline period of 288.9 minutes, the patient slept for 241.5 minutes in REM and nonREM,  yielding a sleep efficiency of 83.6%. Sleep onset after lights out was 32.5 minutes with a REM latency of 127.0 minutes. The patient spent 2.69% of the night in stage N1 sleep, 63.15% in stage N2 sleep, 14.91% in stage N3 and 19.3% in REM.  During the titration period of 100.6 minutes, the patient slept for 90.5 minutes in REM and nonREM, yielding a sleep efficiency of 89.9%. Sleep onset after CPAP initiation was 9.0 minutes with a REM latency of 46.0 minutes. The patient spent 1.10% of the night in stage N1 sleep, 30.39% in stage N2 sleep, 18.23% in stage N3 and 50.3% in REM.  CARDIAC DATA The 2 lead EKG demonstrated sinus rhythm. The mean heart rate was 108.20 beats per minute. Other EKG findings include: None.  LEG MOVEMENT DATA The total Periodic Limb Movements of Sleep (PLMS) were 0. The PLMS index was 0.00 .  IMPRESSIONS - Severe obstructive sleep apnea occurred during the diagnostic portion of the study (AHI = 52.7/hour). - Within available study time, PAP pressure was selected for this patient (  9 cm of water). Residual AHI 9.1/ hr. - No significant central sleep apnea occurred during the diagnostic portion of the study (CAI = 0.0/hour). - Oxygen desaturation was noted during the diagnostic portion of the study (Min O2 = 65.00%). Mean O2 saturation on CPAP 9 was 89.6%. - The patient snored with loud snoring volume during the diagnostic portion of the study. - No cardiac abnormalities were noted during this study. - Clinically significant periodic limb movements did not occur during sleep.  DIAGNOSIS - Obstructive Sleep Apnea (G47.33)  RECOMMENDATIONS - Trial of CPAP therapy on autopap 5-15 cwp. - Patient used a Medium size Resmed Nasal Cradle Mask AirFit N30 mask and heated humidification. - Be careful with alcohol, sedatives and other CNS depressants that may worsen sleep apnea and disrupt normal sleep architecture. - Sleep hygiene should be reviewed to assess factors that may  improve sleep quality. - Weight management and regular exercise should be initiated or continued.  [Electronically signed] 11/15/2020 12:10 PM  Baird Lyons MD, [place], American Board of Sleep Medicine   NPI: [id number]                       [place], [place] of Sleep [facility]  ELECTRONICALLY SIGNED ON:  11/15/2020, 11:58 AM [place] PH: [phone]   FX: [phone] [place]

## 2020-11-18 ENCOUNTER — Ambulatory Visit (INDEPENDENT_AMBULATORY_CARE_PROVIDER_SITE_OTHER): Payer: Medicaid Other | Admitting: Obstetrics & Gynecology

## 2020-11-18 ENCOUNTER — Other Ambulatory Visit: Payer: Self-pay

## 2020-11-18 ENCOUNTER — Encounter: Payer: Self-pay | Admitting: Obstetrics & Gynecology

## 2020-11-18 VITALS — BP 124/75 | HR 101 | Ht 64.0 in | Wt 300.0 lb

## 2020-11-18 DIAGNOSIS — N906 Unspecified hypertrophy of vulva: Secondary | ICD-10-CM | POA: Diagnosis not present

## 2020-11-18 NOTE — Progress Notes (Signed)
Chief Complaint  Patient presents with  . Condyloma Removal Evaluation      45 y.o. [id number] No LMP recorded. Patient has had a hysterectomy. The current method of family planning is hysterectomy.  Outpatient Encounter Medications as of 11/18/2020  Medication Sig  . aspirin EC 81 MG tablet Take 81 mg by mouth daily. Swallow whole.  [name] atorvastatin (LIPITOR) 20 MG tablet Take 20 mg by mouth daily.  [name] estradiol (ESTRACE) 1 MG tablet Take 1 tablet (1 mg total) by mouth daily.  [name] HYDROcodone-acetaminophen (NORCO/VICODIN) 5-325 MG tablet Take 1 tablet by mouth every 4 (four) hours as needed for moderate pain.  [name] linaclotide (LINZESS) 72 MCG capsule Take 72 mcg by mouth.   . metFORMIN (GLUCOPHAGE-XR) 500 MG 24 hr tablet Take 500 mg by mouth daily.  [name] PROAIR HFA 108 (90 Base) MCG/ACT inhaler Inhale into the lungs.  . famotidine (PEPCID) 20 MG tablet Take 20 mg by mouth 2 (two) times daily. (Patient not taking: Reported on 11/18/2020)   No facility-administered encounter medications on file as of 11/18/2020.    Subjective Pt with left labia minora hypertrophy that is symptomatic in daily life and is a real issue with sexual intercourse Has been like that her entire life but has gotten progressively worse Past Medical History:  Diagnosis Date  . Anemia   . Anxiety   . Arthritis   . Bipolar disorder (HCC)   . Depression   . Fatigue   . Fibromyalgia   . Headache   . Pneumonia due to COVID-19 virus 07/30/2020   [facility]  . PTSD (post-traumatic stress disorder)     Past Surgical History:  Procedure Laterality Date  . ABDOMINAL HYSTERECTOMY    . APPENDECTOMY    . CARPAL TUNNEL RELEASE    . CESAREAN SECTION  2002, 2004  . DE QUERVAIN'S RELEASE Left 07/03/2020  . DORSAL COMPARTMENT RELEASE Right 09/01/2020   Procedure: RIGHT 1ST DORSAL COMPARTMENT RELEASE;  Surgeon: Eldred Manges, MD;  Location: [facility];  Service: Orthopedics;  Laterality: Right;  .  LAPAROSCOPIC APPENDECTOMY N/A 05/19/2014   Procedure: APPENDECTOMY LAPAROSCOPIC;  Surgeon: Dalia Heading, MD;  Location: [facility];  Service: General;  Laterality: N/A;  . ovarian cyst removed      OB History    Gravida  2   Para  2   Term  2   Preterm      AB      Living  2     SAB      IAB      Ectopic      Multiple      Live Births              Allergies  Allergen Reactions  . Ampicillin-Sulbactam Sodium     Rash, redness noted around iv site,   . Codeine Other (See Comments)    Blisters around mouth  . Demerol Hives    Social History   Socioeconomic History  . Marital status: Divorced    Spouse name: Not on file  . Number of children: Not on file  . Years of education: Not on file  . Highest education level: Not on file  Occupational History  . Not on file  Tobacco Use  . Smoking status: Former Smoker    Packs/day: 0.00    Years: 22.00    Pack years: 0.00    Types: Cigarettes  . Smokeless tobacco: Never Used  .  Tobacco comment: reduce the # of cigarettes   Vaping Use  . Vaping Use: Never used  Substance and Sexual Activity  . Alcohol use: No  . Drug use: No  . Sexual activity: Yes    Partners: Male    Birth control/protection: Surgical    Comment: hyst  Other Topics Concern  . Not on file  Social History Narrative  . Not on file   Social Determinants of Health   Financial Resource Strain: Not on file  Food Insecurity: Not on file  Transportation Needs: Not on file  Physical Activity: Not on file  Stress: Not on file  Social Connections: Not on file    Family History  Problem Relation Age of Onset  . Cancer Mother        cervical  . Hypertension Mother   . Heart disease Mother   . Arthritis/Rheumatoid Mother     Medications:       Current Outpatient Medications:  .  aspirin EC 81 MG tablet, Take 81 mg by mouth daily. Swallow whole., Disp: , Rfl:  .  atorvastatin (LIPITOR) 20 MG tablet, Take 20 mg by mouth daily., Disp: ,  Rfl:  .  estradiol (ESTRACE) 1 MG tablet, Take 1 tablet (1 mg total) by mouth daily., Disp: 30 tablet, Rfl: 11 .  HYDROcodone-acetaminophen (NORCO/VICODIN) 5-325 MG tablet, Take 1 tablet by mouth every 4 (four) hours as needed for moderate pain., Disp: 25 tablet, Rfl: 0 .  linaclotide (LINZESS) 72 MCG capsule, Take 72 mcg by mouth. , Disp: , Rfl:  .  metFORMIN (GLUCOPHAGE-XR) 500 MG 24 hr tablet, Take 500 mg by mouth daily., Disp: , Rfl:  .  PROAIR HFA 108 (90 Base) MCG/ACT inhaler, Inhale into the lungs., Disp: , Rfl:  .  famotidine (PEPCID) 20 MG tablet, Take 20 mg by mouth 2 (two) times daily. (Patient not taking: Reported on 11/18/2020), Disp: , Rfl:   Objective Blood pressure 124/75, pulse (!) 101, height 5\' 4"  (1.626 m), weight 300 lb (136.1 kg).  General WDWN female NAD Vulva:  Left labia minora is dramatically larger than the right, the right is really disproportionately small, otherwise, the vulva with no masses, tenderness or lesions Vagina:  normal mucosa, no discharge    Pertinent ROS No burning with urination, frequency or urgency No nausea, vomiting or diarrhea Nor fever chills or other constitutional symptoms   Labs or studies     Impression Diagnoses this Encounter::   ICD-10-CM   1. Labial hypertrophy, left labia minora  N90.60     Established relevant diagnosis(es):   Plan/Recommendations: No orders of the defined types were placed in this encounter.   Labs or Scans Ordered: No orders of the defined types were placed in this encounter.   Management:: Plan in office partial vulvectomy/labioplasty 12/09/20 330 pm  Follow up No follow-ups on file.      All questions were answered.

## 2020-11-18 NOTE — Progress Notes (Signed)
Virtual Visit via Telephone Note   This visit type was conducted due to national recommendations for restrictions regarding the COVID-19 Pandemic (e.g. social distancing) in an effort to limit this patient's exposure and mitigate transmission in our community.  Due to her co-morbid illnesses, this patient is at least at moderate risk for complications without adequate follow up.  This format is felt to be most appropriate for this patient at this time.  The patient did not have access to video technology/had technical difficulties with video requiring transitioning to audio format only (telephone).  All issues noted in this document were discussed and addressed.  No physical exam could be performed with this format.  Please refer to the patient's chart for her  consent to telehealth for [facility].  Evaluation Performed:  Follow-up visit  This visit type was conducted due to national recommendations for restrictions regarding the COVID-19 Pandemic (e.g. social distancing).  This format is felt to be most appropriate for this patient at this time.  All issues noted in this document were discussed and addressed.  No physical exam was performed (except for noted visual exam findings with Video Visits).  Please refer to the patient's chart (MyChart message for video visits and phone note for telephone visits) for the patient's consent to telehealth for [facility]  Date:  11/20/2020   ID:  Robin Bender, DOB 12-23-1975, MRN [id number]  Patient Location:  [address] [place] [address]   Provider location:     [facility] [address] Office [phone] Fax [phone]   PCP:  Royann Shivers, PA-C  Cardiologist:  Nanetta Batty, MD  Electrophysiologist:  None   Chief Complaint: Hyperlipidemia  History of Present Illness:    Robin Bender is a 45 y.o. female who presents via audio/video conferencing for a  telehealth visit today.  Patient verified DOB and address.  She was seen and evaluated by Dr. Allyson Sabal on 03/26/2020.  She had been referred from her PCP due to her family history of heart disease.  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  Her risk factors were noted to be her 1 pack/day smoking, untreated hyperlipidemia, family history of heart disease.  Her mother had a MI at age 69 and had stents placed.  During that time she denied chest pain or shortness of breath.  She was started on statin therapy and a coronary calcium score was ordered.  Her coronary calcium score was 0.  She is seen virtually today and states she had COVID-19 pneumonia in September 2021.  She reports that her breathing has not returned to normal since that time.  She was also diagnosed with diabetes.  She has been working on eating a low-carb diet.  She reports she has eliminated soda and is eating many more fruits and vegetables.  She continues to do all of her housework for her and helps her mother.  She reports that she also helps her mother with regard work and is now walking more and when she goes to the grocery store.  I will give her the salty 6 diet sheet, have her continue to increase her physical activity as tolerated, repeat her fasting lipid panel 6/22, and have her follow-up in 12 months.  Today she denies chest pain, increased shortness of breath, lower extremity edema, fatigue, palpitations, melena, hematuria, hemoptysis,  diaphoresis, weakness, presyncope, syncope, orthopnea, and PND.   The patient does not symptoms concerning for COVID-19 infection (fever, chills, cough, or new SHORTNESS OF BREATH).    Prior CV studies:   The following studies were reviewed today:  Coronary calcium score 04/04/2020 Coronary Calcium Score  TECHNIQUE: The patient was scanned on a [employer] scanner. Axial non-contrast 3 mm slices were  carried out through the heart. The data set was analyzed on a dedicated work station and scored using the Agatson method.  MEDICATIONS: None  FINDINGS: Non-cardiac: See separate report from [facility].  Ascending Aorta: Normal size, no calcifications.  Pericardium: Normal.  Coronary arteries: Normal origin.  IMPRESSION: Coronary calcium score of 0. This was 0 percentile for age and sex matched control.  Electronically Signed: By: Tobias Alexander On: 04/04/2020 10:48  Past Medical History:  Diagnosis Date  . Anemia   . Anxiety   . Arthritis   . Bipolar disorder (HCC)   . Depression   . Fatigue   . Fibromyalgia   . Headache   . Pneumonia due to COVID-19 virus 07/30/2020   [facility]  . PTSD (post-traumatic stress disorder)    Past Surgical History:  Procedure Laterality Date  . ABDOMINAL HYSTERECTOMY    . APPENDECTOMY    . CARPAL TUNNEL RELEASE    . CESAREAN SECTION  2002, 2004  . DE QUERVAIN'S RELEASE Left 07/03/2020  . DORSAL COMPARTMENT RELEASE Right 09/01/2020   Procedure: RIGHT 1ST DORSAL COMPARTMENT RELEASE;  Surgeon: Eldred Manges, MD;  Location: [facility];  Service: Orthopedics;  Laterality: Right;  . LAPAROSCOPIC APPENDECTOMY N/A 05/19/2014   Procedure: APPENDECTOMY LAPAROSCOPIC;  Surgeon: Dalia Heading, MD;  Location: [facility];  Service: General;  Laterality: N/A;  . ovarian cyst removed       Current Meds  Medication Sig  . aspirin EC 81 MG tablet Take 81 mg by mouth daily. Swallow whole.  [name] atorvastatin (LIPITOR) 20 MG tablet Take 20 mg by mouth daily.  . Dulaglutide 0.75 MG/0.5ML SOPN Inject 0.75 mg into the skin. Trulicity  . estradiol (ESTRACE) 1 MG tablet Take 1 tablet (1 mg total) by mouth daily.  [name] HYDROcodone-acetaminophen (NORCO/VICODIN) 5-325 MG tablet Take 1 tablet by mouth every 4 (four) hours as needed for moderate pain.  [name] linaclotide (LINZESS) 72 MCG capsule Take 72 mcg by mouth.   . metFORMIN  (GLUCOPHAGE-XR) 500 MG 24 hr tablet Take 500 mg by mouth daily.  [name] PROAIR HFA 108 (90 Base) MCG/ACT inhaler Inhale into the lungs.     Allergies:   Ampicillin-sulbactam sodium, Codeine, and Demerol   Social History   Tobacco Use  . Smoking status: Former Smoker    Packs/day: 0.00    Years: 22.00    Pack years: 0.00    Types: Cigarettes  . Smokeless tobacco: Never Used  . Tobacco comment: reduce the # of cigarettes   Vaping Use  . Vaping Use: Never used  Substance Use Topics  . Alcohol use: No  . Drug use: No     Family Hx: The patient's family history includes Arthritis/Rheumatoid in her mother; Cancer in her mother; Heart disease in her mother; Hypertension in her mother.  ROS:   Please see the history of present illness.     All other systems reviewed and are negative.   Labs/Other Tests and Data Reviewed:    Recent Labs: 03/28/2020: ALT 24   Recent Lipid Panel Lab Results  Component Value Date/Time  CHOL 179 05/07/2020 09:40 AM   TRIG 218 (H) 05/07/2020 09:40 AM   HDL 43 05/07/2020 09:40 AM   CHOLHDL 4.2 05/07/2020 09:40 AM   LDLCALC 99 05/07/2020 09:40 AM    Wt Readings from Last 3 Encounters:  11/18/20 300 lb (136.1 kg)  10/09/20 293 lb (132.9 kg)  09/11/20 293 lb (132.9 kg)     Exam:    Vital Bender:  There were no vitals taken for this visit.   Well nourished, well developed female in no  acute distress.   ASSESSMENT & PLAN:    1.  Hyperlipidemia-05/07/2020: Cholesterol, Total 179; HDL 43; LDL Chol Calc (NIH) 99; Triglycerides 218.  Coronary calcium score 0 11/13/2019 Continue atorvastatin Heart healthy low-sodium high-fiber diet Increase physical activity as tolerated Repeat fasting lipid and liver 6/22  Tobacco abuse- stop smoking 9/21 after having COVID-19 pneumonia. Congratulated on smoking cessation  Family history of heart disease- reports mother had an MI at age 63 and required PCI with stenting.  Disposition: Follow-up with Dr. Allyson Sabal  in 12 months.  COVID-19 Education: The Bender and symptoms of COVID-19 were discussed with the patient and how to seek care for testing (follow up with PCP or arrange E-visit).  The importance of social distancing was discussed today.  Patient Risk:   After full review of this patients clinical status, I feel that they are at least moderate risk at this time.  Time:   Today, I have spent 8 minutes with the patient with telehealth technology discussing diet, exercise, medication, cholesterol.  I spent greater than 20 minutes reviewing this patient's past medical history, medications, and prior cardiac test.   Medication Adjustments/Labs and Tests Ordered: Current medicines are reviewed at length with the patient today.  Concerns regarding medicines are outlined above.   Tests Ordered: No orders of the defined types were placed in this encounter.  Medication Changes: No orders of the defined types were placed in this encounter.   Disposition:  in 1 year(s)  Signed, Thomasene Ripple. Lianny Molter NP-C    06/12/2019 11:58 AM    [facility] [address] Office [phone] Fax [phone]

## 2020-11-20 ENCOUNTER — Encounter: Payer: Self-pay | Admitting: General Practice

## 2020-11-20 ENCOUNTER — Telehealth (INDEPENDENT_AMBULATORY_CARE_PROVIDER_SITE_OTHER): Payer: Medicaid Other | Admitting: General Practice

## 2020-11-20 DIAGNOSIS — Z79899 Other long term (current) drug therapy: Secondary | ICD-10-CM

## 2020-11-20 DIAGNOSIS — Z8249 Family history of ischemic heart disease and other diseases of the circulatory system: Secondary | ICD-10-CM | POA: Diagnosis not present

## 2020-11-20 DIAGNOSIS — Z720 Tobacco use: Secondary | ICD-10-CM

## 2020-11-20 DIAGNOSIS — E782 Mixed hyperlipidemia: Secondary | ICD-10-CM | POA: Diagnosis not present

## 2020-11-20 NOTE — Patient Instructions (Addendum)
Medication Instructions:  The current medical regimen is effective;  continue present plan and medications as directed. Please refer to the Current Medication list given to you today.  *If you need a refill on your cardiac medications before your next appointment, please call your pharmacy*  Lab Work: FASTING LIPID PANEL IN June 2022. If you have labs (blood work) drawn today and your tests are completely normal, you will receive your results only by:  MyChart Message (if you have MyChart) OR A paper copy in the mail.  If you have any lab test that is abnormal or we need to change your treatment, we will call you to review the results. You may go to any [facility] that is convenient for you however, we do have a lab in our office that is able to assist you. You DO NOT need an appointment for our lab. The lab is open 8:00am and closes at 4:00pm. Lunch 12:45 - 1:45pm.  Special Instructions PLEASE READ AND FOLLOW SALTY 6-ATTACHED-1,800 mg daily  PLEASE INCREASE PHYSICAL ACTIVITY AS TOLERATED  Follow-Up: Your next appointment:  12 month(s) In Person OR AS NEEDED with Nanetta Batty, MD OR IF UNAVAILABLE JESSE CLEAVER, FNP-C  At [facility], you and your health needs are our priority.  As part of our continuing mission to provide you with exceptional heart care, we have created designated Provider Care Teams.  These Care Teams include your primary Cardiologist (physician) and Advanced Practice Providers (APPs -  Physician Assistants and Nurse Practitioners) who all work together to provide you with the care you need, when you need it.

## 2020-12-05 ENCOUNTER — Telehealth: Payer: Self-pay | Admitting: Obstetrics & Gynecology

## 2020-12-05 NOTE — Telephone Encounter (Signed)
Looks like 02/04/21 for partial vulvectomy  Thanks   Dr Despina Hidden

## 2020-12-05 NOTE — Telephone Encounter (Signed)
Patient called stating that she has an appointment with Dr. Despina Hidden on 12/09/2020 for a procedure partial vulvectomy. Patient states that her anxiety is off the roof and she is needing to be placed to sleep for this procedure. Patient would like that scheduled. Please contact pt

## 2020-12-09 ENCOUNTER — Ambulatory Visit: Payer: Medicaid Other | Admitting: Obstetrics & Gynecology

## 2021-01-30 NOTE — Patient Instructions (Signed)
Robin Bender  01/30/2021     @PREFPERIOPPHARMACY @   Your procedure is scheduled on  02/04/2021   Report to 02/06/2021 at  [facility]  A.M.   Call this number if you have problems the morning of surgery:  [phone]   Remember:  Do not eat or drink after midnight.                        Take these medicines the morning of surgery with A SIP OF WATER  Hydrocodone (if needed).  Place clean sheets on your bed the night before your procedure. DO NOT sleep with pets this night.  Shower the night before and the morning of your procedure with CHG. DO NOT put CHG on your face, hair or genitals.  After each shower, dry off with a clean towel, put on clean, comfortable clothes and brush your teeth.      Do not wear jewelry, make-up or nail polish.  Do not wear lotions, powders, or perfumes, or deodorant.  Do not shave 48 hours prior to surgery.  Men may shave face and neck.  Do not bring valuables to the hospital.  [facility] is not responsible for any belongings or valuables.   Contacts, dentures or bridgework may not be worn into surgery.  Leave your suitcase in the car.  After surgery it may be brought to your room.  For patients admitted to the hospital, discharge time will be determined by your treatment team.  Patients discharged the day of surgery will not be allowed to drive home and need someone with them for 24 hours.   Special instructions:  DO NOT smoke tobacco or vaper the morning of your procedure.    Please read over the following fact sheets that you were given. Coughing and Deep Breathing, Surgical Site Infection Prevention, Anesthesia Post-op Instructions and Care and Recovery After Surgery       Vulvectomy, Care After This sheet gives you information about how to care for yourself after your procedure. Your health care provider may also give you more specific instructions. If you have problems or questions, contact your health care provider. What  can I expect after the procedure? After the procedure, it is common to have:  Vaginal pain.  Vaginal numbness.  Vaginal swelling.  Bloody vaginal discharge. Follow these instructions at home: Activity  Rest as told by your health care provider.  Do not lift, push, or pull more than 5 lb (2.3 kg), or the limit that you are told, until your health care provider says that it is safe.  Avoid activities that take a lot of effort for as long as told by your health care provider. This includes any exercise.  Raise (elevate) your legs while sitting or lying down.  Avoid standing or sitting in one place for long periods of time.  Do not cross your legs, especially when sitting, until your health care provider approves.  Return to your normal activities as told by your health care provider. Ask your health care provider what activities are safe for you.   Bathing  Do not take baths, swim, or use a hot tub until your health care provider approves. Ask your health care provider if you can take showers. You may only be allowed to take sponge baths.  After passing urine or a bowel movement, wipe yourself from front to back and clean your vaginal area using a spray bottle.  If told by your health care provider, take a sitz bath to help with discomfort. This is a warm water bath you take while sitting down. ? Do this 3-4 times a day, or as often as told by your health care provider. ? The water should only come up to your hips and cover your buttocks. ? You may pat the area dry with a soft, clean towel. ? If needed, you may then gently dry the area with a hair dryer on a cool setting for 5-10 minutes. An enclosed box fan may also be used to gently dry the area.   Incision care  Follow instructions from your health care provider about how to take care of your incision areas. Make sure you: ? Wash your hands with soap and water before and after you change your bandages (dressing). If soap and  water are not available, use hand sanitizer. ? Change your dressing as told by your health care provider. ? Leave stitches (sutures), skin glue, adhesive strips, or surgical clips in place. These skin closures may need to stay in place for 2 weeks or longer. If adhesive strip edges start to loosen and curl up, you may trim the loose edges. Do not remove adhesive strips completely unless your health care provider tells you to do that.  Check your incision areas every day for signs of infection. It may be helpful to use a handheld mirror to do this. Check for: ? Redness, swelling, or pain that has gotten worse. ? More fluid or blood. ? Warmth. ? Pus or a bad smell.  If you were sent home with a drain, take care of it as told by your health care provider.   Lifestyle  Do not douche or use tampons until your health care provider approves.  Do not have sex until your health care provider approves. Tell your health care provider if you have pain or numbness when you return to sexual activity.  Wear cotton underwear and comfortable, loose-fitting clothing. Medicines  Take over-the-counter and prescription medicines only as told by your health care provider.  Ask your health care provider if the medicine prescribed to you: ? Requires you to avoid driving or using heavy machinery. ? Can cause constipation. You may need to take these actions to prevent or treat constipation:  Take over-the-counter or prescription medicines.  Eat foods that are high in fiber, such as beans, whole grains, and fresh fruits and vegetables.  Limit foods that are high in fat and processed sugars, such as fried or sweet foods. General instructions  Do not drive until your health care provider says that it is safe.  Drink enough fluid to keep your urine pale yellow.  Wear compression stockings as told by your health care provider. These stockings help to prevent blood clots and reduce swelling in your  legs.  Keep all follow-up visits as told by your health care provider. This is important. Contact a health care provider if:  You have any of these problems in the incision area: ? More redness, swelling, or pain around the incision. ? More fluid or blood coming from the incision. ? Pus or a bad smell coming from the incision. ? The incision feels warm to the touch. ? The incision breaks open.  You have a fever.  You have painful or bloody urination.  You feel nauseous or you vomit.  You have diarrhea.  You develop constipation.  You develop a rash.  You feel dizzy or light-headed.  You have pain that does not get better with medicine. Get help right away if you:  Faint.  Have leg or chest pain.  Have abdominal pain.  Have shortness of breath. Summary  After the procedure, it is common to have some pain, numbness, or swelling in the vaginal area. It is also common to have some bloody vaginal discharge.  Do not lift, push, or pull more than 5 lb (2.3 kg), or the limit that you are told, or engage in activities that take a lot of effort until your health care provider approves.  Follow instructions from your health care provider about how to take care of your incision.  Keep all follow-up visits as told by your health care provider. This is important. This information is not intended to replace advice given to you by your health care provider. Make sure you discuss any questions you have with your health care provider. Document Revised: 11/21/2018 Document Reviewed: 11/22/2018 Elsevier Patient Education  2021 Elsevier [employer]. General Anesthesia, Adult, Care After This sheet gives you information about how to care for yourself after your procedure. Your health care provider may also give you more specific instructions. If you have problems or questions, contact your health care provider. What can I expect after the procedure? After the procedure, the following side  effects are common:  Pain or discomfort at the IV site.  Nausea.  Vomiting.  Sore throat.  Trouble concentrating.  Feeling cold or chills.  Feeling weak or tired.  Sleepiness and fatigue.  Soreness and body aches. These side effects can affect parts of the body that were not involved in surgery. Follow these instructions at home: For the time period you were told by your health care provider:  Rest.  Do not participate in activities where you could fall or become injured.  Do not drive or use machinery.  Do not drink alcohol.  Do not take sleeping pills or medicines that cause drowsiness.  Do not make important decisions or sign legal documents.  Do not take care of children on your own.   Eating and drinking  Follow any instructions from your health care provider about eating or drinking restrictions.  When you feel hungry, start by eating small amounts of foods that are soft and easy to digest (bland), such as toast. Gradually return to your regular diet.  Drink enough fluid to keep your urine pale yellow.  If you vomit, rehydrate by drinking water, juice, or clear broth. General instructions  If you have sleep apnea, surgery and certain medicines can increase your risk for breathing problems. Follow instructions from your health care provider about wearing your sleep device: ? Anytime you are sleeping, including during daytime naps. ? While taking prescription pain medicines, sleeping medicines, or medicines that make you drowsy.  Have a responsible adult stay with you for the time you are told. It is important to have someone help care for you until you are awake and alert.  Return to your normal activities as told by your health care provider. Ask your health care provider what activities are safe for you.  Take over-the-counter and prescription medicines only as told by your health care provider.  If you smoke, do not smoke without supervision.  Keep  all follow-up visits as told by your health care provider. This is important. Contact a health care provider if:  You have nausea or vomiting that does not get better with medicine.  You cannot eat or drink without vomiting.  You have pain that does not get better with medicine.  You are unable to pass urine.  You develop a skin rash.  You have a fever.  You have redness around your IV site that gets worse. Get help right away if:  You have difficulty breathing.  You have chest pain.  You have blood in your urine or stool, or you vomit blood. Summary  After the procedure, it is common to have a sore throat or nausea. It is also common to feel tired.  Have a responsible adult stay with you for the time you are told. It is important to have someone help care for you until you are awake and alert.  When you feel hungry, start by eating small amounts of foods that are soft and easy to digest (bland), such as toast. Gradually return to your regular diet.  Drink enough fluid to keep your urine pale yellow.  Return to your normal activities as told by your health care provider. Ask your health care provider what activities are safe for you. This information is not intended to replace advice given to you by your health care provider. Make sure you discuss any questions you have with your health care provider. Document Revised: 07/10/2020 Document Reviewed: 02/07/2020 Elsevier Patient Education  2021 [employer].

## 2021-02-01 ENCOUNTER — Other Ambulatory Visit: Payer: Self-pay | Admitting: Obstetrics & Gynecology

## 2021-02-02 ENCOUNTER — Other Ambulatory Visit (HOSPITAL_COMMUNITY)
Admission: RE | Admit: 2021-02-02 | Discharge: 2021-02-02 | Disposition: A | Discharge status: Home / Self Care | Payer: Medicaid Other | Source: Ambulatory Visit | Attending: Obstetrics & Gynecology | Admitting: Obstetrics & Gynecology

## 2021-02-02 ENCOUNTER — Encounter (HOSPITAL_COMMUNITY)
Admission: RE | Admit: 2021-02-02 | Discharge: 2021-02-02 | Disposition: A | Discharge status: Home / Self Care | Payer: Medicaid Other | Source: Ambulatory Visit | Attending: Obstetrics & Gynecology | Admitting: Obstetrics & Gynecology

## 2021-02-02 ENCOUNTER — Telehealth: Payer: Self-pay | Admitting: Obstetrics & Gynecology

## 2021-02-02 ENCOUNTER — Other Ambulatory Visit: Payer: Self-pay

## 2021-02-02 ENCOUNTER — Encounter (HOSPITAL_COMMUNITY): Payer: Self-pay

## 2021-02-02 DIAGNOSIS — Z20822 Contact with and (suspected) exposure to covid-19: Secondary | ICD-10-CM | POA: Insufficient documentation

## 2021-02-02 DIAGNOSIS — Z01812 Encounter for preprocedural laboratory examination: Secondary | ICD-10-CM | POA: Insufficient documentation

## 2021-02-02 HISTORY — DX: Sleep apnea, unspecified: G47.30

## 2021-02-02 LAB — URINALYSIS, ROUTINE W REFLEX MICROSCOPIC
Bilirubin Urine: NEGATIVE
Glucose, UA: NEGATIVE mg/dL
Hgb urine dipstick: NEGATIVE
Ketones, ur: 5 mg/dL — AB
Leukocytes,Ua: NEGATIVE
Nitrite: NEGATIVE
Protein, ur: NEGATIVE mg/dL
Specific Gravity, Urine: 1.02 (ref 1.005–1.030)
pH: 7 (ref 5.0–8.0)

## 2021-02-02 LAB — COMPREHENSIVE METABOLIC PANEL
ALT: 31 U/L (ref 0–44)
AST: 37 U/L (ref 15–41)
Albumin: 3.7 g/dL (ref 3.5–5.0)
Alkaline Phosphatase: 68 U/L (ref 38–126)
Anion gap: 8 (ref 5–15)
BUN: 14 mg/dL (ref 6–20)
CO2: 25 mmol/L (ref 22–32)
Calcium: 8.8 mg/dL — ABNORMAL LOW (ref 8.9–10.3)
Chloride: 106 mmol/L (ref 98–111)
Creatinine, Ser: 0.46 mg/dL (ref 0.44–1.00)
GFR, Estimated: >60 mL/min (ref >60)
Glucose, Bld: 95 mg/dL (ref 70–99)
Potassium: 4 mmol/L (ref 3.5–5.1)
Sodium: 139 mmol/L (ref 135–145)
Total Bilirubin: 0.5 mg/dL (ref 0.3–1.2)
Total Protein: 7.2 g/dL (ref 6.5–8.1)

## 2021-02-02 LAB — CBC
HCT: 44.9 % (ref 36.0–46.0)
Hemoglobin: 14.3 g/dL (ref 12.0–15.0)
MCH: 29.1 pg (ref 26.0–34.0)
MCHC: 31.8 g/dL (ref 30.0–36.0)
MCV: 91.3 fL (ref 80.0–100.0)
Platelets: 265 10*3/uL (ref 150–400)
RBC: 4.92 MIL/uL (ref 3.87–5.11)
RDW: 13.3 % (ref 11.5–15.5)
WBC: 12.4 10*3/uL — ABNORMAL HIGH (ref 4.0–10.5)
nRBC: 0 % (ref 0.0–0.2)

## 2021-02-02 LAB — RAPID HIV SCREEN (HIV 1/2 AB+AG)
HIV 1/2 Antibodies: NONREACTIVE
HIV-1 P24 Antigen - HIV24: NONREACTIVE

## 2021-02-02 LAB — HEMOGLOBIN A1C
Hgb A1c MFr Bld: 6.4 % — ABNORMAL HIGH (ref 4.8–5.6)
Mean Plasma Glucose: 136.98 mg/dL

## 2021-02-02 LAB — SARS CORONAVIRUS 2 (TAT 6-24 HRS): SARS Coronavirus 2: NEGATIVE

## 2021-02-02 LAB — TYPE AND SCREEN
ABO/RH(D): O POS
Antibody Screen: NEGATIVE

## 2021-02-02 NOTE — Telephone Encounter (Signed)
Patient called stating that she is having surgery soon and she just did some blood work and her results came in but she needs a nurse to contact her and explain the results. Please contact pt

## 2021-02-03 NOTE — H&P (Signed)
Preoperative History and Physical  Robin Bender is a 45 y.o. [id number] with No LMP recorded. Patient has had a hysterectomy. admitted for a partial vulvectomy of the left vulva.   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 Anemia   . Anxiety   . Arthritis   . Bipolar disorder (HCC)   . Depression   . Fatigue   . Fibromyalgia   . Headache   . Pneumonia due to COVID-19 virus 07/30/2020   [facility]  . PTSD (post-traumatic stress disorder)   . Sleep apnea     PSH:     Past Surgical History:  Procedure Laterality Date  . ABDOMINAL HYSTERECTOMY    . APPENDECTOMY    . CARPAL TUNNEL RELEASE    . CESAREAN SECTION  2002, 2004  . DE QUERVAIN'S RELEASE Left 07/03/2020  . DORSAL COMPARTMENT RELEASE Right 09/01/2020   Procedure: RIGHT 1ST DORSAL COMPARTMENT RELEASE;  Surgeon: Eldred Manges, MD;  Location: [facility];  Service: Orthopedics;  Laterality: Right;  . LAPAROSCOPIC APPENDECTOMY N/A 05/19/2014   Procedure: APPENDECTOMY LAPAROSCOPIC;  Surgeon: Dalia Heading, MD;  Location: [facility];  Service: General;  Laterality: N/A;  . ovarian cyst removed      POb/GynH:      OB History    Gravida  2   Para  2   Term  2   Preterm      AB      Living  2     SAB      IAB      Ectopic      Multiple      Live Births              SH:   Social History   Tobacco Use  . Smoking status: Former Smoker    Packs/day: 0.00    Years: 22.00    Pack years: 0.00    Types: Cigarettes  . Smokeless tobacco: Never Used  . Tobacco comment: reduce the # of cigarettes   Vaping Use  . Vaping Use: Never used  Substance Use Topics  . Alcohol use: No  . Drug use: No    FH:    Family History  Problem Relation Age of Onset  . Cancer Mother        cervical  .  Hypertension Mother   . Heart disease Mother   . Arthritis/Rheumatoid Mother      Allergies:  Allergies  Allergen Reactions  . Codeine Other (See Comments)    Blisters around mouth  . Demerol Hives  . Ampicillin-Sulbactam Sodium Rash    Rash, redness noted around iv site,     Medications:      No current facility-administered medications for this encounter.  Current Outpatient Medications:  .  atorvastatin (LIPITOR) 20 MG tablet, Take 20 mg by mouth daily., Disp: , Rfl:  .  Dulaglutide 0.75 MG/0.5ML SOPN, Inject 0.75 mg into the skin every 7 (seven) days. Trulicity, Disp: , Rfl:  .  estradiol (ESTRACE) 1 MG tablet, Take 1 tablet (1 mg total) by mouth daily., Disp: 30 tablet, Rfl: 11 .  HYDROcodone-acetaminophen (NORCO/VICODIN) 5-325 MG tablet, Take 1 tablet by mouth every 4 (four) hours as needed for moderate pain. (Patient taking differently: Take  1 tablet by mouth every 6 (six) hours as needed for moderate pain.), Disp: 25 tablet, Rfl: 0 .  metFORMIN (GLUCOPHAGE-XR) 500 MG 24 hr tablet, Take 500 mg by mouth at bedtime., Disp: , Rfl:  .  vitamin B-12 (CYANOCOBALAMIN) 1000 MCG tablet, Take 1,000 mcg by mouth daily., Disp: , Rfl:   Review of Systems:   Review of Systems  Constitutional: Negative for fever, chills, weight loss, malaise/fatigue and diaphoresis.  HENT: Negative for hearing loss, ear pain, nosebleeds, congestion, sore throat, neck pain, tinnitus and ear discharge.   Eyes: Negative for blurred vision, double vision, photophobia, pain, discharge and redness.  Respiratory: Negative for cough, hemoptysis, sputum production, shortness of breath, wheezing and stridor.   Cardiovascular: Negative for chest pain, palpitations, orthopnea, claudication, leg swelling and PND.  Gastrointestinal: Positive for abdominal pain. Negative for heartburn, nausea, vomiting, diarrhea, constipation, blood in stool and melena.  Genitourinary: Negative for dysuria, urgency, frequency, hematuria  and flank pain.  Musculoskeletal: Negative for myalgias, back pain, joint pain and falls.  Skin: Negative for itching and rash.  Neurological: Negative for dizziness, tingling, tremors, sensory change, speech change, focal weakness, seizures, loss of consciousness, weakness and headaches.  Endo/Heme/Allergies: Negative for environmental allergies and polydipsia. Does not bruise/bleed easily.  Psychiatric/Behavioral: Negative for depression, suicidal ideas, hallucinations, memory loss and substance abuse. The patient is not nervous/anxious and does not have insomnia.      PHYSICAL EXAM:  There were no vitals taken for this visit.    Vitals reviewed. Constitutional: She is oriented to person, place, and time. She appears well-developed and well-nourished.  HENT:  Head: Normocephalic and atraumatic.  Right Ear: External ear normal.  Left Ear: External ear normal.  Nose: Nose normal.  Mouth/Throat: Oropharynx is clear and moist.  Eyes: Conjunctivae and EOM are normal. Pupils are equal, round, and reactive to light. Right eye exhibits no discharge. Left eye exhibits no discharge. No scleral icterus.  Neck: Normal range of motion. Neck supple. No tracheal deviation present. No thyromegaly present.  Cardiovascular: Normal rate, regular rhythm, normal heart sounds and intact distal pulses.  Exam reveals no gallop and no friction rub.   No murmur heard. Respiratory: Effort normal and breath sounds normal. No respiratory distress. She has no wheezes. She has no rales. She exhibits no tenderness.  GI: Soft. Bowel sounds are normal. She exhibits no distension and no mass. There is tenderness. There is no rebound and no guarding.  Genitourinary:       Vulva is normal without lesions left labia majora substatially larger Vagina is pink moist without discharge Cervix absent Uterus is uterus absent Adnexa is negative with normal sized ovaries by sonogram  Musculoskeletal: Normal range of motion.  She exhibits no edema and no tenderness.  Neurological: She is alert and oriented to person, place, and time. She has normal reflexes. She displays normal reflexes. No cranial nerve deficit. She exhibits normal muscle tone. Coordination normal.  Skin: Skin is warm and dry. No rash noted. No erythema. No pallor.  Psychiatric: She has a normal mood and affect. Her behavior is normal. Judgment and thought content normal.    Labs: Results for orders placed or performed during the hospital encounter of 02/02/21 (from the past 336 hour(s))  SARS CORONAVIRUS 2 (TAT 6-24 HRS) Nasopharyngeal Nasopharyngeal Swab   Collection Time: 02/02/21 11:22 AM   Specimen: Nasopharyngeal Swab  Result Value Ref Range   SARS Coronavirus 2 NEGATIVE NEGATIVE  CBC   Collection Time: 02/02/21 11:22 AM  Result Value Ref Range   WBC 12.4 (H) 4.0 - 10.5 K/uL   RBC 4.92 3.87 - 5.11 MIL/uL   Hemoglobin 14.3 12.0 - 15.0 g/dL   HCT 17.7 93.9 - 03.0 %   MCV 91.3 80.0 - 100.0 fL   MCH 29.1 26.0 - 34.0 pg   MCHC 31.8 30.0 - 36.0 g/dL   RDW 09.2 33.0 - 07.6 %   Platelets 265 150 - 400 K/uL   nRBC 0.0 0.0 - 0.2 %  Comprehensive metabolic panel   Collection Time: 02/02/21 11:22 AM  Result Value Ref Range   Sodium 139 135 - 145 mmol/L   Potassium 4.0 3.5 - 5.1 mmol/L   Chloride 106 98 - 111 mmol/L   CO2 25 22 - 32 mmol/L   Glucose, Bld 95 70 - 99 mg/dL   BUN 14 6 - 20 mg/dL   Creatinine, Ser 2.26 0.44 - 1.00 mg/dL   Calcium 8.8 (L) 8.9 - 10.3 mg/dL   Total Protein 7.2 6.5 - 8.1 g/dL   Albumin 3.7 3.5 - 5.0 g/dL   AST 37 15 - 41 U/L   ALT 31 0 - 44 U/L   Alkaline Phosphatase 68 38 - 126 U/L   Total Bilirubin 0.5 0.3 - 1.2 mg/dL   GFR, Estimated >33 >35 mL/min   Anion gap 8 5 - 15  Rapid HIV screen (HIV 1/2 Ab+Ag)   Collection Time: 02/02/21 11:22 AM  Result Value Ref Range   HIV-1 P24 Antigen - HIV24 NON REACTIVE NON REACTIVE   HIV 1/2 Antibodies NON REACTIVE NON REACTIVE   Interpretation (HIV Ag Ab)      A  non reactive test result means that HIV 1 or HIV 2 antibodies and HIV 1 p24 antigen were not detected in the specimen.  Urinalysis, Routine w reflex microscopic Urine, Clean Catch   Collection Time: 02/02/21 11:22 AM  Result Value Ref Range   Color, Urine YELLOW YELLOW   APPearance HAZY (A) CLEAR   Specific Gravity, Urine 1.020 1.005 - 1.030   pH 7.0 5.0 - 8.0   Glucose, UA NEGATIVE NEGATIVE mg/dL   Hgb urine dipstick NEGATIVE NEGATIVE   Bilirubin Urine NEGATIVE NEGATIVE   Ketones, ur 5 (A) NEGATIVE mg/dL   Protein, ur NEGATIVE NEGATIVE mg/dL   Nitrite NEGATIVE NEGATIVE   Leukocytes,Ua NEGATIVE NEGATIVE  Type and screen   Collection Time: 02/02/21 11:22 AM  Result Value Ref Range   ABO/RH(D) O POS    Antibody Screen NEG    Sample Expiration 02/16/2021,2359    Extend sample reason      NO TRANSFUSIONS OR PREGNANCY IN THE PAST 3 MONTHS Performed at [facility], [address], [place], [place] [address]   Hemoglobin A1c   Collection Time: 02/02/21 11:36 AM  Result Value Ref Range   Hgb A1c MFr Bld 6.4 (H) 4.8 - 5.6 %   Mean Plasma Glucose 136.98 mg/dL    EKG: Orders placed or performed in visit on 03/26/20  . EKG 12-Lead    Imaging Studies: No results found.    Assessment: Left labia majora hypertrophy    Plan: Partial vulvectomy left vulva due to left labial hypertrophy  Lazaro Arms 02/03/2021 2:59 PM

## 2021-02-04 ENCOUNTER — Ambulatory Visit (HOSPITAL_COMMUNITY): Payer: Medicaid Other | Admitting: Anesthesiology

## 2021-02-04 ENCOUNTER — Encounter (HOSPITAL_COMMUNITY): Payer: Self-pay | Admitting: Obstetrics & Gynecology

## 2021-02-04 ENCOUNTER — Ambulatory Visit (HOSPITAL_COMMUNITY)
Admission: RE | Admit: 2021-02-04 | Discharge: 2021-02-04 | Disposition: A | Discharge status: Home / Self Care | Payer: Medicaid Other | Attending: Obstetrics & Gynecology | Admitting: Obstetrics & Gynecology

## 2021-02-04 ENCOUNTER — Encounter (HOSPITAL_COMMUNITY)
Admission: RE | Disposition: A | Discharge status: Home / Self Care | Payer: Self-pay | Source: Home / Self Care | Attending: Obstetrics & Gynecology

## 2021-02-04 ENCOUNTER — Telehealth: Payer: Self-pay | Admitting: Obstetrics & Gynecology

## 2021-02-04 ENCOUNTER — Other Ambulatory Visit: Payer: Self-pay

## 2021-02-04 DIAGNOSIS — Z8616 Personal history of COVID-19: Secondary | ICD-10-CM | POA: Diagnosis not present

## 2021-02-04 DIAGNOSIS — Z87891 Personal history of nicotine dependence: Secondary | ICD-10-CM | POA: Insufficient documentation

## 2021-02-04 DIAGNOSIS — N843 Polyp of vulva: Secondary | ICD-10-CM | POA: Insufficient documentation

## 2021-02-04 DIAGNOSIS — Z79890 Hormone replacement therapy (postmenopausal): Secondary | ICD-10-CM | POA: Diagnosis not present

## 2021-02-04 DIAGNOSIS — N906 Unspecified hypertrophy of vulva: Secondary | ICD-10-CM

## 2021-02-04 DIAGNOSIS — Z79899 Other long term (current) drug therapy: Secondary | ICD-10-CM | POA: Diagnosis not present

## 2021-02-04 DIAGNOSIS — Z7984 Long term (current) use of oral hypoglycemic drugs: Secondary | ICD-10-CM | POA: Insufficient documentation

## 2021-02-04 HISTORY — PX: VULVECTOMY PARTIAL: SHX6187

## 2021-02-04 LAB — GLUCOSE, CAPILLARY
Glucose-Capillary: 107 mg/dL — ABNORMAL HIGH (ref 70–99)
Glucose-Capillary: 96 mg/dL (ref 70–99)

## 2021-02-04 SURGERY — VULVECTOMY, PARTIAL
Anesthesia: General | Site: Vulva | Laterality: Left

## 2021-02-04 MED ORDER — DEXAMETHASONE SODIUM PHOSPHATE 10 MG/ML IJ SOLN
INTRAMUSCULAR | Status: DC | PRN
  Administered 2021-02-04: 5 mg via INTRAVENOUS

## 2021-02-04 MED ORDER — FENTANYL CITRATE (PF) 250 MCG/5ML IJ SOLN
INTRAMUSCULAR | Status: AC
  Filled 2021-02-04: qty 5

## 2021-02-04 MED ORDER — SCOPOLAMINE 1 MG/3DAYS TD PT72: 1.0000 | MEDICATED_PATCH | Freq: Once | TRANSDERMAL | Status: DC

## 2021-02-04 MED ORDER — BACITRACIN-NEOMYCIN-POLYMYXIN 400-5-5000 EX OINT
TOPICAL_OINTMENT | CUTANEOUS | Status: DC | PRN
  Administered 2021-02-04: 1 via TOPICAL

## 2021-02-04 MED ORDER — ROCURONIUM BROMIDE 10 MG/ML (PF) SYRINGE
PREFILLED_SYRINGE | INTRAVENOUS | Status: DC | PRN
  Administered 2021-02-04: 60 mg via INTRAVENOUS

## 2021-02-04 MED ORDER — ONDANSETRON HCL 4 MG/2ML IJ SOLN
INTRAMUSCULAR | Status: AC
  Filled 2021-02-04: qty 2

## 2021-02-04 MED ORDER — PROPOFOL 10 MG/ML IV BOLUS
INTRAVENOUS | Status: AC
  Filled 2021-02-04: qty 40

## 2021-02-04 MED ORDER — ORAL CARE MOUTH RINSE: 15.0000 mL | Freq: Once | OROMUCOSAL | Status: DC

## 2021-02-04 MED ORDER — BUPIVACAINE LIPOSOME 1.3 % IJ SUSP
INTRAMUSCULAR | Status: DC | PRN
  Administered 2021-02-04: 20 mL

## 2021-02-04 MED ORDER — LACTATED RINGERS IV SOLN: INTRAVENOUS | Status: DC

## 2021-02-04 MED ORDER — BACITRACIN-NEOMYCIN-POLYMYXIN 400-5-5000 EX OINT
TOPICAL_OINTMENT | CUTANEOUS | Status: AC
  Filled 2021-02-04: qty 1

## 2021-02-04 MED ORDER — CEFAZOLIN SODIUM-DEXTROSE 2-4 GM/100ML-% IV SOLN
2.0000 g | Freq: Once | INTRAVENOUS | Status: AC
  Administered 2021-02-04: 2 g via INTRAVENOUS
  Filled 2021-02-04: qty 100

## 2021-02-04 MED ORDER — MIDAZOLAM HCL 2 MG/2ML IJ SOLN
INTRAMUSCULAR | Status: DC | PRN
  Administered 2021-02-04: 2 mg via INTRAVENOUS

## 2021-02-04 MED ORDER — KETOROLAC TROMETHAMINE 30 MG/ML IJ SOLN
30.0000 mg | Freq: Once | INTRAMUSCULAR | Status: AC
  Administered 2021-02-04: 30 mg via INTRAVENOUS
  Filled 2021-02-04: qty 1

## 2021-02-04 MED ORDER — POVIDONE-IODINE 10 % EX SWAB: 2.0000 "application " | Freq: Once | CUTANEOUS | Status: DC

## 2021-02-04 MED ORDER — KETOROLAC TROMETHAMINE 10 MG PO TABS: 10.0000 mg | ORAL_TABLET | Freq: Three times a day (TID) | ORAL | 0 refills | Status: DC | PRN

## 2021-02-04 MED ORDER — SILVER SULFADIAZINE 1 % EX CREA
TOPICAL_CREAM | CUTANEOUS | Status: AC
  Filled 2021-02-04: qty 50

## 2021-02-04 MED ORDER — CEFAZOLIN SODIUM-DEXTROSE 1-4 GM/50ML-% IV SOLN
1.0000 g | Freq: Once | INTRAVENOUS | Status: AC
  Administered 2021-02-04: 1 g via INTRAVENOUS

## 2021-02-04 MED ORDER — FENTANYL CITRATE (PF) 100 MCG/2ML IJ SOLN: 25.0000 ug | INTRAMUSCULAR | Status: DC | PRN

## 2021-02-04 MED ORDER — LIDOCAINE HCL (CARDIAC) PF 100 MG/5ML IV SOSY
PREFILLED_SYRINGE | INTRAVENOUS | Status: DC | PRN
  Administered 2021-02-04: 80 mg via INTRATRACHEAL

## 2021-02-04 MED ORDER — PROPOFOL 10 MG/ML IV BOLUS
INTRAVENOUS | Status: DC | PRN
  Administered 2021-02-04: 250 mg via INTRAVENOUS

## 2021-02-04 MED ORDER — SUGAMMADEX SODIUM 500 MG/5ML IV SOLN
INTRAVENOUS | Status: DC | PRN
  Administered 2021-02-04: 300 mg via INTRAVENOUS

## 2021-02-04 MED ORDER — BUPIVACAINE LIPOSOME 1.3 % IJ SUSP
20.0000 mL | Freq: Once | INTRAMUSCULAR | Status: DC
  Filled 2021-02-04: qty 20

## 2021-02-04 MED ORDER — ROCURONIUM BROMIDE 10 MG/ML (PF) SYRINGE
PREFILLED_SYRINGE | INTRAVENOUS | Status: AC
  Filled 2021-02-04: qty 10

## 2021-02-04 MED ORDER — 0.9 % SODIUM CHLORIDE (POUR BTL) OPTIME
TOPICAL | Status: DC | PRN
  Administered 2021-02-04: 1000 mL

## 2021-02-04 MED ORDER — ONDANSETRON HCL 4 MG/2ML IJ SOLN: 4.0000 mg | Freq: Once | INTRAMUSCULAR | Status: DC | PRN

## 2021-02-04 MED ORDER — HYDROCODONE-ACETAMINOPHEN 5-325 MG PO TABS: 1.0000 | ORAL_TABLET | Freq: Four times a day (QID) | ORAL | 0 refills | Status: DC | PRN

## 2021-02-04 MED ORDER — ONDANSETRON 8 MG PO TBDP: 8.0000 mg | ORAL_TABLET | Freq: Three times a day (TID) | ORAL | 0 refills | Status: DC | PRN

## 2021-02-04 MED ORDER — ONDANSETRON HCL 4 MG/2ML IJ SOLN
INTRAMUSCULAR | Status: DC | PRN
  Administered 2021-02-04: 4 mg via INTRAVENOUS

## 2021-02-04 MED ORDER — CEFAZOLIN SODIUM-DEXTROSE 2-4 GM/100ML-% IV SOLN: 2.0000 g | INTRAVENOUS | Status: DC

## 2021-02-04 MED ORDER — CEFAZOLIN SODIUM-DEXTROSE 1-4 GM/50ML-% IV SOLN
INTRAVENOUS | Status: AC
  Filled 2021-02-04: qty 50

## 2021-02-04 MED ORDER — MIDAZOLAM HCL 2 MG/2ML IJ SOLN
INTRAMUSCULAR | Status: AC
  Filled 2021-02-04: qty 2

## 2021-02-04 MED ORDER — FENTANYL CITRATE (PF) 100 MCG/2ML IJ SOLN
INTRAMUSCULAR | Status: DC | PRN
  Administered 2021-02-04: 100 ug via INTRAVENOUS
  Administered 2021-02-04: 50 ug via INTRAVENOUS

## 2021-02-04 MED ORDER — DEXAMETHASONE SODIUM PHOSPHATE 10 MG/ML IJ SOLN
INTRAMUSCULAR | Status: AC
  Filled 2021-02-04: qty 1

## 2021-02-04 MED ORDER — BUPIVACAINE LIPOSOME 1.3 % IJ SUSP
INTRAMUSCULAR | Status: AC
  Filled 2021-02-04: qty 20

## 2021-02-04 MED ORDER — LIDOCAINE HCL (PF) 2 % IJ SOLN
INTRAMUSCULAR | Status: AC
  Filled 2021-02-04: qty 5

## 2021-02-04 MED ORDER — CHLORHEXIDINE GLUCONATE 0.12 % MT SOLN: 15.0000 mL | Freq: Once | OROMUCOSAL | Status: DC

## 2021-02-04 SURGICAL SUPPLY — 28 items
BAG HAMPER (MISCELLANEOUS) ×2 IMPLANT
BLADE SURG SZ10 CARB STEEL (BLADE) ×2 IMPLANT
CLOTH BEACON ORANGE TIMEOUT ST (SAFETY) ×2 IMPLANT
COVER LIGHT HANDLE STERIS (MISCELLANEOUS) ×4 IMPLANT
COVER WAND RF STERILE (DRAPES) ×2 IMPLANT
ELECT REM PT RETURN 9FT ADLT (ELECTROSURGICAL) ×2
ELECTRODE REM PT RTRN 9FT ADLT (ELECTROSURGICAL) ×1 IMPLANT
GAUZE 4X4 16PLY RFD (DISPOSABLE) ×2 IMPLANT
GLOVE ECLIPSE 8.0 STRL XLNG CF (GLOVE) ×2 IMPLANT
GLOVE SRG 8 PF TXTR STRL LF DI (GLOVE) ×1 IMPLANT
GLOVE SURG UNDER POLY LF SZ7 (GLOVE) ×4 IMPLANT
GLOVE SURG UNDER POLY LF SZ8 (GLOVE) ×2
GOWN STRL REUS W/TWL LRG LVL3 (GOWN DISPOSABLE) ×4 IMPLANT
GOWN STRL REUS W/TWL XL LVL3 (GOWN DISPOSABLE) ×2 IMPLANT
KIT TURNOVER KIT A (KITS) ×4 IMPLANT
MANIFOLD NEPTUNE II (INSTRUMENTS) ×2 IMPLANT
NDL HYPO 18GX1.5 BLUNT FILL (NEEDLE) ×1 IMPLANT
NEEDLE HYPO 18GX1.5 BLUNT FILL (NEEDLE) ×2 IMPLANT
NEEDLE HYPO 22GX1.5 SAFETY (NEEDLE) ×2 IMPLANT
PACK PERI GYN (CUSTOM PROCEDURE TRAY) ×2 IMPLANT
PAD ARMBOARD 7.5X6 YLW CONV (MISCELLANEOUS) ×2 IMPLANT
SET BASIN LINEN APH (SET/KITS/TRAYS/PACK) ×2 IMPLANT
SUT MNCRL+ AB 3-0 CT1 36 (SUTURE) IMPLANT
SUT MON AB 3-0 SH 27 (SUTURE) ×1 IMPLANT
SUT MONOCRYL AB 3-0 CT1 36IN (SUTURE)
SUT VIC AB 3-0 SH 27 (SUTURE) ×6
SUT VIC AB 3-0 SH 27X BRD (SUTURE) IMPLANT
SYR 20ML LL LF (SYRINGE) ×4 IMPLANT

## 2021-02-04 NOTE — Op Note (Signed)
Preoperative diagnosis: Left labial hypertrophy causing discomfort  Postoperative diagnosis: Same as above  Procedure: Partial left vulvectomy  Surgeon: Lazaro Arms, MD  Anesthesia: Laryngeal mask airway  Findings:  Patient has had a lifelong asymmetry of the left vulva which is become more pronounced throughout her life.  On exam it is dramatically enlarged compared to the left.  She states it increasingly is problematic with intercourse as well as every day life and spinal, quite uncomfortable  Description of operation: Patient was taken to the operating room and placed in the supine position where she underwent laryngeal mask airway anesthesia sHe was placed in dorsal lithotomy position She was prepped and draped in usual sterile fashion Metzenbaum scissors were used in the left vulva was cut to remove the dramatic increased in the left vulva Full attention was paid in order to avoid the left clitoral crura After removal of the 2 sides were pretty equivalent since there is generally a little postoperative hypertrophy slightly smaller than the right side currently Hemostasis and tissue closure was obtained with 3-0 Vicryl locking interlocking figure-of-eight sutures and overlapping 20 cc 1.3% Exparel was injected into the left vulva for postoperative pain management 266 mg total Estimated blood loss was 50 cc There was good hemostasis at the end of the procedure and no extension into the vulva or vagina was appreciated The patient was awakened from anesthesia taken recovery in good stable condition all counts were correct She was given Ancef and Toradol preoperatively  Lazaro Arms, MD 02/04/2021 9:03 AM

## 2021-02-04 NOTE — Interval H&P Note (Signed)
History and Physical Interval Note:  02/04/2021 7:17 AM  Robin Bender  has presented today for surgery, with the diagnosis of Labial Hypertrophy,left minora.  The various methods of treatment have been discussed with the patient and family. After consideration of risks, benefits and other options for treatment, the patient has consented to  Procedure(s): VULVECTOMY PARTIAL (N/A) as a surgical intervention.  The patient's history has been reviewed, patient examined, no change in status, stable for surgery.  I have reviewed the patient's chart and labs.  Questions were answered to the patient's satisfaction.     Lazaro Arms

## 2021-02-04 NOTE — Anesthesia Procedure Notes (Signed)
Procedure Name: Intubation Date/Time: 02/04/2021 7:36 AM Performed by: Karna Dupes, CRNA Pre-anesthesia Checklist: Patient identified, Emergency Drugs available, Suction available and Patient being monitored Patient Re-evaluated:Patient Re-evaluated prior to induction Oxygen Delivery Method: Circle system utilized Preoxygenation: Pre-oxygenation with 100% oxygen Induction Type: IV induction Ventilation: Mask ventilation without difficulty Laryngoscope Size: Mac and 3 Grade View: Grade I Tube type: Oral Number of attempts: 1 Airway Equipment and Method: Stylet Placement Confirmation: ETT inserted through vocal cords under direct vision,  positive ETCO2 and breath sounds checked- equal and bilateral Secured at: 21 cm Tube secured with: Tape Dental Injury: Teeth and Oropharynx as per pre-operative assessment

## 2021-02-04 NOTE — Progress Notes (Signed)
Patient called and stated that she felt like she was bleeding a lot. Consulted with Dr. Despina Hidden and he said to instruct patient to put on tight panties and a pad and to use an ice pack. Dr. Despina Hidden said that he would call patient later this afternoon to check on her.

## 2021-02-04 NOTE — Anesthesia Postprocedure Evaluation (Signed)
Anesthesia Post Note  Patient: Robin Bender  Procedure(s) Performed: VULVECTOMY PARTIAL (Left Vulva)  Patient location during evaluation: PACU Anesthesia Type: General Level of consciousness: awake and alert Pain management: pain level controlled Vital Signs Assessment: post-procedure vital signs reviewed and stable Respiratory status: spontaneous breathing and respiratory function stable Cardiovascular status: blood pressure returned to baseline Postop Assessment: no apparent nausea or vomiting Anesthetic complications: no   No complications documented.   Last Vitals:  Vitals:   02/04/21 0828 02/04/21 0847  BP: 97/69 124/87  Pulse: 81 86  Resp: 18 19  Temp: 36.7 C   SpO2: 98% 96%    Last Pain:  Vitals:   02/04/21 0847  TempSrc:   PainSc: 0-No pain                 Lorin Glass

## 2021-02-04 NOTE — Discharge Instructions (Signed)
PATIENT INSTRUCTIONS POST-ANESTHESIA  IMMEDIATELY FOLLOWING SURGERY:  Do not drive or operate machinery for the first twenty four hours after surgery.  Do not make any important decisions for twenty four hours after surgery or while taking narcotic pain medications or sedatives.  If you develop intractable nausea and vomiting or a severe headache please notify your doctor immediately.  FOLLOW-UP:  Please make an appointment with your surgeon as instructed. You do not need to follow up with anesthesia unless specifically instructed to do so.  WOUND CARE INSTRUCTIONS (if applicable):  Keep a dry clean dressing on the anesthesia/puncture wound site if there is drainage.  Once the wound has quit draining you may leave it open to air.  Generally you should leave the bandage intact for twenty four hours unless there is drainage.  If the epidural site drains for more than 36-48 hours please call the anesthesia department.  QUESTIONS?:  Please feel free to call your physician or the hospital operator if you have any questions, and they will be happy to assist you.      Vulvectomy  Vulvectomy is a surgical procedure to remove all or part of the outer female genital organs (vulva). The vulva includes the outer and inner lips of the vagina and the clitoris. You may need this surgery if you have a cancerous growth in your vulva. There are two types of vulvectomy:  A simple vulvectomy. This is the removal of the entire vulva.  A radical vulvectomy. A radical vulvectomy can be partial or complete. ? A partial radical vulvectomy is when part of the vulva and surrounding deep tissue is removed. ? A complete radical vulvectomy is when the vulva, clitoris, and surrounding deep tissue are removed. During a radical vulvectomy, some lymph nodes near the vulva may also be removed. Tell a health care provider about:  Any allergies you have.  All medicines you are taking, including vitamins, herbs, eye drops,  creams, and over-the-counter medicines.  Any problems you or family members have had with anesthetic medicines.  Any blood disorders you have.  Any surgeries you have had.  Any medical conditions you have.  Whether you are pregnant or may be pregnant. What are the risks? Generally, this is a safe procedure. However, problems may occur, including:  Infection.  Bleeding.  Allergic reactions to medicines.  Damage to other structures or organs.  Urinary tract infections.  Lymphedema. This is when your legs swell after the removal of lymph nodes from your groin area.  Pain or decreased sexual pleasure when having sex.  Long-term vaginal swelling, tightness, numbness, or pain.  A blood clot that may travel to the lung (pulmonary embolism). What happens before the procedure? Staying hydrated Follow instructions from your health care provider about hydration, which may include:  Up to 2 hours before the procedure - you may continue to drink clear liquids, such as water, clear fruit juice, black coffee, and plain tea. Eating and drinking restrictions Follow instructions from your health care provider about eating and drinking, which may include:  8 hours before the procedure - stop eating heavy meals or foods, such as meat, fried foods, or fatty foods.  6 hours before the procedure - stop eating light meals or foods, such as toast or cereal.  6 hours before the procedure - stop drinking milk or drinks that contain milk.  2 hours before the procedure - stop drinking clear liquids. Medicines Ask your health care provider about:  Changing or stopping your regular  medicines. This is especially important if you are taking diabetes medicines or blood thinners.  Taking medicines such as aspirin and ibuprofen. These medicines can thin your blood. Do not take these medicines unless your health care provider tells you to take them.  Taking over-the-counter medicines, vitamins,  herbs, and supplements. General instructions  Do not use any products that contain nicotine or tobacco for at least 4 weeks before the procedure. These products include cigarettes, e-cigarettes, and chewing tobacco. If you need help quitting, ask your health care provider.  Ask your health care provider: ? How your surgical site will be marked or identified. ? What steps will be taken to help prevent infection. These may include:  Removing hair at the surgery site.  Washing skin with a germ-killing soap.  Taking antibiotic medicine.  Plan to have someone take you home from the hospital or clinic. What happens during the procedure?  An IV will be inserted into one of your veins.  You will be given one or more of the following: ? A medicine to help you relax (sedative). ? A medicine to make you fall asleep (general anesthetic). ? A medicine that is injected into your spine to numb the area below and slightly above the injection site (spinal anesthetic).  A tube (catheter) may be inserted through the outer opening of your bladder (urethra) to drain urine during and after surgery.  Depending on the type of vulvectomy you are having, your surgeon will make an incision and remove the affected area. This may include: ? Removing the entire vulva. ? Removing part of the vulva, surrounding deep tissue, and lymph nodes. ? Removing the vulva, clitoris, surrounding deep tissue, and lymph nodes.  If your lymph nodes are removed, a drain may be placed in the area to help avoid fluid buildup.  In some cases, skin from another area of the body (skin graft) may be removed and placed over the affected area to close the wound. Skin grafting may be needed if a large area of skin and tissue is removed. This may be done with a radical vulvectomy.  Your incisions will be closed and covered with a bandage (dressing). The procedure may vary among health care providers and hospitals. What happens after  the procedure?  Your blood pressure, heart rate, breathing rate, and blood oxygen level will be monitored until you leave the hospital or clinic.  You will get medicine for pain as needed.  You may get medicine to prevent constipation.  You may be on a liquid diet at first, and then switch to a regular diet.  When you are taking fluids well, your IV will be removed.  If your catheter was left in place after surgery, it will be removed when your health care provider approves.  You will be asked to breathe deeply and to get out of bed and walk as soon as you can.  You may have to wear compression stockings. These stockings help to prevent blood clots and reduce swelling in your legs. Summary  Vulvectomy is a surgical procedure to remove all or part of the outer female genital organs (vulva).  Depending on the type of vulvectomy you are having, deep tissue and lymph nodes in the area may also be removed.  Follow instructions from your health care provider about taking medicines and about eating and drinking before the procedure.  After the procedure, you will be asked to breathe deeply and to get out of bed and walk as soon  as you can. This information is not intended to replace advice given to you by your health care provider. Make sure you discuss any questions you have with your health care provider. Document Revised: 11/21/2018 Document Reviewed: 11/22/2018 Elsevier Patient Education  2021 Elsevier Inc.              IsabelPLEASE WEAR THE [place] UNTIL Sunday February 08, 2021. DO NOT USE ANY ADDITIONAL NUMBING MEDICATION WITHOUT CONSULTING A PHYSICIAN   Bupivacaine Liposomal Suspension for Injection What is this medicine? BUPIVACAINE LIPOSOMAL (bue PIV a kane LIP oh som al) is an anesthetic. It causes loss of feeling in the skin or other tissues. It is used to prevent and to treat pain from some procedures. This medicine may be used for other purposes; ask your  health care provider or pharmacist if you have questions. COMMON BRAND NAME(S): EXPAREL What should I tell my health care provider before I take this medicine? They need to know if you have any of these conditions:  G6PD deficiency  heart disease  kidney disease  liver disease  low blood pressure  lung or breathing disease, like asthma  an unusual or allergic reaction to bupivacaine, other medicines, foods, dyes, or preservatives  pregnant or trying to get pregnant  breast-feeding How should I use this medicine? This medicine is injected into the affected area. It is given by a health care provider in a hospital or clinic setting. Talk to your health care provider about the use of this medicine in children. While it may be given to children as young as 6 years for selected conditions, precautions do apply. Overdosage: If you think you have taken too much of this medicine contact a poison control center or emergency room at once. NOTE: This medicine is only for you. Do not share this medicine with others. What if I miss a dose? This does not apply. What may interact with this medicine? This medicine may interact with the following medications:  acetaminophen  certain antibiotics like dapsone, nitrofurantoin, aminosalicylic acid, sulfonamides  certain medicines for seizures like phenobarbital, phenytoin, valproic acid  chloroquine  cyclophosphamide  flutamide  hydroxyurea  ifosfamide  metoclopramide  nitric oxide  nitroglycerin  nitroprusside  nitrous oxide  other local anesthetics like lidocaine, pramoxine, tetracaine  primaquine  quinine  rasburicase  sulfasalazine This list may not describe all possible interactions. Give your health care provider a list of all the medicines, herbs, non-prescription drugs, or dietary supplements you use. Also tell them if you smoke, drink alcohol, or use illegal drugs. Some items may interact with your  medicine. What should I watch for while using this medicine? Your condition will be monitored carefully while you are receiving this medicine. Be careful to avoid injury while the area is numb, and you are not aware of pain. What side effects may I notice from receiving this medicine? Side effects that you should report to your doctor or health care professional as soon as possible:  allergic reactions like skin rash, itching or hives, swelling of the face, lips, or tongue  seizures  signs and symptoms of a dangerous change in heartbeat or heart rhythm like chest pain; dizziness; fast, irregular heartbeat; palpitations; feeling faint or lightheaded; falls; breathing problems  signs and symptoms of methemoglobinemia such as pale, gray, or blue colored skin; headache; fast heartbeat; shortness of breath; feeling faint or lightheaded, falls; tiredness Side effects that usually do not require medical attention (report to your doctor or health care  professional if they continue or are bothersome):  anxious  back pain  changes in taste  changes in vision  constipation  dizziness  fever  nausea, vomiting This list may not describe all possible side effects. Call your doctor for medical advice about side effects. You may report side effects to FDA at 1-800-FDA-1088. Where should I keep my medicine? This drug is given in a hospital or clinic and will not be stored at home. NOTE: This sheet is a summary. It may not cover all possible information. If you have questions about this medicine, talk to your doctor, pharmacist, or health care provider.  2021 Elsevier/Gold Standard (2020-01-31 12:24:57)

## 2021-02-04 NOTE — Telephone Encounter (Signed)
Patient called Stating that she just had surgery this morning and she feels like she is bleeding way to much and would like to know if this is normal. I informed patient that Dr. Despina Hidden is still at the hospital performing surgeries so it will take a little for him to respond back. Please contact pt

## 2021-02-04 NOTE — Transfer of Care (Signed)
Immediate Anesthesia Transfer of Care Note  Patient: Robin Bender  Procedure(s) Performed: VULVECTOMY PARTIAL (Left Vulva)  Patient Location: PACU  Anesthesia Type:General  Level of Consciousness: awake, alert  and oriented  Airway & Oxygen Therapy: Patient Spontanous Breathing and Patient connected to nasal cannula oxygen  Post-op Assessment: Report given to RN and Post -op Vital Bender reviewed and stable  Post vital Bender: Reviewed and stable  Last Vitals:  Vitals Value Taken Time  BP 97/69 02/04/21 0830  Temp    Pulse 83 02/04/21 0831  Resp 17 02/04/21 0831  SpO2 98 % 02/04/21 0831  Vitals shown include unvalidated device data.  Last Pain:  Vitals:   02/04/21 0645  TempSrc: Oral  PainSc: 0-No pain      Patients Stated Pain Goal: 8 (02/04/21 0645)  Complications: No complications documented.

## 2021-02-04 NOTE — Anesthesia Preprocedure Evaluation (Signed)
Anesthesia Evaluation  Patient identified by MRN, date of birth, ID band Patient awake    Reviewed: Allergy & Precautions, NPO status , Patient's Chart, lab work & pertinent test results  History of Anesthesia Complications Negative for: history of anesthetic complications  Airway Mallampati: II  TM Distance: >3 FB Neck ROM: Full    Dental  (+) Dental Advisory Given, Missing   Pulmonary sleep apnea , pneumonia, resolved, former smoker,    Pulmonary exam normal breath sounds clear to auscultation       Cardiovascular Exercise Tolerance: Good Normal cardiovascular exam Rhythm:Regular Rate:Normal     Neuro/Psych  Headaches, PSYCHIATRIC DISORDERS Anxiety Depression Bipolar Disorder  Neuromuscular disease    GI/Hepatic negative GI ROS, Neg liver ROS,   Endo/Other  Morbid obesity  Renal/GU negative Renal ROS     Musculoskeletal  (+) Arthritis , Fibromyalgia -  Abdominal   Peds  Hematology  (+) anemia ,   Anesthesia Other Findings   Reproductive/Obstetrics                           Anesthesia Physical Anesthesia Plan  ASA: III  Anesthesia Plan: General   Post-op Pain Management:    Induction: Intravenous  PONV Risk Score and Plan: 4 or greater and Ondansetron, Dexamethasone, Midazolam and Scopolamine patch - Pre-op  Airway Management Planned: Oral ETT  Additional Equipment:   Intra-op Plan:   Post-operative Plan: Extubation in OR  Informed Consent: I have reviewed the patients History and Physical, chart, labs and discussed the procedure including the risks, benefits and alternatives for the proposed anesthesia with the patient or authorized representative who has indicated his/her understanding and acceptance.     Dental advisory given  Plan Discussed with: CRNA and Surgeon  Anesthesia Plan Comments:         Anesthesia Quick Evaluation

## 2021-02-04 NOTE — Telephone Encounter (Signed)
Spoke with patient she states that she called the hospital and they talked to Dr. Despina Hidden and she didn't need anymore assistance right now.

## 2021-02-05 ENCOUNTER — Encounter (HOSPITAL_COMMUNITY): Payer: Self-pay | Admitting: Obstetrics & Gynecology

## 2021-02-05 LAB — SURGICAL PATHOLOGY

## 2021-02-10 ENCOUNTER — Telehealth: Payer: Self-pay | Admitting: Obstetrics & Gynecology

## 2021-02-10 NOTE — Telephone Encounter (Signed)
Pt still having pain & burning Wonders how normal that is  Please advise & notify pt

## 2021-02-12 ENCOUNTER — Other Ambulatory Visit: Payer: Self-pay

## 2021-02-12 ENCOUNTER — Encounter: Payer: Self-pay | Admitting: Obstetrics & Gynecology

## 2021-02-12 ENCOUNTER — Ambulatory Visit (INDEPENDENT_AMBULATORY_CARE_PROVIDER_SITE_OTHER): Payer: Medicaid Other | Admitting: Obstetrics & Gynecology

## 2021-02-12 VITALS — BP 119/84 | HR 92 | Ht 64.0 in | Wt 286.0 lb

## 2021-02-12 DIAGNOSIS — Z9889 Other specified postprocedural states: Secondary | ICD-10-CM

## 2021-02-12 NOTE — Progress Notes (Signed)
  HPI: Patient returns for routine postoperative follow-up having undergone partial vulvectomy on 02/04/21.  The patient's immediate postoperative recovery has been unremarkable. Since hospital discharge the patient reports no problems.   Current Outpatient Medications: atorvastatin (LIPITOR) 20 MG tablet, Take 20 mg by mouth daily., Disp: , Rfl:  Dulaglutide 0.75 MG/0.5ML SOPN, Inject 0.75 mg into the skin every 7 (seven) days. Trulicity, Disp: , Rfl:  estradiol (ESTRACE) 1 MG tablet, Take 1 tablet (1 mg total) by mouth daily., Disp: 30 tablet, Rfl: 11 HYDROcodone-acetaminophen (NORCO/VICODIN) 5-325 MG tablet, Take 1 tablet by mouth every 6 (six) hours as needed., Disp: 10 tablet, Rfl: 0 metFORMIN (GLUCOPHAGE-XR) 500 MG 24 hr tablet, Take 500 mg by mouth at bedtime., Disp: , Rfl:  ondansetron (ZOFRAN ODT) 8 MG disintegrating tablet, Take 1 tablet (8 mg total) by mouth every 8 (eight) hours as needed for nausea or vomiting., Disp: 8 tablet, Rfl: 0 vitamin B-12 (CYANOCOBALAMIN) 1000 MCG tablet, Take 1,000 mcg by mouth daily., Disp: , Rfl:  ketorolac (TORADOL) 10 MG tablet, Take 1 tablet (10 mg total) by mouth every 8 (eight) hours as needed. (Patient not taking: Reported on 02/12/2021), Disp: 15 tablet, Rfl: 0  No current facility-administered medications for this visit.    Blood pressure 119/84, pulse 92, height 5\' 4"  (1.626 m), weight 286 lb (129.7 kg).  Physical Exam: The area of left partil vulvectomy is healing well no evidence of infection or hematoma  Diagnostic Tests:   Pathology: Benign  Impression:   ICD-10-CM   1. S/P left partial vulvectomy 02/04/21  Z98.890       Plan:     Follow up: As needed   02/06/21, MD

## 2021-04-07 ENCOUNTER — Other Ambulatory Visit: Payer: Self-pay | Admitting: Cardiovascular Disease

## 2021-04-14 LAB — LIPID PANEL
Chol/HDL Ratio: 3.2 ratio (ref 0.0–4.4)
Cholesterol, Total: 161 mg/dL (ref 100–199)
HDL: 50 mg/dL (ref >39)
LDL Chol Calc (NIH): 83 mg/dL (ref 0–99)
Triglycerides: 166 mg/dL — ABNORMAL HIGH (ref 0–149)
VLDL Cholesterol Cal: 28 mg/dL (ref 5–40)

## 2021-04-14 NOTE — Addendum Note (Signed)
Addended by: Alyson Ingles on: 04/14/2021 08:56 AM   Modules accepted: Orders

## 2021-04-15 ENCOUNTER — Other Ambulatory Visit: Payer: Self-pay

## 2021-04-15 DIAGNOSIS — E782 Mixed hyperlipidemia: Secondary | ICD-10-CM

## 2021-04-15 DIAGNOSIS — Z79899 Other long term (current) drug therapy: Secondary | ICD-10-CM

## 2021-04-15 MED ORDER — ICOSAPENT ETHYL 1 G PO CAPS: 2.0000 g | ORAL_CAPSULE | Freq: Two times a day (BID) | ORAL | 7 refills | Status: DC

## 2021-04-16 ENCOUNTER — Telehealth: Payer: Self-pay

## 2021-04-16 NOTE — Telephone Encounter (Signed)
Pt will purchase OTC omega-3 fish oil 2gBID.

## 2021-04-16 NOTE — Telephone Encounter (Signed)
Received denial of Prior Auth from cover my meds. What would you like to rx at this time? OTC fish oil? Mg? Please advise

## 2021-04-16 NOTE — Telephone Encounter (Signed)
She may take over-the-counter omega-3, 2 g twice daily.  Thank you.

## 2021-04-20 ENCOUNTER — Other Ambulatory Visit: Payer: Self-pay | Admitting: Family Medicine

## 2021-04-20 DIAGNOSIS — K429 Umbilical hernia without obstruction or gangrene: Secondary | ICD-10-CM

## 2021-04-24 ENCOUNTER — Ambulatory Visit (INDEPENDENT_AMBULATORY_CARE_PROVIDER_SITE_OTHER): Payer: Medicaid Other | Admitting: Podiatry

## 2021-04-24 ENCOUNTER — Other Ambulatory Visit: Payer: Self-pay

## 2021-04-24 ENCOUNTER — Ambulatory Visit (INDEPENDENT_AMBULATORY_CARE_PROVIDER_SITE_OTHER): Payer: Medicaid Other

## 2021-04-24 DIAGNOSIS — M722 Plantar fascial fibromatosis: Secondary | ICD-10-CM

## 2021-04-29 ENCOUNTER — Encounter: Payer: Self-pay | Admitting: Podiatry

## 2021-04-29 NOTE — Progress Notes (Signed)
Subjective:  Patient ID: Robin Bender, female    DOB: 1976/09/13,  MRN: [id number]  Chief Complaint  Patient presents with   Foot Pain    Right foot pain for 4-5 months pt stated that it feels like her heel is on fire and she has a constant burning sensation the pain is worse when pressure is applied     45 y.o. female presents with the above complaint.  Patient presents with right heel pain that has been going for 4 to 5 months has progressive gotten worse.  Patient states the heel is on fire constant burning sensation.  Pain is worse when Applying pressure.  She states that she would like to discuss treatment options for it.  She does smoke.  She has not tried any treatment options.  She does constantly work on lateral foot.  Post static dyskinesia type of symptoms.  Pain scale is 8 out of 10.  Sharp shooting in nature   Review of Systems: Negative except as noted in the HPI. Denies N/V/F/Ch.  Past Medical History:  Diagnosis Date   Anemia    Anxiety    Arthritis    Bipolar disorder (HCC)    Depression    Fatigue    Fibromyalgia    Headache    Pneumonia due to COVID-19 virus 07/30/2020   [facility]   PTSD (post-traumatic stress disorder)    Sleep apnea     Current Outpatient Medications:    atorvastatin (LIPITOR) 20 MG tablet, TAKE ONE (1) TABLET EACH DAY, Disp: 90 tablet, Rfl: 2   Dulaglutide 0.75 MG/0.5ML SOPN, Inject 0.75 mg into the skin every 7 (seven) days. Trulicity, Disp: , Rfl:    estradiol (ESTRACE) 1 MG tablet, Take 1 tablet (1 mg total) by mouth daily., Disp: 30 tablet, Rfl: 11   HYDROcodone-acetaminophen (NORCO/VICODIN) 5-325 MG tablet, Take 1 tablet by mouth every 6 (six) hours as needed., Disp: 10 tablet, Rfl: 0   icosapent Ethyl (VASCEPA) 1 g capsule, Take 2 capsules (2 g total) by mouth 2 (two) times daily., Disp: 120 capsule, Rfl: 7   ketorolac (TORADOL) 10 MG tablet, Take 1 tablet (10 mg total) by mouth every 8 (eight) hours as needed. (Patient not  taking: Reported on 02/12/2021), Disp: 15 tablet, Rfl: 0   metFORMIN (GLUCOPHAGE-XR) 500 MG 24 hr tablet, Take 500 mg by mouth at bedtime., Disp: , Rfl:    ondansetron (ZOFRAN ODT) 8 MG disintegrating tablet, Take 1 tablet (8 mg total) by mouth every 8 (eight) hours as needed for nausea or vomiting., Disp: 8 tablet, Rfl: 0   vitamin B-12 (CYANOCOBALAMIN) 1000 MCG tablet, Take 1,000 mcg by mouth daily., Disp: , Rfl:   Social History   Tobacco Use  Smoking Status Former   Packs/day: 0.00   Years: 22.00   Pack years: 0.00   Types: Cigarettes  Smokeless Tobacco Never  Tobacco Comments   reduce the # of cigarettes     Allergies  Allergen Reactions   Codeine Other (See Comments)    Blisters around mouth   Demerol Hives   Ampicillin-Sulbactam Sodium Rash    Rash, redness noted around iv site,    Objective:  There were no vitals filed for this visit. There is no height or weight on file to calculate BMI. Constitutional Well developed. Well nourished.  Vascular Dorsalis pedis pulses palpable bilaterally. Posterior tibial pulses palpable bilaterally. Capillary refill normal to all digits.  No cyanosis or clubbing noted. Pedal hair growth normal.  Neurologic Normal speech. Oriented to person, place, and time. Epicritic sensation to light touch grossly present bilaterally.  Dermatologic Nails well groomed and normal in appearance. No open wounds. No skin lesions.  Orthopedic: Normal joint ROM without pain or crepitus bilaterally. No visible deformities. Tender to palpation at the calcaneal tuber right. No pain with calcaneal squeeze right. Ankle ROM diminished range of motion right. Silfverskiold Test: positive right.   Radiographs: Taken and reviewed. No acute fractures or dislocations. No evidence of stress fracture.  Plantar heel spur present. Posterior heel spur present.  Bullet hole sinus tarsi noted.  Assessment:   1. Plantar fasciitis of right foot    Plan:  Patient  was evaluated and treated and all questions answered.  Plantar Fasciitis, right - XR reviewed as above.  - Educated on icing and stretching. Instructions given.  - Injection delivered to the plantar fascia as below. - DME: Plantar Fascial Brace - Pharmacologic management: None  Procedure: Injection Tendon/Ligament Location: Right plantar fascia at the glabrous junction; medial approach. Skin Prep: alcohol Injectate: 0.5 cc 0.5% marcaine plain, 0.5 cc of 1% Lidocaine, 0.5 cc kenalog 10. Disposition: Patient tolerated procedure well. Injection site dressed with a band-aid.  No follow-ups on file.

## 2021-05-05 ENCOUNTER — Other Ambulatory Visit: Payer: Self-pay

## 2021-05-05 ENCOUNTER — Ambulatory Visit (INDEPENDENT_AMBULATORY_CARE_PROVIDER_SITE_OTHER): Payer: Medicaid Other | Admitting: General Surgery

## 2021-05-05 ENCOUNTER — Encounter: Payer: Self-pay | Admitting: General Surgery

## 2021-05-05 VITALS — BP 119/85 | HR 97 | Temp 98.3°F | Ht 64.0 in | Wt 299.8 lb

## 2021-05-05 DIAGNOSIS — K429 Umbilical hernia without obstruction or gangrene: Secondary | ICD-10-CM | POA: Insufficient documentation

## 2021-05-05 NOTE — Progress Notes (Signed)
[facility] History and Physical  Reason for Referral: Umbilical hernia  Referring Physician: Jonathan Berry, MD  Chief Complaint   New Patient (Initial Visit)     Robin Bender is a 45 y.o. female.  HPI:  [name] is a 45 yo with fibromyalgia, PTSD, and an umbilical hernia that she has noted for a while. The area has been getting better and is inside her umbilicus and sticks out more at times. It is painful when it sticks out and also causes her discomfort when she is active or moving against the counter in the kitchen  She has a history of constipation at baseline. She is on chronic pain medication and is on pain contract Dr. Bartko. She denies any obstructive type symptoms and has never had the hernia get stuck out or hard.   Past Medical History:  Diagnosis Date   Anemia    Anxiety    Arthritis    Bipolar disorder (HCC)    Depression    Fatigue    Fibromyalgia    Headache    Pneumonia due to COVID-19 virus 07/30/2020   [facility]   PTSD (post-traumatic stress disorder)    Sleep apnea     Past Surgical History:  Procedure Laterality Date   ABDOMINAL HYSTERECTOMY     APPENDECTOMY     CARPAL TUNNEL RELEASE     CESAREAN SECTION  2002, 2004   DE QUERVAIN'S RELEASE Left 07/03/2020   DORSAL COMPARTMENT RELEASE Right 09/01/2020   Procedure: RIGHT 1ST DORSAL COMPARTMENT RELEASE;  Surgeon: Yates, Mark C, MD;  Location: [facility];  Service: Orthopedics;  Laterality: Right;   LAPAROSCOPIC APPENDECTOMY N/A 05/19/2014   Procedure: APPENDECTOMY LAPAROSCOPIC;  Surgeon: Mark A Jenkins, MD;  Location: [facility];  Service: General;  Laterality: N/A;   ovarian cyst removed     VULVECTOMY PARTIAL Left 02/04/2021   Procedure: VULVECTOMY PARTIAL;  Surgeon: Eure, Luther H, MD;  Location: [facility];  Service: Gynecology;  Laterality: Left;    Family History  Problem Relation Age of Onset   Cancer Mother        cervical   Hypertension Mother    Heart  disease Mother    Arthritis/Rheumatoid Mother     Social History   Tobacco Use   Smoking status: Former    Packs/day: 0.00    Years: 22.00    Pack years: 0.00    Types: Cigarettes   Smokeless tobacco: Never   Tobacco comments:    reduce the # of cigarettes   Vaping Use   Vaping Use: Never used  Substance Use Topics   Alcohol use: No   Drug use: No    Medications: I have reviewed the patient's current medications. Allergies as of 05/05/2021       Reactions   Codeine Other (See Comments)   Blisters around mouth   Demerol Hives   Ampicillin-sulbactam Sodium Rash   Rash, redness noted around iv site,         Medication List        Accurate as of May 05, 2021  9:32 AM. If you have any questions, ask your nurse or doctor.          STOP taking these medications    icosapent Ethyl 1 g capsule Commonly known as: Vascepa Stopped by: Markail Diekman C Roise Emert, MD   ketorolac 10 MG tablet Commonly known as: TORADOL Stopped by: Flannery Cavallero C Tamu Golz, MD   ondansetron 8 MG disintegrating tablet   Commonly known as: Zofran ODT Stopped by: Wing Gfeller C Harlo Fabela, MD       TAKE these medications    atorvastatin 20 MG tablet Commonly known as: LIPITOR TAKE ONE (1) TABLET EACH DAY   Dulaglutide 0.75 MG/0.5ML Sopn Inject 0.75 mg into the skin every 7 (seven) days. Trulicity   estradiol 1 MG tablet Commonly known as: ESTRACE Take 1 tablet (1 mg total) by mouth daily.   HYDROcodone-acetaminophen 5-325 MG tablet Commonly known as: NORCO/VICODIN Take 1 tablet by mouth every 6 (six) hours as needed.   metFORMIN 500 MG 24 hr tablet Commonly known as: GLUCOPHAGE-XR Take 500 mg by mouth at bedtime.   vitamin B-12 1000 MCG tablet Commonly known as: CYANOCOBALAMIN Take 1,000 mcg by mouth daily.         ROS:  A comprehensive review of systems was negative except for: Respiratory: positive for wheezing Gastrointestinal: positive for abdominal pain Musculoskeletal: positive  for back pain, neck pain, and joint pain  Blood pressure 119/85, pulse 97, temperature 98.3 F (36.8 C), temperature source Oral, height 5' 4" (1.626 m), weight 299 lb 12.8 oz (136 kg), SpO2 94 %. Physical Exam Vitals reviewed.  Constitutional:      Appearance: She is obese.  HENT:     Head: Normocephalic.     Nose: Nose normal.     Mouth/Throat:     Mouth: Mucous membranes are moist.  Eyes:     Extraocular Movements: Extraocular movements intact.  Cardiovascular:     Rate and Rhythm: Normal rate and regular rhythm.  Pulmonary:     Effort: Pulmonary effort is normal.     Breath sounds: Normal breath sounds.  Abdominal:     General: There is no distension.     Palpations: Abdomen is soft.     Tenderness: There is abdominal tenderness in the periumbilical area.     Hernia: A hernia is present. Hernia is present in the umbilical area.     Comments: reducible  Musculoskeletal:        General: Normal range of motion.     Cervical back: Normal range of motion.  Skin:    General: Skin is warm.  Neurological:     General: No focal deficit present.     Mental Status: She is alert and oriented to person, place, and time.  Psychiatric:        Mood and Affect: Mood normal.        Behavior: Behavior normal.        Thought Content: Thought content normal.        Judgment: Judgment normal.    Results: CT a/p 2015- umbilical hernia with fat then, during appendicitis exam    Assessment & Plan:  Robin Bender is a 45 y.o. female with umbilical hernia. Discussed hernia repair with mesh. Risk of recurrence, bleeding, use of mesh, injury to bowel, and need for post operative limitations. Discussed risk of recurrence higher with obesity, DM and smoking.   She works at home.   All questions were answered to the satisfaction of the patient.    Isiah Scheel C Collan Schoenfeld 05/05/2021, 9:32 AM   

## 2021-05-05 NOTE — Patient Instructions (Signed)
Open Hernia Repair, Adult Open hernia repair is a surgical procedure to fix a hernia. A hernia occurs when an internal organ or tissue pushes through a weak spot in the muscles along the wall of the abdomen. Hernias commonly occur in the groin and aroundthe belly button. Most hernias tend to get worse over time. Often, surgery is done to prevent the hernia from becoming bigger, uncomfortable, or an emergency. Emergency surgery may be needed if contents of the abdomen get stuck in the opening (incarcerated hernia) or if the blood supply gets cut off (strangulated hernia). In an open repair, an incision is made in the abdomen to perform the surgery. Tell a health care provider about: Any allergies you have. All medicines you are taking, including vitamins, herbs, eye drops, creams, and over-the-counter medicines. Any problems you or family members have had with anesthetic medicines. Any blood or bone disorders you have. Any surgeries you have had. Any medical conditions you have, including any recent cold or flu (influenza)symptoms. Whether you are pregnant or may be pregnant. What are the risks? Generally, this is a safe procedure. However, problems may occur, including: Long-lasting (chronic) pain. Bleeding. Infection. Damage to the testicles. This can cause shrinking or swelling. Damage to nearby structures or organs, including the bladder, blood vessels, intestines, or nerves near the hernia. Blood clots. Trouble passing urine. Return of the hernia. Medicines Ask your health care provider about: Changing or stopping your regular medicines. This is especially important if you are taking diabetes medicines or blood thinners. Taking medicines such as aspirin and ibuprofen. These medicines can thin your blood. Do not take these medicines unless your health care provider tells you to take them. Taking over-the-counter medicines, vitamins, herbs, and supplements. Surgery safety Ask your health  care provider: How your surgery site will be marked. What steps will be taken to help prevent infection. These steps may include: Removing hair at the surgery site. Washing skin with a germ-killing soap. Receiving antibiotic medicine. General instructions You may have an exam or testing, such as blood tests or imaging studies. Do not use any products that contain nicotine or tobacco for at least 4 weeks before the procedure. These products include cigarettes, chewing tobacco, and vaping devices, such as e-cigarettes. If you need help quitting, ask your health care provider. Let your health care provider know if you develop a cold or any infection before your surgery. If you get an infection before surgery, you may receive antibiotics to treat it. Plan to have a responsible adult take you home from the hospital or clinic. If you will be going home right after the procedure, plan to have a responsible adult care for you for the time you are told. This is important. What happens during the procedure?  An IV will be inserted into one of your veins. You will be given one or more of the following: A medicine to help you relax (sedative). A medicine to numb the area (local anesthetic). A medicine to make you fall asleep (general anesthetic). Your surgeon will make an incision over the hernia. The tissues of the hernia will be moved back into place. The edges of the hernia may be stitched (sutured) together. The opening in the abdominal muscles will be closed with stitches (sutures). Or, your surgeon will place a mesh patch made of artificial (synthetic) material over the opening. The incision will be closed with sutures, skin glue, or adhesive strips. A bandage (dressing) may be placed over the incision. The procedure  may vary among health care providers and hospitals. What happens after the procedure? Your blood pressure, heart rate, breathing rate, and blood oxygen level will be monitored until  you leave the hospital or clinic. You may be given medicine for pain. If you were given a sedative during the procedure, it can affect you for several hours. Do not drive or operate machinery until your health care provider says that it is safe. Summary Open hernia repair is a surgical procedure to fix a hernia. Hernias commonly occur in the groin and around the belly button. Emergency surgery may be needed if contents of the abdomen get stuck in the opening (incarcerated hernia) or if the blood supply gets cut off (strangulated hernia). In this procedure, an incision is made in the abdomen to perform the surgery. After the procedure, you may be given medicine for pain. This information is not intended to replace advice given to you by your health care provider. Make sure you discuss any questions you have with your healthcare provider. Document Revised: 06/09/2020 Document Reviewed: 06/09/2020 Elsevier Patient Education  2022 [employer].

## 2021-05-06 NOTE — H&P (Signed)
[facility] History and Physical  Reason for Referral: Umbilical hernia  Referring Physician: Nanetta Batty, MD  Chief Complaint   New Patient (Initial Visit)     Robin Bender is a 45 y.o. female.  HPI:  Robin Bender is a 45 yo with fibromyalgia, PTSD, and an umbilical hernia that she has noted for a while. The area has been getting better and is inside her umbilicus and sticks out more at times. It is painful when it sticks out and also causes her discomfort when she is active or moving against the counter in the kitchen  She has a history of constipation at baseline. She is on chronic pain medication and is on pain contract Dr. Murray Hodgkins. She denies any obstructive type symptoms and has never had the hernia get stuck out or hard.   Past Medical History:  Diagnosis Date   Anemia    Anxiety    Arthritis    Bipolar disorder (HCC)    Depression    Fatigue    Fibromyalgia    Headache    Pneumonia due to COVID-19 virus 07/30/2020   [facility]   PTSD (post-traumatic stress disorder)    Sleep apnea     Past Surgical History:  Procedure Laterality Date   ABDOMINAL HYSTERECTOMY     APPENDECTOMY     CARPAL TUNNEL RELEASE     CESAREAN SECTION  2002, 2004   DE QUERVAIN'S RELEASE Left 07/03/2020   DORSAL COMPARTMENT RELEASE Right 09/01/2020   Procedure: RIGHT 1ST DORSAL COMPARTMENT RELEASE;  Surgeon: Eldred Manges, MD;  Location: [facility];  Service: Orthopedics;  Laterality: Right;   LAPAROSCOPIC APPENDECTOMY N/A 05/19/2014   Procedure: APPENDECTOMY LAPAROSCOPIC;  Surgeon: Dalia Heading, MD;  Location: [facility];  Service: General;  Laterality: N/A;   ovarian cyst removed     VULVECTOMY PARTIAL Left 02/04/2021   Procedure: VULVECTOMY PARTIAL;  Surgeon: Lazaro Arms, MD;  Location: [facility];  Service: Gynecology;  Laterality: Left;    Family History  Problem Relation Age of Onset   Cancer Mother        cervical   Hypertension Mother    Heart  disease Mother    Arthritis/Rheumatoid Mother     Social History   Tobacco Use   Smoking status: Former    Packs/day: 0.00    Years: 22.00    Pack years: 0.00    Types: Cigarettes   Smokeless tobacco: Never   Tobacco comments:    reduce the # of cigarettes   Vaping Use   Vaping Use: Never used  Substance Use Topics   Alcohol use: No   Drug use: No    Medications: I have reviewed the patient's current medications. Allergies as of 05/05/2021       Reactions   Codeine Other (See Comments)   Blisters around mouth   Demerol Hives   Ampicillin-sulbactam Sodium Rash   Rash, redness noted around iv site,         Medication List        Accurate as of May 05, 2021  9:32 AM. If you have any questions, ask your nurse or doctor.          STOP taking these medications    icosapent Ethyl 1 g capsule Commonly known as: Vascepa Stopped by: Lucretia Roers, MD   ketorolac 10 MG tablet Commonly known as: TORADOL Stopped by: Lucretia Roers, MD   ondansetron 8 MG disintegrating tablet  Commonly known as: Zofran ODT Stopped by: Lucretia Roers, MD       TAKE these medications    atorvastatin 20 MG tablet Commonly known as: LIPITOR TAKE ONE (1) TABLET EACH DAY   Dulaglutide 0.75 MG/0.5ML Sopn Inject 0.75 mg into the skin every 7 (seven) days. Trulicity   estradiol 1 MG tablet Commonly known as: ESTRACE Take 1 tablet (1 mg total) by mouth daily.   HYDROcodone-acetaminophen 5-325 MG tablet Commonly known as: NORCO/VICODIN Take 1 tablet by mouth every 6 (six) hours as needed.   metFORMIN 500 MG 24 hr tablet Commonly known as: GLUCOPHAGE-XR Take 500 mg by mouth at bedtime.   vitamin B-12 1000 MCG tablet Commonly known as: CYANOCOBALAMIN Take 1,000 mcg by mouth daily.         ROS:  A comprehensive review of systems was negative except for: Respiratory: positive for wheezing Gastrointestinal: positive for abdominal pain Musculoskeletal: positive  for back pain, neck pain, and joint pain  Blood pressure 119/85, pulse 97, temperature 98.3 F (36.8 C), temperature source Oral, height 5\' 4"  (1.626 m), weight 299 lb 12.8 oz (136 kg), SpO2 94 %. Physical Exam Vitals reviewed.  Constitutional:      Appearance: She is obese.  HENT:     Head: Normocephalic.     Nose: Nose normal.     Mouth/Throat:     Mouth: Mucous membranes are moist.  Eyes:     Extraocular Movements: Extraocular movements intact.  Cardiovascular:     Rate and Rhythm: Normal rate and regular rhythm.  Pulmonary:     Effort: Pulmonary effort is normal.     Breath sounds: Normal breath sounds.  Abdominal:     General: There is no distension.     Palpations: Abdomen is soft.     Tenderness: There is abdominal tenderness in the periumbilical area.     Hernia: A hernia is present. Hernia is present in the umbilical area.     Comments: reducible  Musculoskeletal:        General: Normal range of motion.     Cervical back: Normal range of motion.  Skin:    General: Skin is warm.  Neurological:     General: No focal deficit present.     Mental Status: She is alert and oriented to person, place, and time.  Psychiatric:        Mood and Affect: Mood normal.        Behavior: Behavior normal.        Thought Content: Thought content normal.        Judgment: Judgment normal.    Results: CT a/p 2015- umbilical hernia with fat then, during appendicitis exam    Assessment & Plan:  SHELBEY Bender is a 45 y.o. female with umbilical hernia. Discussed hernia repair with mesh. Risk of recurrence, bleeding, use of mesh, injury to bowel, and need for post operative limitations. Discussed risk of recurrence higher with obesity, DM and smoking.   She works at home.   All questions were answered to the satisfaction of the patient.    55 05/05/2021, 9:32 AM

## 2021-05-19 NOTE — Patient Instructions (Signed)
Robin Bender  05/19/2021     @   Your procedure is scheduled on   05/22/2021.   Report to [facility] at   0715 A.M.   Call this number if you have problems the morning of surgery:  [phone]   Remember:  Do not eat or drink after midnight.  DO NOT take any medications for diabetes the morning of your procedure.      Take these medicines the morning of surgery with A SIP OF WATER      hydrocodone (If needed).   Use your inhaler before you come and bring your rescue inhaler with you.     Do not wear jewelry, make-up or nail polish.  Do not wear lotions, powders, or perfumes, or deodorant.  Do not shave 48 hours prior to surgery.  Men may shave face and neck.  Do not bring valuables to the hospital.  [facility] is not responsible for any belongings or valuables.  Contacts, dentures or bridgework may not be worn into surgery.  Leave your suitcase in the car.  After surgery it may be brought to your room.  For patients admitted to the hospital, discharge time will be determined by your treatment team.  Patients discharged the day of surgery will not be allowed to drive home and must have someone with them for 24 hours.    Special instructions:    DO NOT smoke tobacco or vape for 24 hours before your procedure.  Please read over the following fact sheets that you were given. Coughing and Deep Breathing, Surgical Site Infection Prevention, Anesthesia Post-op Instructions, and Care and Recovery After Surgery      Open Hernia Repair, Adult, Care After What can I expect after the procedure? After the procedure, it is common to have: Mild discomfort. Slight bruising. Mild swelling. Pain in the belly (abdomen). A small amount of blood from the cut from surgery (incision). Follow these instructions at home: Your doctor may give you more specific instructions. If you have problems, callyour doctor. Medicines Take over-the-counter and  prescription medicines only as told by your doctor. If told, take steps to prevent problems with pooping (constipation). You may need to: Drink enough fluid to keep your pee (urine) pale yellow. Take medicines. You will be told what medicines to take. Eat foods that are high in fiber. These include beans, whole grains, and fresh fruits and vegetables. Limit foods that are high in fat and sugar. These include fried or sweet foods. Ask your doctor if you should avoid driving or using machines while you are taking your medicine. Incision care  Follow instructions from your doctor about how to take care of your incision. Make sure you: Wash your hands with soap and water for at least 20 seconds before and after you change your bandage (dressing). If you cannot use soap and water, use hand sanitizer. Change your bandage. Leave stitches or skin glue in place for at least 2 weeks. Leave tape strips alone unless you are told to take them off. You may trim the edges of the tape strips if they curl up. Check your incision every day for signs of infection. Check for: More redness, swelling, or pain. More fluid or blood. Warmth. Pus or a bad smell. Wear loose, soft clothing while your incision heals.  Activity  Rest as told by your doctor. Do not lift anything that is heavier than 10 lb (4.5 kg), or the  limit that you are told. Do not play contact sports until your doctor says that this is safe. If you were given a sedative during your procedure, do not drive or use machines until your doctor says that it is safe. A sedative is a medicine that helps you relax. Return to your normal activities when your doctor says that it is safe.  General instructions Do not take baths, swim, or use a hot tub. Ask your doctor about taking showers or sponge baths. Hold a pillow over your belly when you cough or sneeze. This helps with pain. Do not smoke or use any products that contain nicotine or tobacco. If you  need help quitting, ask your doctor. Keep all follow-up visits. Contact a doctor if: You have any of these signs of infection in or around your incision: More redness, swelling, or pain. More fluid or blood. Warmth. Pus. A bad smell. You have a fever or chills. You have blood in your poop (stool). You have not pooped (had a bowel movement) in 2-3 days. Medicine does not help your pain. Get help right away if: You have chest pain, or you are short of breath. You feel faint or light-headed. You have very bad pain. You vomit and your pain is worse. You have pain, swelling, or redness in a leg. These symptoms may be an emergency. Get help right away. Call your local emergency services (911 in the [place] Do not wait to see if the symptoms will go away. Do not drive yourself to the hospital. Summary After this procedure, it is common to have mild discomfort, slight bruising, and mild swelling. Follow instructions from your doctor about how to take care of your cut from surgery (incision). Check every day for signs of infection. Do not lift heavy objects or play contact sports until your doctor says it is safe. Return to your normal activities as told by your doctor. This information is not intended to replace advice given to you by your health care provider. Make sure you discuss any questions you have with your healthcare provider. Document Revised: 06/09/2020 Document Reviewed: 06/09/2020 Elsevier Patient Education  2022 [employer] General Anesthesia, Adult, Care After This sheet gives you information about how to care for yourself after your procedure. Your health care provider may also give you more specific instructions. If you have problems or questions, contact your health careprovider. What can I expect after the procedure? After the procedure, the following side effects are common: Pain or discomfort at the IV site. Nausea. Vomiting. Sore throat. Trouble  concentrating. Feeling cold or chills. Feeling weak or tired. Sleepiness and fatigue. Soreness and body aches. These side effects can affect parts of the body that were not involved in surgery. Follow these instructions at home: For the time period you were told by your health care provider:  Rest. Do not participate in activities where you could fall or become injured. Do not drive or use machinery. Do not drink alcohol. Do not take sleeping pills or medicines that cause drowsiness. Do not make important decisions or sign legal documents. Do not take care of children on your own.  Eating and drinking Follow any instructions from your health care provider about eating or drinking restrictions. When you feel hungry, start by eating small amounts of foods that are soft and easy to digest (bland), such as toast. Gradually return to your regular diet. Drink enough fluid to keep your urine pale yellow. If you vomit, rehydrate by drinking water,  juice, or clear broth. General instructions If you have sleep apnea, surgery and certain medicines can increase your risk for breathing problems. Follow instructions from your health care provider about wearing your sleep device: Anytime you are sleeping, including during daytime naps. While taking prescription pain medicines, sleeping medicines, or medicines that make you drowsy. Have a responsible adult stay with you for the time you are told. It is important to have someone help care for you until you are awake and alert. Return to your normal activities as told by your health care provider. Ask your health care provider what activities are safe for you. Take over-the-counter and prescription medicines only as told by your health care provider. If you smoke, do not smoke without supervision. Keep all follow-up visits as told by your health care provider. This is important. Contact a health care provider if: You have nausea or vomiting that does  not get better with medicine. You cannot eat or drink without vomiting. You have pain that does not get better with medicine. You are unable to pass urine. You develop a skin rash. You have a fever. You have redness around your IV site that gets worse. Get help right away if: You have difficulty breathing. You have chest pain. You have blood in your urine or stool, or you vomit blood. Summary After the procedure, it is common to have a sore throat or nausea. It is also common to feel tired. Have a responsible adult stay with you for the time you are told. It is important to have someone help care for you until you are awake and alert. When you feel hungry, start by eating small amounts of foods that are soft and easy to digest (bland), such as toast. Gradually return to your regular diet. Drink enough fluid to keep your urine pale yellow. Return to your normal activities as told by your health care provider. Ask your health care provider what activities are safe for you. This information is not intended to replace advice given to you by your health care provider. Make sure you discuss any questions you have with your healthcare provider. Document Revised: 07/10/2020 Document Reviewed: 02/07/2020 Elsevier Patient Education  2022 [employer] Inc. How to Use Chlorhexidine for Bathing Chlorhexidine gluconate (CHG) is a germ-killing (antiseptic) solution that is used to clean the skin. It can get rid of the bacteria that normally live on the skin and can keep them away for about 24 hours. To clean your skin with CHG, you may be given: A CHG solution to use in the shower or as part of a sponge bath. A prepackaged cloth that contains CHG. Cleaning your skin with CHG may help lower the risk for infection: While you are staying in the intensive care unit of the hospital. If you have a vascular access, such as a central line, to provide short-term or long-term access to your veins. If you have a  catheter to drain urine from your bladder. If you are on a ventilator. A ventilator is a machine that helps you breathe by moving air in and out of your lungs. After surgery. What are the risks? Risks of using CHG include: A skin reaction. Hearing loss, if CHG gets in your ears. Eye injury, if CHG gets in your eyes and is not rinsed out. The CHG product catching fire. Make sure that you avoid smoking and flames after applying CHG to your skin. Do not use CHG: If you have a chlorhexidine allergy or have previously  reacted to chlorhexidine. On babies younger than 442 months of age. How to use CHG solution Use CHG only as told by your health care provider, and follow the instructions on the label. Use the full amount of CHG as directed. Usually, this is one bottle. During a shower Follow these steps when using CHG solution during a shower (unless your health care provider gives you different instructions): Start the shower. Use your normal soap and shampoo to wash your face and hair. Turn off the shower or move out of the shower stream. Pour the CHG onto a clean washcloth. Do not use any type of brush or rough-edged sponge. Starting at your neck, lather your body down to your toes. Make sure you follow these instructions: If you will be having surgery, pay special attention to the part of your body where you will be having surgery. Scrub this area for at least 1 minute. Do not use CHG on your head or face. If the solution gets into your ears or eyes, rinse them well with water. Avoid your genital area. Avoid any areas of skin that have broken skin, cuts, or scrapes. Scrub your back and under your arms. Make sure to wash skin folds. Let the lather sit on your skin for 1-2 minutes or as long as told by your health care provider. Thoroughly rinse your entire body in the shower. Make sure that all body creases and crevices are rinsed well. Dry off with a clean towel. Do not put any substances on  your body afterward--such as powder, lotion, or perfume--unless you are told to do so by your health care provider. Only use lotions that are recommended by the manufacturer. Put on clean clothes or pajamas. If it is the night before your surgery, sleep in clean sheets.  During a sponge bath Follow these steps when using CHG solution during a sponge bath (unless your health care provider gives you different instructions): Use your normal soap and shampoo to wash your face and hair. Pour the CHG onto a clean washcloth. Starting at your neck, lather your body down to your toes. Make sure you follow these instructions: If you will be having surgery, pay special attention to the part of your body where you will be having surgery. Scrub this area for at least 1 minute. Do not use CHG on your head or face. If the solution gets into your ears or eyes, rinse them well with water. Avoid your genital area. Avoid any areas of skin that have broken skin, cuts, or scrapes. Scrub your back and under your arms. Make sure to wash skin folds. Let the lather sit on your skin for 1-2 minutes or as long as told by your health care provider. Using a different clean, wet washcloth, thoroughly rinse your entire body. Make sure that all body creases and crevices are rinsed well. Dry off with a clean towel. Do not put any substances on your body afterward--such as powder, lotion, or perfume--unless you are told to do so by your health care provider. Only use lotions that are recommended by the manufacturer. Put on clean clothes or pajamas. If it is the night before your surgery, sleep in clean sheets. How to use CHG prepackaged cloths Only use CHG cloths as told by your health care provider, and follow the instructions on the label. Use the CHG cloth on clean, dry skin. Do not use the CHG cloth on your head or face unless your health care provider tells you to.  When washing with the CHG cloth: Avoid your genital  area. Avoid any areas of skin that have broken skin, cuts, or scrapes. Before surgery Follow these steps when using a CHG cloth to clean before surgery (unless your health care provider gives you different instructions): Using the CHG cloth, vigorously scrub the part of your body where you will be having surgery. Scrub using a back-and-forth motion for 3 minutes. The area on your body should be completely wet with CHG when you are done scrubbing. Do not rinse. Discard the cloth and let the area air-dry. Do not put any substances on the area afterward, such as powder, lotion, or perfume. Put on clean clothes or pajamas. If it is the night before your surgery, sleep in clean sheets.  For general bathing Follow these steps when using CHG cloths for general bathing (unless your health care provider gives you different instructions). Use a separate CHG cloth for each area of your body. Make sure you wash between any folds of skin and between your fingers and toes. Wash your body in the following order, switching to a new cloth after each step: The front of your neck, shoulders, and chest. Both of your arms, under your arms, and your hands. Your stomach and groin area, avoiding the genitals. Your right leg and foot. Your left leg and foot. The back of your neck, your back, and your buttocks. Do not rinse. Discard the cloth and let the area air-dry. Do not put any substances on your body afterward--such as powder, lotion, or perfume--unless you are told to do so by your health care provider. Only use lotions that are recommended by the manufacturer. Put on clean clothes or pajamas. Contact a health care provider if: Your skin gets irritated after scrubbing. You have questions about using your solution or cloth. Get help right away if: Your eyes become very red or swollen. Your eyes itch badly. Your skin itches badly and is red or swollen. Your hearing changes. You have trouble seeing. You have  swelling or tingling in your mouth or throat. You have trouble breathing. You swallow any chlorhexidine. Summary Chlorhexidine gluconate (CHG) is a germ-killing (antiseptic) solution that is used to clean the skin. Cleaning your skin with CHG may help to lower your risk for infection. You may be given CHG to use for bathing. It may be in a bottle or in a prepackaged cloth to use on your skin. Carefully follow your health care provider's instructions and the instructions on the product label. Do not use CHG if you have a chlorhexidine allergy. Contact your health care provider if your skin gets irritated after scrubbing. This information is not intended to replace advice given to you by your health care provider. Make sure you discuss any questions you have with your healthcare provider. Document Revised: 03/07/2020 Document Reviewed: 04/11/2020 Elsevier Patient Education  2022 [employer].

## 2021-05-20 ENCOUNTER — Other Ambulatory Visit: Payer: Self-pay

## 2021-05-20 ENCOUNTER — Encounter (HOSPITAL_COMMUNITY)
Admission: RE | Admit: 2021-05-20 | Discharge: 2021-05-20 | Disposition: A | Discharge status: Home / Self Care | Payer: Medicaid Other | Source: Ambulatory Visit | Attending: General Surgery | Admitting: General Surgery

## 2021-05-20 ENCOUNTER — Encounter (HOSPITAL_COMMUNITY): Payer: Self-pay

## 2021-05-20 DIAGNOSIS — Z01812 Encounter for preprocedural laboratory examination: Secondary | ICD-10-CM | POA: Insufficient documentation

## 2021-05-20 LAB — CBC WITH DIFFERENTIAL/PLATELET
Abs Immature Granulocytes: 0.05 10*3/uL (ref 0.00–0.07)
Basophils Absolute: 0.1 10*3/uL (ref 0.0–0.1)
Basophils Relative: 0 %
Eosinophils Absolute: 0.4 10*3/uL (ref 0.0–0.5)
Eosinophils Relative: 4 %
HCT: 43.5 % (ref 36.0–46.0)
Hemoglobin: 13.8 g/dL (ref 12.0–15.0)
Immature Granulocytes: 0 %
Lymphocytes Relative: 28 %
Lymphs Abs: 3.3 10*3/uL (ref 0.7–4.0)
MCH: 29.2 pg (ref 26.0–34.0)
MCHC: 31.7 g/dL (ref 30.0–36.0)
MCV: 92.2 fL (ref 80.0–100.0)
Monocytes Absolute: 0.8 10*3/uL (ref 0.1–1.0)
Monocytes Relative: 7 %
Neutro Abs: 7.1 10*3/uL (ref 1.7–7.7)
Neutrophils Relative %: 61 %
Platelets: 273 10*3/uL (ref 150–400)
RBC: 4.72 MIL/uL (ref 3.87–5.11)
RDW: 13.2 % (ref 11.5–15.5)
WBC: 11.7 10*3/uL — ABNORMAL HIGH (ref 4.0–10.5)
nRBC: 0 % (ref 0.0–0.2)

## 2021-05-20 LAB — BASIC METABOLIC PANEL
Anion gap: 9 (ref 5–15)
BUN: 13 mg/dL (ref 6–20)
CO2: 24 mmol/L (ref 22–32)
Calcium: 8.5 mg/dL — ABNORMAL LOW (ref 8.9–10.3)
Chloride: 104 mmol/L (ref 98–111)
Creatinine, Ser: 0.61 mg/dL (ref 0.44–1.00)
GFR, Estimated: >60 mL/min (ref >60)
Glucose, Bld: 160 mg/dL — ABNORMAL HIGH (ref 70–99)
Potassium: 3.4 mmol/L — ABNORMAL LOW (ref 3.5–5.1)
Sodium: 137 mmol/L (ref 135–145)

## 2021-05-20 LAB — HEMOGLOBIN A1C
Hgb A1c MFr Bld: 5.8 % — ABNORMAL HIGH (ref 4.8–5.6)
Mean Plasma Glucose: 119.76 mg/dL

## 2021-05-22 ENCOUNTER — Encounter (HOSPITAL_COMMUNITY): Payer: Self-pay | Admitting: General Surgery

## 2021-05-22 ENCOUNTER — Other Ambulatory Visit: Payer: Self-pay

## 2021-05-22 ENCOUNTER — Ambulatory Visit (HOSPITAL_COMMUNITY): Payer: Medicaid Other | Admitting: Anesthesiology

## 2021-05-22 ENCOUNTER — Encounter (HOSPITAL_COMMUNITY)
Admission: RE | Disposition: A | Discharge status: Home / Self Care | Payer: Self-pay | Source: Home / Self Care | Attending: General Surgery

## 2021-05-22 ENCOUNTER — Ambulatory Visit (HOSPITAL_COMMUNITY)
Admission: RE | Admit: 2021-05-22 | Discharge: 2021-05-22 | Disposition: A | Discharge status: Home / Self Care | Payer: Medicaid Other | Attending: General Surgery | Admitting: General Surgery

## 2021-05-22 DIAGNOSIS — Z87891 Personal history of nicotine dependence: Secondary | ICD-10-CM | POA: Diagnosis not present

## 2021-05-22 DIAGNOSIS — K429 Umbilical hernia without obstruction or gangrene: Secondary | ICD-10-CM | POA: Diagnosis not present

## 2021-05-22 DIAGNOSIS — Z79890 Hormone replacement therapy (postmenopausal): Secondary | ICD-10-CM | POA: Diagnosis not present

## 2021-05-22 DIAGNOSIS — Z7984 Long term (current) use of oral hypoglycemic drugs: Secondary | ICD-10-CM | POA: Insufficient documentation

## 2021-05-22 DIAGNOSIS — M797 Fibromyalgia: Secondary | ICD-10-CM | POA: Diagnosis not present

## 2021-05-22 DIAGNOSIS — Z880 Allergy status to penicillin: Secondary | ICD-10-CM | POA: Diagnosis not present

## 2021-05-22 DIAGNOSIS — Z8616 Personal history of COVID-19: Secondary | ICD-10-CM | POA: Diagnosis not present

## 2021-05-22 DIAGNOSIS — Z79899 Other long term (current) drug therapy: Secondary | ICD-10-CM | POA: Diagnosis not present

## 2021-05-22 DIAGNOSIS — Z885 Allergy status to narcotic agent status: Secondary | ICD-10-CM | POA: Diagnosis not present

## 2021-05-22 HISTORY — PX: UMBILICAL HERNIA REPAIR: SHX196

## 2021-05-22 LAB — GLUCOSE, CAPILLARY: Glucose-Capillary: 102 mg/dL — ABNORMAL HIGH (ref 70–99)

## 2021-05-22 SURGERY — REPAIR, HERNIA, UMBILICAL, ADULT
Anesthesia: General | Site: Abdomen

## 2021-05-22 MED ORDER — BUPIVACAINE LIPOSOME 1.3 % IJ SUSP
INTRAMUSCULAR | Status: AC
  Filled 2021-05-22: qty 20

## 2021-05-22 MED ORDER — KETOROLAC TROMETHAMINE 30 MG/ML IJ SOLN
30.0000 mg | Freq: Once | INTRAMUSCULAR | Status: AC
  Administered 2021-05-22: 30 mg via INTRAVENOUS

## 2021-05-22 MED ORDER — SUGAMMADEX SODIUM 500 MG/5ML IV SOLN
INTRAVENOUS | Status: AC
  Filled 2021-05-22: qty 5

## 2021-05-22 MED ORDER — CHLORHEXIDINE GLUCONATE CLOTH 2 % EX PADS
6.0000 | MEDICATED_PAD | Freq: Once | CUTANEOUS | Status: AC
  Administered 2021-05-22: 6 via TOPICAL

## 2021-05-22 MED ORDER — KETOROLAC TROMETHAMINE 30 MG/ML IJ SOLN
INTRAMUSCULAR | Status: AC
  Filled 2021-05-22: qty 1

## 2021-05-22 MED ORDER — MIDAZOLAM HCL 5 MG/5ML IJ SOLN
INTRAMUSCULAR | Status: DC | PRN
  Administered 2021-05-22: 2 mg via INTRAVENOUS

## 2021-05-22 MED ORDER — ROCURONIUM BROMIDE 100 MG/10ML IV SOLN
INTRAVENOUS | Status: DC | PRN
  Administered 2021-05-22: 10 mg via INTRAVENOUS
  Administered 2021-05-22: 50 mg via INTRAVENOUS

## 2021-05-22 MED ORDER — BUPIVACAINE LIPOSOME 1.3 % IJ SUSP
INTRAMUSCULAR | Status: DC | PRN
  Administered 2021-05-22: 20 mL

## 2021-05-22 MED ORDER — ORAL CARE MOUTH RINSE: 15.0000 mL | Freq: Once | OROMUCOSAL | Status: DC

## 2021-05-22 MED ORDER — CHLORHEXIDINE GLUCONATE 0.12 % MT SOLN
OROMUCOSAL | Status: AC
  Filled 2021-05-22: qty 15

## 2021-05-22 MED ORDER — CIPROFLOXACIN IN D5W 400 MG/200ML IV SOLN
400.0000 mg | INTRAVENOUS | Status: AC
  Administered 2021-05-22: 400 mg via INTRAVENOUS

## 2021-05-22 MED ORDER — CIPROFLOXACIN IN D5W 400 MG/200ML IV SOLN
INTRAVENOUS | Status: AC
  Filled 2021-05-22: qty 200

## 2021-05-22 MED ORDER — FENTANYL CITRATE (PF) 250 MCG/5ML IJ SOLN
INTRAMUSCULAR | Status: AC
  Filled 2021-05-22: qty 5

## 2021-05-22 MED ORDER — ONDANSETRON HCL 4 MG/2ML IJ SOLN: 4.0000 mg | Freq: Once | INTRAMUSCULAR | Status: DC | PRN

## 2021-05-22 MED ORDER — DEXAMETHASONE SODIUM PHOSPHATE 10 MG/ML IJ SOLN
INTRAMUSCULAR | Status: AC
  Filled 2021-05-22: qty 1

## 2021-05-22 MED ORDER — FENTANYL CITRATE (PF) 100 MCG/2ML IJ SOLN
INTRAMUSCULAR | Status: DC | PRN
  Administered 2021-05-22: 100 ug via INTRAVENOUS
  Administered 2021-05-22 (×3): 50 ug via INTRAVENOUS

## 2021-05-22 MED ORDER — FENTANYL CITRATE (PF) 100 MCG/2ML IJ SOLN
25.0000 ug | INTRAMUSCULAR | Status: DC | PRN
  Filled 2021-05-22: qty 2

## 2021-05-22 MED ORDER — MIDAZOLAM HCL 2 MG/2ML IJ SOLN
INTRAMUSCULAR | Status: AC
  Filled 2021-05-22: qty 2

## 2021-05-22 MED ORDER — LACTATED RINGERS IV SOLN: INTRAVENOUS | Status: DC

## 2021-05-22 MED ORDER — CHLORHEXIDINE GLUCONATE 0.12 % MT SOLN: 15.0000 mL | Freq: Once | OROMUCOSAL | Status: DC

## 2021-05-22 MED ORDER — SUGAMMADEX SODIUM 200 MG/2ML IV SOLN
INTRAVENOUS | Status: DC | PRN
  Administered 2021-05-22: 270 mg via INTRAVENOUS

## 2021-05-22 MED ORDER — ONDANSETRON HCL 4 MG/2ML IJ SOLN
INTRAMUSCULAR | Status: DC | PRN
  Administered 2021-05-22: 4 mg via INTRAVENOUS

## 2021-05-22 MED ORDER — PROPOFOL 10 MG/ML IV BOLUS
INTRAVENOUS | Status: DC | PRN
  Administered 2021-05-22: 200 mg via INTRAVENOUS

## 2021-05-22 MED ORDER — LIDOCAINE HCL (CARDIAC) PF 100 MG/5ML IV SOSY
PREFILLED_SYRINGE | INTRAVENOUS | Status: DC | PRN
  Administered 2021-05-22: 100 mg via INTRAVENOUS

## 2021-05-22 MED ORDER — ONDANSETRON HCL 4 MG/2ML IJ SOLN
INTRAMUSCULAR | Status: AC
  Filled 2021-05-22: qty 2

## 2021-05-22 MED ORDER — SODIUM CHLORIDE 0.9 % IR SOLN
Status: DC | PRN
  Administered 2021-05-22: 1000 mL

## 2021-05-22 SURGICAL SUPPLY — 38 items
ADH SKN CLS APL DERMABOND .7 (GAUZE/BANDAGES/DRESSINGS) ×2
APL PRP STRL LF DISP 70% ISPRP (MISCELLANEOUS) ×1
BLADE SURG 15 STRL LF DISP TIS (BLADE) ×1 IMPLANT
BLADE SURG 15 STRL SS (BLADE) ×2
CHLORAPREP W/TINT 26 (MISCELLANEOUS) ×2 IMPLANT
CLOTH BEACON ORANGE TIMEOUT ST (SAFETY) ×2 IMPLANT
COVER LIGHT HANDLE STERIS (MISCELLANEOUS) ×4 IMPLANT
DERMABOND ADVANCED (GAUZE/BANDAGES/DRESSINGS) ×2
DERMABOND ADVANCED .7 DNX12 (GAUZE/BANDAGES/DRESSINGS) ×1 IMPLANT
DRSG TEGADERM 2-3/8X2-3/4 SM (GAUZE/BANDAGES/DRESSINGS) ×1 IMPLANT
ELECT REM PT RETURN 9FT ADLT (ELECTROSURGICAL) ×2
ELECTRODE REM PT RTRN 9FT ADLT (ELECTROSURGICAL) ×1 IMPLANT
GAUZE 4X4 16PLY ~~LOC~~+RFID DBL (SPONGE) ×1 IMPLANT
GLOVE SURG ENC MOIS LTX SZ6.5 (GLOVE) ×2 IMPLANT
GLOVE SURG UNDER POLY LF SZ6.5 (GLOVE) ×2 IMPLANT
GLOVE SURG UNDER POLY LF SZ7 (GLOVE) ×2 IMPLANT
GOWN STRL REUS W/TWL LRG LVL3 (GOWN DISPOSABLE) ×4 IMPLANT
INST SET MINOR GENERAL (KITS) ×2 IMPLANT
KIT TURNOVER KIT A (KITS) ×2 IMPLANT
MANIFOLD NEPTUNE II (INSTRUMENTS) ×2 IMPLANT
MESH VENTRALEX ST 2.5 CRC MED (Mesh General) ×1 IMPLANT
NDL HYPO 18GX1.5 BLUNT FILL (NEEDLE) ×1 IMPLANT
NDL HYPO 21X1.5 SAFETY (NEEDLE) ×1 IMPLANT
NEEDLE HYPO 18GX1.5 BLUNT FILL (NEEDLE) ×2 IMPLANT
NEEDLE HYPO 21X1.5 SAFETY (NEEDLE) ×2 IMPLANT
NS IRRIG 1000ML POUR BTL (IV SOLUTION) ×2 IMPLANT
PACK MINOR (CUSTOM PROCEDURE TRAY) ×2 IMPLANT
PAD ARMBOARD 7.5X6 YLW CONV (MISCELLANEOUS) ×2 IMPLANT
PENCIL SMOKE EVACUATOR (MISCELLANEOUS) ×2 IMPLANT
SEALER TISSUE X1 CVD JAW (INSTRUMENTS) ×1 IMPLANT
SET BASIN LINEN APH (SET/KITS/TRAYS/PACK) ×2 IMPLANT
SPONGE GAUZE 2X2 8PLY STRL LF (GAUZE/BANDAGES/DRESSINGS) ×1 IMPLANT
SUT ETHIBOND NAB MO 7 #0 18IN (SUTURE) ×2 IMPLANT
SUT MNCRL AB 4-0 PS2 18 (SUTURE) ×3 IMPLANT
SUT VIC AB 2-0 CT2 27 (SUTURE) ×1 IMPLANT
SUT VIC AB 3-0 SH 27 (SUTURE) ×2
SUT VIC AB 3-0 SH 27X BRD (SUTURE) ×1 IMPLANT
SYR 20ML LL LF (SYRINGE) ×4 IMPLANT

## 2021-05-22 NOTE — Interval H&P Note (Signed)
History and Physical Interval Note:  05/22/2021 8:41 AM  Robin Bender  has presented today for surgery, with the diagnosis of Umbilical hernia K42.9.  The various methods of treatment have been discussed with the patient and family. After consideration of risks, benefits and other options for treatment, the patient has consented to  Procedure(s): HERNIA REPAIR UMBILICAL ADULT W/MESH (N/A) as a surgical intervention.  The patient's history has been reviewed, patient examined, no change in status, stable for surgery.  I have reviewed the patient's chart and labs.  Questions were answered to the patient's satisfaction.    No changes. Lucretia Roers

## 2021-05-22 NOTE — Anesthesia Procedure Notes (Signed)
Procedure Name: Intubation Date/Time: 05/22/2021 9:11 AM Performed by: Junious Silk, CRNA Pre-anesthesia Checklist: Patient identified, Emergency Drugs available, Suction available, Patient being monitored and Timeout performed Patient Re-evaluated:Patient Re-evaluated prior to induction Oxygen Delivery Method: Circle system utilized Preoxygenation: Pre-oxygenation with 100% oxygen Induction Type: IV induction Ventilation: Mask ventilation without difficulty and Oral airway inserted - appropriate to patient size Laryngoscope Size: Hyacinth Meeker and 2 Grade View: Grade II Tube type: Oral Tube size: 7.0 mm Number of attempts: 1 Airway Equipment and Method: Bougie stylet Placement Confirmation: ETT inserted through vocal cords under direct vision, positive ETCO2, CO2 detector and breath sounds checked- equal and bilateral Secured at: 24 cm Tube secured with: Tape Dental Injury: Teeth and Oropharynx as per pre-operative assessment  Difficulty Due To: Difficulty was anticipated Comments: Pt was easy mask away with oral airway in place. Grade 2 view with DL, unable to pass ETT due to large amount on redundant tissue. Bougie stylet placed easily and ETT placed without difficulty. Atraumatic intubation

## 2021-05-22 NOTE — Anesthesia Postprocedure Evaluation (Signed)
Anesthesia Post Note  Patient: Robin Bender  Procedure(s) Performed: HERNIA REPAIR UMBILICAL ADULT W/MESH (Abdomen)  Patient location during evaluation: Phase II Anesthesia Type: General Level of consciousness: awake Pain management: pain level controlled Vital Signs Assessment: post-procedure vital signs reviewed and stable Respiratory status: spontaneous breathing and respiratory function stable Cardiovascular status: blood pressure returned to baseline and stable Postop Assessment: no headache and no apparent nausea or vomiting Anesthetic complications: no Comments: Late entry   No notable events documented.   Last Vitals:  Vitals:   05/22/21 1030 05/22/21 1045  BP: 135/88 130/79  Pulse: 82 82  Resp: 10 15  Temp: 36.8 C   SpO2: 99% 100%    Last Pain:  Vitals:   05/22/21 1030  TempSrc:   PainSc: 8                  Windell Norfolk

## 2021-05-22 NOTE — Anesthesia Preprocedure Evaluation (Signed)
Anesthesia Evaluation  Patient identified by MRN, date of birth, ID band Patient awake    Reviewed: Allergy & Precautions, H&P , NPO status , Patient's Chart, lab work & pertinent test results, reviewed documented beta blocker date and time   Airway Mallampati: I  TM Distance: >3 FB Neck ROM: full    Dental no notable dental hx.    Pulmonary sleep apnea , pneumonia, former smoker,    Pulmonary exam normal breath sounds clear to auscultation       Cardiovascular Exercise Tolerance: Good negative cardio ROS   Rhythm:regular Rate:Normal     Neuro/Psych  Headaches, PSYCHIATRIC DISORDERS Anxiety Depression Bipolar Disorder  Neuromuscular disease    GI/Hepatic negative GI ROS, Neg liver ROS,   Endo/Other  Morbid obesity  Renal/GU negative Renal ROS  negative genitourinary   Musculoskeletal   Abdominal   Peds  Hematology  (+) Blood dyscrasia, anemia ,   Anesthesia Other Findings   Reproductive/Obstetrics negative OB ROS                             Anesthesia Physical Anesthesia Plan  ASA: 3  Anesthesia Plan: General   Post-op Pain Management:    Induction:   PONV Risk Score and Plan:   Airway Management Planned:   Additional Equipment:   Intra-op Plan:   Post-operative Plan:   Informed Consent: I have reviewed the patients History and Physical, chart, labs and discussed the procedure including the risks, benefits and alternatives for the proposed anesthesia with the patient or authorized representative who has indicated his/her understanding and acceptance.     Dental Advisory Given  Plan Discussed with: CRNA  Anesthesia Plan Comments:         Anesthesia Quick Evaluation

## 2021-05-22 NOTE — Discharge Instructions (Signed)
Discharge Instructions Hernia:  Common Complaints: Pain at the incision site is common. This will improve with time. Take your pain medications as described below. Some nausea is common and poor appetite. The main goal is to stay hydrated the first few days after surgery.   Diet/ Activity: Diet as tolerated. You may not have an appetite, but it is important to stay hydrated. Drink 64 ounces of water a day. Your appetite will return with time.  Remove the small clear dressing and gauze after two days (48 hours). Trim the gauze off the glue that is underneath if the gauze is stuck to the glue. Shower per your regular routine daily.  Do not take hot showers. Take warm showers that are less than 10 minutes. Rest and listen to your body, but do not remain in bed all day.Walk everyday for at least 15-20 minutes.  Deep cough and move around every 1-2 hours in the first few days after surgery. Do not pick at the dermabond glue on your incision sites.  This glue film will remain in place for 1-2 weeks and will start to peel off. Do not place lotions or balms on your incision unless instructed to specifically by Dr. Henreitta Leber. Do not lift > 10 lbs, perform excessive bending, pushing, pulling, squatting for 6-8 weeks after surgery. Where your abdominal binder with activity as much as possible. The activity restrictions and the abdominal binder are to prevent hernia formation at your incision while you are healing.   Pain Expectations and Narcotics: -After surgery you will have pain associated with your incisions and this is normal. The pain is muscular and nerve pain, and will get better with time. -You are encouraged and expected to take non narcotic medications like tylenol and ibuprofen (when able) to treat pain as multiple modalities can aid with pain treatment. -Narcotics are only used when pain is severe or there is breakthrough pain. -You are not expected to have a pain score of 0 after surgery, as  we cannot prevent pain. A pain score of 3-4 that allows you to be functional, move, walk, and tolerate some activity is the goal. The pain will continue to improve over the days after surgery and is dependent on your surgery. -Due to [place] law, we are only able to give a certain amount of pain medication to treat post operative pain, and we only give additional narcotics on a patient by patient basis.  -For most laparoscopic surgery, studies have shown that the majority of patients only need 10-15 narcotic pills, and for open surgeries most patients only need 15-20.   -Having appropriate expectations of pain and knowledge of pain management with non narcotics is important as we do not want anyone to become addicted to narcotic pain medication.  -Using ice packs in the first 48 hours and heating pads after 48 hours, wearing an abdominal binder (when recommended), and using over the counter medications are all ways to help with pain management.   -Simple acts like meditation and mindfulness practices after surgery can also help with pain control and research has proven the benefit of these practices.  Medication: Take tylenol and ibuprofen as needed for pain control, alternating every 4-6 hours.  Example:  Tylenol 1000mg  @ 6am, 12noon, 6pm, (Do not exceed 4000mg  of tylenol a day). Ibuprofen 800mg  @ 9am, 3pm, 9pm, 3am (Do not exceed 3600mg  of ibuprofen a day).  Take your home narcotics for breakthrough pain as prescribed.  Your Hydrocodone has tylenol in it  so be sure to not exceed 4000 mg of tylenol a day.  Take your Linzess as prescribed. Drink plenty of water to also prevent constipation.   Contact Information: If you have questions or concerns, please call our office, [phone], Monday- Thursday 8AM-5PM and Friday 8AM-12Noon.  If it is after hours or on the weekend, please call [facility], [phone], [phone], and ask to speak to the surgeon on call for Dr. Henreitta Leber at  [facility].

## 2021-05-22 NOTE — Op Note (Signed)
[facility] Operative Note  05/22/21  Preoperative Diagnosis: Umbilical hernia    Postoperative Diagnosis: Same   Procedure(s) Performed: Umbilical hernia repair with mesh (Ventralex ST Patch 6.4cm)    Surgeon: Leatrice Jewels. Henreitta Leber, MD   Assistants: No qualified resident was available    Anesthesia: General endotracheal   Anesthesiologist: Windell Norfolk, MD    Specimens: None    Estimated Blood Loss: Minimal   Blood Replacement: None    Complications: None   Wound Class: Clean    Operative Indications: Ms. Rocco is a 45 yo with an umbilical hernia that has been causing her discomfort. It was present on a CT in 2015 when she had her appendix removed. We discussed repair with mesh and risk of bleeding, infection, recurrence, injury to bowel.   Findings: 1.5cm fascia defect with omentum mushroomed out    Procedure: The patient was taken to the operating room and placed supine. General endotracheal anesthesia was induced. Intravenous antibiotics were  administered per protocol.  The abdomen was prepared and draped in the usual sterile fashion.   The umbilical hernia was noted to be reducible and measured about 1.5cm. An incision was made under the umbilicus, and carried down through the subcutaneous tissue with electrocautery.  Dissection was performed down to the level of the fascia, exposing the hernia sac.  The hernia sac was opened with care, and excess hernia sac was resected with electrocautery.  A large mushroom of omentum was ligated out with the Enseal device.  Care was taken to ensure no bowel was involved. A finger was ran on the underlying peritoneum and this was clear.  A 6.4 cm Ventralex St Hernia Patch was placed and secured with 0 Ethibond sutures ensuring that it was against the peritoneal cavity.  The hernia defect was then closed with 0 Ethibond suture in an interrupted fashion over the patch.  The umbilical stalk had some injury to the skin where  it had been taken off the hernia sac. I closed with in layers with 3-0 Vicryl interrupted and a 4-0 Monocryl running and dermabond. Once it had dried I inverted the stalk into the umbilicus and it was tacked to the fascia with a 3-0 Vicryl suture.  The deep space was closed with 3-0 Vicryl interrupted. Hemostasis was confirmed. The skin was closed with a running 4-0 Monocryl suture and dermabond.  After the dermabond dried a 2X2 and tegaderm were placed over the umbilicus to act as a pressure dressing.    All counts were correct at the end of the case. The patient was awakened from anesthesia and extubated without complication.  The patient went to the PACU in stable condition.  Algis Greenhouse, MD [facility] [address] [place], [place] [address] [phone] (office)

## 2021-05-22 NOTE — Transfer of Care (Signed)
Immediate Anesthesia Transfer of Care Note  Patient: Robin Bender  Procedure(s) Performed: HERNIA REPAIR UMBILICAL ADULT W/MESH (Abdomen)  Patient Location: PACU  Anesthesia Type:General  Level of Consciousness: awake, alert  and oriented  Airway & Oxygen Therapy: Patient Spontanous Breathing and Patient connected to face mask oxygen  Post-op Assessment: Report given to RN and Post -op Vital signs reviewed and stable  Post vital signs: Reviewed and stable  Last Vitals:  Vitals Value Taken Time  BP 135/88 05/22/21 1027  Temp    Pulse 82 05/22/21 1030  Resp 10 05/22/21 1030  SpO2 99 % 05/22/21 1030  Vitals shown include unvalidated device data.  Last Pain:  Vitals:   05/22/21 0727  TempSrc: Oral  PainSc: 7          Complications: No notable events documented.

## 2021-05-22 NOTE — Progress Notes (Signed)
[facility]  Updated fiance about surgery. Abdominal binder ordered. Patient on narcotics at home and does not want more due to her contract.   Algis Greenhouse, MD [facility] [address] [place], [place] [address] [phone] (office)

## 2021-05-25 ENCOUNTER — Encounter (HOSPITAL_COMMUNITY): Payer: Self-pay | Admitting: General Surgery

## 2021-05-27 ENCOUNTER — Ambulatory Visit: Payer: Medicaid Other | Admitting: Podiatry

## 2021-06-11 ENCOUNTER — Ambulatory Visit (INDEPENDENT_AMBULATORY_CARE_PROVIDER_SITE_OTHER): Payer: Medicaid Other | Admitting: General Surgery

## 2021-06-11 ENCOUNTER — Encounter: Payer: Self-pay | Admitting: General Surgery

## 2021-06-11 ENCOUNTER — Other Ambulatory Visit: Payer: Self-pay

## 2021-06-11 VITALS — BP 121/84 | HR 88 | Temp 98.6°F | Resp 14 | Ht 64.0 in | Wt 303.0 lb

## 2021-06-11 DIAGNOSIS — Z09 Encounter for follow-up examination after completed treatment for conditions other than malignant neoplasm: Secondary | ICD-10-CM

## 2021-06-12 NOTE — Progress Notes (Signed)
Subjective:     Robin Bender  Patient presents with clear yellow drainage from the base of the umbilicus, status post umbilical herniorrhaphy with mesh.  Patient denies any fevers.  This started earlier today.  Patient has minimal incisional pain. Objective:    BP 121/84   Pulse 88   Temp 98.6 F (37 C) (Other (Comment))   Resp 14   Ht 5\' 4"  (1.626 m)   Wt (!) 303 lb (137.4 kg)   SpO2 95%   BMI 52.01 kg/m   General:  alert, cooperative, and no distress  Abdomen is soft.  Incision well-healed.  A pinhole opening is present at the base of the umbilicus with minimal erythema present.  Clear yellow fluid was able to be expressed.  No purulent drainage noted.     Assessment:    Small amount of seroma/peritoneal fluid that has drained from the base of the umbilicus.  There does not appear to be any infection present.  I reassured the patient that this should resolve without issue.    Plan:   Patient told to keep the wound clean and dry.  No need for antibiotic.  Patient will return should there be evidence of infection present.

## 2021-06-23 ENCOUNTER — Other Ambulatory Visit: Payer: Self-pay | Admitting: Physical Medicine and Rehabilitation

## 2021-06-23 DIAGNOSIS — M5126 Other intervertebral disc displacement, lumbar region: Secondary | ICD-10-CM

## 2021-06-25 ENCOUNTER — Encounter: Payer: Self-pay | Admitting: General Surgery

## 2021-06-25 ENCOUNTER — Other Ambulatory Visit: Payer: Self-pay

## 2021-06-25 ENCOUNTER — Ambulatory Visit (INDEPENDENT_AMBULATORY_CARE_PROVIDER_SITE_OTHER): Payer: Medicaid Other | Admitting: General Surgery

## 2021-06-25 VITALS — BP 119/81 | HR 105 | Temp 98.8°F | Resp 16 | Ht 64.0 in | Wt 304.0 lb

## 2021-06-25 DIAGNOSIS — K429 Umbilical hernia without obstruction or gangrene: Secondary | ICD-10-CM

## 2021-06-25 NOTE — Progress Notes (Signed)
[facility] Note   HPI:  45 y.o. Female presents to clinic for post-op follow-up evaluation after umbilical hernia repair. She is having some serous drainage from her umbilicus. No fevers and no erythema.   Review of Systems:  Serous drainage  All other review of systems: otherwise negative   Vital Signs:  BP 119/81   Pulse (!) 105   Temp 98.8 F (37.1 C) (Other (Comment))   Resp 16   Ht 5\' 4"  (1.626 m)   Wt (!) 304 lb (137.9 kg)   SpO2 95%   BMI 52.18 kg/m    Physical Exam:  Physical Exam Vitals reviewed.  Cardiovascular:     Rate and Rhythm: Normal rate.  Pulmonary:     Effort: Pulmonary effort is normal.  Abdominal:     General: There is no distension.     Palpations: Abdomen is soft.     Tenderness: There is no abdominal tenderness.     Hernia: No hernia is present.     Comments: Infraumbilical incision c/d/I with no erythema or drainage, umbilicus with small <26mm skin defect with drainage, (did get into the umbilical skin and repaired), silver nitrate to the opening, serous drainage expressed, neosporin and gauze placed   Neurological:     Mental Status: She is alert.     Assessment:  45 y.o. yo Female with a small area of dehiscence in the umbilicus where I entered the umbilical stalk skin, no signs of infection, serous drainage expressed. Silver nitrate to promote it to seal.  Plan:  Neosporin to the area, gauze balled up into the belly button, and gauze over the area with tape.  Change in 72 hours if not leaking, if having leaking or saturated dressing, then change it before then. Change it for at least the next 7 days.  Call with signs of infection, redness, heat.   Future Appointments  Date Time Provider Department Center  07/07/2021 10:45 AM 07/09/2021, MD RS-RS None  07/09/2021  9:30 AM GI-315 DG C-ARM RM 3 GI-315DG GI-315 W. WE  07/09/2021 10:00 AM GI-315 CT 1 GI-315CT GI-315 W. WE     05-23-1970, MD [facility] [address] [place], [place] [id number] (office)

## 2021-06-25 NOTE — Patient Instructions (Signed)
Neosporin to the area, gauze balled up into the belly button, and gauze over the area with tape.  Change in 72 hours if not leaking, if having leaking or saturated dressing, then change it before then. Change it for at least the next 7 days.  Call with signs of infection, redness, heat.

## 2021-06-26 ENCOUNTER — Inpatient Hospital Stay: Admission: RE | Admit: 2021-06-26 | Payer: Medicaid Other | Source: Ambulatory Visit

## 2021-06-26 ENCOUNTER — Other Ambulatory Visit: Payer: Medicaid Other

## 2021-07-07 ENCOUNTER — Encounter: Payer: Medicaid Other | Admitting: General Surgery

## 2021-07-09 ENCOUNTER — Other Ambulatory Visit: Payer: Medicaid Other

## 2021-07-15 ENCOUNTER — Other Ambulatory Visit: Payer: Medicaid Other

## 2021-07-15 ENCOUNTER — Inpatient Hospital Stay: Admission: RE | Admit: 2021-07-15 | Payer: Medicaid Other | Source: Ambulatory Visit

## 2021-07-17 ENCOUNTER — Ambulatory Visit
Admission: RE | Admit: 2021-07-17 | Discharge: 2021-07-17 | Disposition: A | Discharge status: Home / Self Care | Payer: Medicaid Other | Source: Ambulatory Visit | Attending: Physical Medicine and Rehabilitation | Admitting: Physical Medicine and Rehabilitation

## 2021-07-17 DIAGNOSIS — M5126 Other intervertebral disc displacement, lumbar region: Secondary | ICD-10-CM

## 2021-07-17 MED ORDER — IOPAMIDOL (ISOVUE-M 200) INJECTION 41%
15.0000 mL | Freq: Once | INTRAMUSCULAR | Status: AC
  Administered 2021-07-17: 15 mL via INTRATHECAL

## 2021-07-17 MED ORDER — ONDANSETRON HCL 4 MG/2ML IJ SOLN: 4.0000 mg | Freq: Once | INTRAMUSCULAR | Status: DC | PRN

## 2021-07-17 MED ORDER — ONDANSETRON 4 MG PO TBDP
4.0000 mg | ORAL_TABLET | Freq: Once | ORAL | Status: AC
  Administered 2021-07-17: 4 mg via ORAL

## 2021-07-17 MED ORDER — DIAZEPAM 5 MG PO TABS
10.0000 mg | ORAL_TABLET | Freq: Once | ORAL | Status: AC
  Administered 2021-07-17: 10 mg via ORAL

## 2021-07-17 MED ORDER — HYDROMORPHONE HCL 1 MG/ML IJ SOLN
0.5000 mg | Freq: Once | INTRAMUSCULAR | Status: AC
  Administered 2021-07-17: 0.5 mg via INTRAMUSCULAR

## 2021-07-17 NOTE — Discharge Instr - Other Orders (Addendum)
1018: pt reports pain 9/10 from myelogram procedure. Achy pain in back and legs. Pt allergic to Demerol, reports she has had Dilaudid in the past with no issues. See MAR.  10:28: Relief reported.

## 2021-07-17 NOTE — Discharge Instructions (Signed)

## 2021-07-21 ENCOUNTER — Encounter: Payer: Medicaid Other | Admitting: General Surgery

## 2021-09-22 ENCOUNTER — Ambulatory Visit (HOSPITAL_COMMUNITY): Payer: Medicaid Other | Attending: Physician Assistant | Admitting: Physical Therapy

## 2021-09-22 ENCOUNTER — Other Ambulatory Visit: Payer: Self-pay

## 2021-09-22 DIAGNOSIS — M5416 Radiculopathy, lumbar region: Secondary | ICD-10-CM | POA: Insufficient documentation

## 2021-09-22 NOTE — Therapy (Signed)
[place], [place], [address] Phone: [phone]   Fax:  [phone]  Physical Therapy Evaluation  Patient Details  Name: Robin Bender MRN: [id number] Date of Birth: 1976/06/08 Referring Provider (PT): Clemmie Krill   Encounter Date: 09/22/2021   PT End of Session - 09/22/21 1327     Visit Number 1    Number of Visits 12    Date for PT Re-Evaluation 11/03/21    Authorization Type medicaid [employer]    Authorization - Visit Number 1    Authorization - Number of Visits 27    Progress Note Due on Visit 10    PT Start Time 1135    PT Stop Time 1210    PT Time Calculation (min) 35 min    Activity Tolerance Patient tolerated treatment well    Behavior During Therapy [facility] for tasks assessed/performed             Past Medical History:  Diagnosis Date   Anemia    Anxiety    Arthritis    Bipolar disorder ([place])    Depression    Fatigue    Fibromyalgia    Headache    Pneumonia due to COVID-19 virus 07/30/2020   [facility]   PTSD (post-traumatic stress disorder)    Sleep apnea     Past Surgical History:  Procedure Laterality Date   ABDOMINAL HYSTERECTOMY     APPENDECTOMY     CARPAL TUNNEL RELEASE Bilateral    CESAREAN SECTION  2002, 2004   DE QUERVAIN'S RELEASE Left 07/03/2020   DORSAL COMPARTMENT RELEASE Right 09/01/2020   Procedure: RIGHT 1ST DORSAL COMPARTMENT RELEASE;  Surgeon: Marybelle Killings, MD;  Location: [place];  Service: Orthopedics;  Laterality: Right;   LAPAROSCOPIC APPENDECTOMY N/A 05/19/2014   Procedure: APPENDECTOMY LAPAROSCOPIC;  Surgeon: Jamesetta So, MD;  Location: [facility];  Service: General;  Laterality: N/A;   ovarian cyst removed     UMBILICAL HERNIA REPAIR N/A 05/22/2021   Procedure: HERNIA REPAIR UMBILICAL ADULT W/MESH;  Surgeon: Virl Cagey, MD;  Location: [facility];  Service: General;  Laterality: N/A;   VULVECTOMY PARTIAL Left  02/04/2021   Procedure: VULVECTOMY PARTIAL;  Surgeon: Florian Buff, MD;  Location: [facility];  Service: Gynecology;  Laterality: Left;    There were no vitals filed for this visit.    Subjective Assessment - 09/22/21 1145     Subjective Robin Bender states that she has had low back pain for the past ten years.  She has been on pain management as well as physical therapy prior to Covid.  She is now having pain going down into her RT Leg and is being referred to therapy again.   She states that staying in any position for to long causes increased pain.  She has had multiple injections with temporary relief.    Pertinent History hip and knee pain, fibromyalgia,    Limitations Sitting;Lifting;Reading;Standing;Walking;House hold activities    How long can you sit comfortably? 10 minutes    How long can you stand comfortably? 30 minutes    How long can you walk comfortably? almost immediate pain but she can tolerate walking for 20-30 minutes    Currently in Pain? Yes    Pain Score 8     Pain Location Back    Pain Orientation Right    Pain Descriptors / Indicators Aching;Throbbing    Pain Type Chronic pain  Pain Radiating Towards RT foot    Pain Onset More than a month ago    Pain Frequency Constant    Aggravating Factors  moving certain ways    Pain Relieving Factors not sure nothing seems to help    Effect of Pain on Daily Activities limits; will wake up when she turns over, Children help with housework                [facility] PT Assessment - 09/22/21 0001       Assessment   Medical Diagnosis lumbar radiculopathy    Referring Provider (PT) Wayland Denis    Onset Date/Surgical Date 06/23/21    Next MD Visit needs to be scheduled    Prior Therapy yes      Precautions   Precautions None      Restrictions   Weight Bearing Restrictions No      Balance Screen   Has the patient fallen in the past 6 months No    Has the patient had a decrease in activity level because of a  fear of falling?  Yes    Is the patient reluctant to leave their home because of a fear of falling?  No      Home Tourist information centre manager residence      Prior Function   Level of Independence Independent      Cognition   Overall Cognitive Status Within Functional Limits for tasks assessed      Functional Tests   Functional tests Single leg stance;Sit to Stand      Single Leg Stance   Comments RT: 2" Lt 7"      Sit to Stand   Comments 6 in 30"      Posture/Postural Control   Posture/Postural Control Postural limitations    Postural Limitations Decreased lumbar lordosis;Decreased thoracic kyphosis    Posture Comments protruding abdominal mm      ROM / Strength   AROM / PROM / Strength AROM;Strength      AROM   AROM Assessment Site Lumbar    Lumbar Flexion 20    Lumbar Extension 20      Strength   Strength Assessment Site Hip;Knee;Ankle    Right/Left Hip Right;Left    Right Hip Flexion 3/5    Right Hip Extension 3/5    Right Hip ABduction 4-/5    Left Hip Flexion 4/5    Left Hip Extension 5/5    Left Hip ABduction 4/5    Right/Left Knee Right;Left    Right Knee Flexion 5/5    Right Knee Extension 5/5    Left Knee Flexion 5/5    Left Knee Extension 5/5    Right/Left Ankle Right;Left    Right Ankle Dorsiflexion 4+/5    Left Ankle Dorsiflexion 5/5                        Objective measurements completed on examination: See above findings.       [facility] Adult PT Treatment/Exercise - 09/22/21 0001       Exercises   Exercises Lumbar      Lumbar Exercises: Stretches   Standing Extension 10 reps    Prone on Elbows Stretch 1 rep;60 seconds      Lumbar Exercises: Supine   Ab Set 10 reps                     PT Education - 09/22/21 1326  Education Details HEP    Person(s) Educated Patient    Methods Explanation;Handout;Verbal cues    Comprehension Verbalized understanding;Returned demonstration               PT Short Term Goals - 09/22/21 1347       PT SHORT TERM GOAL #1   Title PT to be I in HEP to allow pain level to be no greater than a 6/10    Time 3    Period Weeks    Status New    Target Date 10/13/21      PT SHORT TERM GOAL #2   Title PT radicular sx to be no further than the knee in Rt LE to demonstrate decreased nerve root irritation.    Time 3    Period Weeks    Status New      PT SHORT TERM GOAL #3   Title Pt to be able to demonstrate proper body mechanics for bed mobility and lifting.    Time 3    Period Weeks    Status New               PT Long Term Goals - 09/22/21 1350       PT LONG TERM GOAL #1   Title PT to be I in advance HEP to allow pain to be no greater than a 4/10    Time 6    Period Weeks    Status New    Target Date 11/03/21      PT LONG TERM GOAL #2   Title PT hip and back ROM to improve to allow pt to easily pick items off the floor for housecleaning    Time 6    Period Weeks    Status New      PT LONG TERM GOAL #3   Title PT LE strength  to be increased one grade to decrease stress on back, pt to be ablet to come sit to stand 12 x in 30 seconds    Time 6    Period Weeks    Status New      PT LONG TERM GOAL #4   Title PT SLS to be 15" or greater on both LE to reduce risk of falling    Time 6    Period Weeks    Status New                    Plan - 09/22/21 1328     Clinical Impression Statement Robin Bender is a 45 yo female who has been having low back pain for ten years following a fall down steps.  She has recently been having radicular sx down her right leg to her foot and has therefore been referred to skilled PT.  Evaluation demonstrates decreased ROM, decreased strength, decreased activity tolerance, postural dysfunction and increased pain.   Robin Bender will benefit from skilled PT to address these issues and improve her functional ability.    Personal Factors and Comorbidities Comorbidity 3+;Fitness;Time since  onset of injury/illness/exacerbation;Past/Current Experience    Comorbidities hip and knee pain, fibromyalgia.    Examination-Activity Limitations Bathing;Bed Mobility;Bend;Caring for Others;Carry;Dressing;Lift;Locomotion Level;Sit;Squat;Stand    Examination-Participation Restrictions Cleaning;Community Activity;Driving;Laundry;Meal Prep;Occupation;Shop;Yard Work    [employer]chant [employer] officer Evolving/Moderate complexity    Clinical Decision Making Moderate    Rehab Potential Good    PT Frequency 2x / week    PT Duration 6 weeks    PT Treatment/Interventions ADLs/Self Care Home Management;Patient/family education;Manual  techniques;Functional mobility training;Therapeutic exercise    PT Next Visit Plan see how extension affected pt, continue with stabilization and LE strengthening as well as education in biomechanics.    PT Home Exercise Plan standing ecxtension, POE, ab set    Consulted and Agree with Plan of Care Patient             Patient will benefit from skilled therapeutic intervention in order to improve the following deficits and impairments:  Decreased activity tolerance, Decreased balance, Decreased strength, Difficulty walking, Decreased range of motion, Pain  Visit Diagnosis: Radiculopathy, lumbar region     Problem List Patient Active Problem List   Diagnosis Date Noted   Umbilical hernia without obstruction and without gangrene 05/05/2021   Vulvar hypertrophy    Stenosing tenosynovitis of wrist    Tendinitis, de Quervain's 08/21/2020   Labia minora hypertrophy 07/16/2020   Hyperlipidemia 03/26/2020   Family history of heart disease 03/26/2020   Tobacco abuse 03/26/2020   PTSD (post-traumatic stress disorder) 01/10/2017   Major depressive disorder, recurrent episode, moderate (HCC) 01/10/2017   Generalized anxiety disorder 01/10/2017   Obesity, morbid, BMI 40.0-49.9 ([place]) 11/19/2016   Degenerative lumbar disc 11/19/2016   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

## 2021-09-23 ENCOUNTER — Telehealth (HOSPITAL_COMMUNITY): Payer: Self-pay

## 2021-09-23 NOTE — Telephone Encounter (Signed)
She cx due to her children are sick

## 2021-09-24 ENCOUNTER — Ambulatory Visit (HOSPITAL_COMMUNITY): Payer: Medicaid Other

## 2021-09-28 ENCOUNTER — Ambulatory Visit (HOSPITAL_COMMUNITY): Payer: Medicaid Other | Admitting: Physical Therapy

## 2021-09-29 ENCOUNTER — Ambulatory Visit (HOSPITAL_COMMUNITY): Payer: Medicaid Other | Admitting: Physical Therapy

## 2021-10-06 ENCOUNTER — Ambulatory Visit (HOSPITAL_COMMUNITY): Payer: Medicaid Other

## 2021-10-06 ENCOUNTER — Other Ambulatory Visit: Payer: Self-pay

## 2021-10-06 ENCOUNTER — Encounter (HOSPITAL_COMMUNITY): Payer: Self-pay

## 2021-10-06 DIAGNOSIS — M5416 Radiculopathy, lumbar region: Secondary | ICD-10-CM | POA: Diagnosis not present

## 2021-10-06 NOTE — Therapy (Signed)
[place], [place], [address] Phone: [phone]   Fax:  [phone]  Physical Therapy Treatment  Patient Details  Name: Robin Bender MRN: [id number] Date of Birth: [birth year] Referring Provider (PT): Clemmie Krill   Encounter Date: 10/06/2021   PT End of Session - 10/06/21 0931     Visit Number 2    Number of Visits 12    Date for PT Re-Evaluation 11/03/21    Authorization Type medicaid [employer]    Authorization - Visit Number 2    Authorization - Number of Visits 27    Progress Note Due on Visit 10    PT Start Time 0912    PT Stop Time 0952    PT Time Calculation (min) 40 min    Activity Tolerance Patient tolerated treatment well;Patient limited by pain    Behavior During Therapy [facility] for tasks assessed/performed             Past Medical History:  Diagnosis Date   Anemia    Anxiety    Arthritis    Bipolar disorder ([place])    Depression    Fatigue    Fibromyalgia    Headache    Pneumonia due to COVID-19 virus 07/30/2020   [facility]   PTSD (post-traumatic stress disorder)    Sleep apnea     Past Surgical History:  Procedure Laterality Date   ABDOMINAL HYSTERECTOMY     APPENDECTOMY     CARPAL TUNNEL RELEASE Bilateral    CESAREAN SECTION  2002, 2004   DE QUERVAIN'S RELEASE Left 07/03/2020   DORSAL COMPARTMENT RELEASE Right 09/01/2020   Procedure: RIGHT 1ST DORSAL COMPARTMENT RELEASE;  Surgeon: Marybelle Killings, MD;  Location: [place];  Service: Orthopedics;  Laterality: Right;   LAPAROSCOPIC APPENDECTOMY N/A 05/19/2014   Procedure: APPENDECTOMY LAPAROSCOPIC;  Surgeon: Jamesetta So, MD;  Location: [facility];  Service: General;  Laterality: N/A;   ovarian cyst removed     UMBILICAL HERNIA REPAIR N/A 05/22/2021   Procedure: HERNIA REPAIR UMBILICAL ADULT W/MESH;  Surgeon: Virl Cagey, MD;  Location: [facility];  Service: General;  Laterality: N/A;    VULVECTOMY PARTIAL Left 02/04/2021   Procedure: VULVECTOMY PARTIAL;  Surgeon: Florian Buff, MD;  Location: [facility];  Service: Gynecology;  Laterality: Left;    There were no vitals filed for this visit.   Subjective Assessment - 10/06/21 0916     Subjective Pt reports she is increased soreness following the exercises in lower back, does have radicular symptoms down to ankle lateral aspect Rt LE.  Pain scale 8/10.  Reports compliance with HEP daily.    Pertinent History hip and knee pain, fibromyalgia,    Currently in Pain? Yes    Pain Score 8     Pain Location Back    Pain Orientation Right;Lower    Pain Descriptors / Indicators Aching;Sore    Pain Type Chronic pain    Pain Radiating Towards Rt ankle    Pain Onset More than a month ago    Pain Frequency Constant    Aggravating Factors  moving certain ways    Pain Relieving Factors not sure nothing seems to help    Effect of Pain on Daily Activities limits; will wake up when she turns over, Children help with housework                [facility] PT Assessment - 10/06/21 0001  Assessment   Medical Diagnosis lumbar radiculopathy    Referring Provider (PT) Clemmie Krill    Onset Date/Surgical Date 06/23/21    Next MD Visit needs to be scheduled    Prior Therapy yes      Precautions   Precautions None                           [facility] Adult PT Treatment/Exercise - 10/06/21 0001       Posture/Postural Control   Posture/Postural Control Postural limitations    Postural Limitations Decreased lumbar lordosis;Decreased thoracic kyphosis    Posture Comments protruding abdominal mm      Exercises   Exercises Lumbar      Lumbar Exercises: Stretches   Single Knee to Chest Stretch 30 seconds;2 reps;Right;Left    Single Knee to Chest Stretch Limitations with towel assistance    Lower Trunk Rotation 5 reps;10 seconds    Standing Extension 10 reps    Standing Extension Limitations cueing not to look up to  reduce dizziness    Prone on Elbows Stretch 2 reps;60 seconds      Lumbar Exercises: Supine   Ab Set 10 reps    Glut Set 10 reps;3 seconds    Glut Set Limitations painful    Bent Knee Raise 10 reps;3 seconds      Lumbar Exercises: Prone   Other Prone Lumbar Exercises heel squeeze 10x 5"                     PT Education - 10/06/21 0927     Education Details Reviewed goals, educated importance of HEP compliance, pt able to recall through reports no improvements with lower back pain or radicular symptoms    Person(s) Educated Patient    Methods Explanation;Demonstration;Verbal cues    Comprehension Verbalized understanding;Returned demonstration              PT Short Term Goals - 09/22/21 1347       PT SHORT TERM GOAL #1   Title PT to be I in HEP to allow pain level to be no greater than a 6/10    Time 3    Period Weeks    Status New    Target Date 10/13/21      PT SHORT TERM GOAL #2   Title PT radicular sx to be no further than the knee in Rt LE to demonstrate decreased nerve root irritation.    Time 3    Period Weeks    Status New      PT SHORT TERM GOAL #3   Title Pt to be able to demonstrate proper body mechanics for bed mobility and lifting.    Time 3    Period Weeks    Status New               PT Long Term Goals - 09/22/21 1350       PT LONG TERM GOAL #1   Title PT to be I in advance HEP to allow pain to be no greater than a 4/10    Time 6    Period Weeks    Status New    Target Date 11/03/21      PT LONG TERM GOAL #2   Title PT hip and back ROM to improve to allow pt to easily pick items off the floor for housecleaning    Time 6    Period Weeks  Status New      PT LONG TERM GOAL #3   Title PT LE strength  to be increased one grade to decrease stress on back, pt to be ablet to come sit to stand 12 x in 30 seconds    Time 6    Period Weeks    Status New      PT LONG TERM GOAL #4   Title PT SLS to be 15" or greater on both  LE to reduce risk of falling    Time 6    Period Weeks    Status New                   Plan - 10/06/21 0933     Clinical Impression Statement Reviewed goals, educated importance of HEP compliance for maximal benefits, pt reports no improvements with extension based exercises with current HEP compliance.  Session focus on lumbar mobility and strengthening for core and proximal musculature.  Trial with flexion vs extension based exercises, reports of improvements wiht extension based exercises.  Verbal and tactile cueing for lumbar stability.  Pt limited by pain through session.    Personal Factors and Comorbidities Comorbidity 3+;Fitness;Time since onset of injury/illness/exacerbation;Past/Current Experience    Comorbidities hip and knee pain, fibromyalgia.    Examination-Activity Limitations Bathing;Bed Mobility;Bend;Caring for Others;Carry;Dressing;Lift;Locomotion Level;Sit;Squat;Stand    Examination-Participation Restrictions Cleaning;Community Activity;Driving;Laundry;Meal Prep;Occupation;Shop;Yard Work    Conservation officer, historic buildings Evolving/Moderate complexity    Clinical Decision Making Moderate    Rehab Potential Good    PT Frequency 2x / week    PT Duration 6 weeks    PT Treatment/Interventions ADLs/Self Care Home Management;Patient/family education;Manual techniques;Functional mobility training;Therapeutic exercise    PT Next Visit Plan Continue with extension based exercises, continue with stabilization and LE strengthening as well as education in biomechanics.    PT Home Exercise Plan standing extension, POE, ab set    Consulted and Agree with Plan of Care Patient             Patient will benefit from skilled therapeutic intervention in order to improve the following deficits and impairments:  Decreased activity tolerance, Decreased balance, Decreased strength, Difficulty walking, Decreased range of motion, Pain  Visit Diagnosis: Radiculopathy, lumbar  region     Problem List Patient Active Problem List   Diagnosis Date Noted   Umbilical hernia without obstruction and without gangrene 05/05/2021   Vulvar hypertrophy    Stenosing tenosynovitis of wrist    Tendinitis, de Quervain's 08/21/2020   Labia minora hypertrophy 07/16/2020   Hyperlipidemia 03/26/2020   Family history of heart disease 03/26/2020   Tobacco abuse 03/26/2020   PTSD (post-traumatic stress disorder) 01/10/2017   Major depressive disorder, recurrent episode, moderate (HCC) 01/10/2017   Generalized anxiety disorder 01/10/2017   Obesity, morbid, BMI 40.0-49.9 (HCC) 11/19/2016   Degenerative lumbar disc 11/19/2016   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 [place], [place], [address] Phone: [phone]   Fax:  [phone]  Name: Robin Bender MRN: [id number] Date of Birth: [birth year]

## 2021-10-08 ENCOUNTER — Ambulatory Visit (HOSPITAL_COMMUNITY): Payer: Medicaid Other | Attending: Physician Assistant | Admitting: Physical Therapy

## 2021-10-08 ENCOUNTER — Other Ambulatory Visit: Payer: Self-pay

## 2021-10-08 DIAGNOSIS — M5416 Radiculopathy, lumbar region: Secondary | ICD-10-CM | POA: Insufficient documentation

## 2021-10-08 NOTE — Therapy (Signed)
[facility] [address] [place], [place], [address] Phone: [phone]   Fax:  [phone]  Physical Therapy Treatment  Patient Details  Name: Robin Bender MRN: [id number] Date of Birth: 18-Jun-1976 Referring Provider (PT): Wayland Denis   Encounter Date: 10/08/2021   PT End of Session - 10/08/21 0948     Visit Number 3    Number of Visits 12    Date for PT Re-Evaluation 11/03/21    Authorization Type medicaid [employer]    Authorization - Visit Number 3    Authorization - Number of Visits 27    Progress Note Due on Visit 10    PT Start Time 0918    PT Stop Time 0958    PT Time Calculation (min) 40 min    Activity Tolerance Patient tolerated treatment well;Patient limited by pain    Behavior During Therapy [facility] for tasks assessed/performed             Past Medical History:  Diagnosis Date   Anemia    Anxiety    Arthritis    Bipolar disorder (HCC)    Depression    Fatigue    Fibromyalgia    Headache    Pneumonia due to COVID-19 virus 07/30/2020   [facility]   PTSD (post-traumatic stress disorder)    Sleep apnea     Past Surgical History:  Procedure Laterality Date   ABDOMINAL HYSTERECTOMY     APPENDECTOMY     CARPAL TUNNEL RELEASE Bilateral    CESAREAN SECTION  2002, 2004   DE QUERVAIN'S RELEASE Left 07/03/2020   DORSAL COMPARTMENT RELEASE Right 09/01/2020   Procedure: RIGHT 1ST DORSAL COMPARTMENT RELEASE;  Surgeon: Eldred Manges, MD;  Location: [facility];  Service: Orthopedics;  Laterality: Right;   LAPAROSCOPIC APPENDECTOMY N/A 05/19/2014   Procedure: APPENDECTOMY LAPAROSCOPIC;  Surgeon: Dalia Heading, MD;  Location: [facility];  Service: General;  Laterality: N/A;   ovarian cyst removed     UMBILICAL HERNIA REPAIR N/A 05/22/2021   Procedure: HERNIA REPAIR UMBILICAL ADULT W/MESH;  Surgeon: Lucretia Roers, MD;  Location: [facility];  Service: General;  Laterality: N/A;   VULVECTOMY  PARTIAL Left 02/04/2021   Procedure: VULVECTOMY PARTIAL;  Surgeon: Lazaro Arms, MD;  Location: [facility];  Service: Gynecology;  Laterality: Left;    There were no vitals filed for this visit.   Subjective Assessment - 10/08/21 0917     Subjective Pt states that she doing the exercises a couple times a day, they continue to make her sore.  States that she did a lot of laundry yesterday and that has her pain up.    Pertinent History hip and knee pain, fibromyalgia,    Currently in Pain? Yes    Pain Score 9     Pain Location Back    Pain Orientation Right    Pain Descriptors / Indicators Aching;Sore    Pain Onset More than a month ago    Pain Frequency Constant    Aggravating Factors  lifting    Pain Relieving Factors nothing                               [facility] Adult PT Treatment/Exercise - 10/08/21 0001       Exercises   Exercises Lumbar      Lumbar Exercises: Stretches   Single Knee to Chest Stretch Limitations --  Lower Trunk Rotation 5 reps;10 seconds    Other Lumbar Stretch Exercise wall arch ; hip excursions      Lumbar Exercises: Standing   Functional Squats 10 reps    Other Standing Lumbar Exercises single leg stance 5x B    Other Standing Lumbar Exercises wall arch      Lumbar Exercises: Seated   Other Seated Lumbar Exercises scapular retractionx 10hip adduction isometrics using ball 10 x hold x 5"    Other Seated Lumbar Exercises ab isometric x 10      Lumbar Exercises: Supine   Clam 10 reps    Bent Knee Raise 10 reps;3 seconds    Bridge 10 reps      Lumbar Exercises: Sidelying   Hip Abduction Both;10 reps                       PT Short Term Goals - 10/08/21 [id number]       PT SHORT TERM GOAL #1   Title PT to be I in HEP to allow pain level to be no greater than a 6/10    Time 3    Period Weeks    Status On-going    Target Date 10/13/21      PT SHORT TERM GOAL #2   Title PT radicular sx to be no further than the knee  in Rt LE to demonstrate decreased nerve root irritation.    Time 3    Period Weeks    Status On-going      PT SHORT TERM GOAL #3   Title Pt to be able to demonstrate proper body mechanics for bed mobility and lifting.    Time 3    Period Weeks    Status On-going               PT Long Term Goals - 10/08/21 1000       PT LONG TERM GOAL #1   Title PT to be I in advance HEP to allow pain to be no greater than a 4/10    Time 6    Period Weeks    Status On-going      PT LONG TERM GOAL #2   Title PT hip and back ROM to improve to allow pt to easily pick items off the floor for housecleaning    Time 6    Period Weeks    Status On-going      PT LONG TERM GOAL #3   Title PT LE strength  to be increased one grade to decrease stress on back, pt to be ablet to come sit to stand 12 x in 30 seconds    Time 6    Period Weeks    Status On-going      PT LONG TERM GOAL #4   Title PT SLS to be 15" or greater on both LE to reduce risk of falling    Time 6    Period Weeks    Status On-going                   Plan - 10/08/21 0949     Clinical Impression Statement Pt continues to be sore from exercises; therapist explained that this is normal.  If a person goes to the gym who has not worked out in a while they are normally sore for weeks.  Added stability exercises with good form noted with verbal explaination.  At end of session pt verbalizes that she  has difficulty rolling in bed.    Personal Factors and Comorbidities Comorbidity 3+;Fitness;Time since onset of injury/illness/exacerbation;Past/Current Experience    Comorbidities hip and knee pain, fibromyalgia.    Examination-Activity Limitations Bathing;Bed Mobility;Bend;Caring for Others;Carry;Dressing;Lift;Locomotion Level;Sit;Squat;Stand    Examination-Participation Restrictions Cleaning;Community Activity;Driving;Laundry;Meal Prep;Occupation;Shop;Yard Work    Merchant [employer] officer Evolving/Moderate complexity     Rehab Potential Good    PT Frequency 2x / week    PT Duration 6 weeks    PT Treatment/Interventions ADLs/Self Care Home Management;Patient/family education;Manual techniques;Functional mobility training;Therapeutic exercise    PT Next Visit Plan Educate in rolling techniques on large mat to improve core strength.   Continue with extension based exercises, continue with stabilization and LE strengthening as well as education in biomechanics.    PT Home Exercise Plan standing extension, POE, ab set    Consulted and Agree with Plan of Care Patient             Patient will benefit from skilled therapeutic intervention in order to improve the following deficits and impairments:  Decreased activity tolerance, Decreased balance, Decreased strength, Difficulty walking, Decreased range of motion, Pain  Visit Diagnosis: Radiculopathy, lumbar region     Problem List Patient Active Problem List   Diagnosis Date Noted   Umbilical hernia without obstruction and without gangrene 05/05/2021   Vulvar hypertrophy    Stenosing tenosynovitis of wrist    Tendinitis, de Quervain's 08/21/2020   Labia minora hypertrophy 07/16/2020   Hyperlipidemia 03/26/2020   Family history of heart disease 03/26/2020   Tobacco abuse 03/26/2020   PTSD (post-traumatic stress disorder) 01/10/2017   Major depressive disorder, recurrent episode, moderate (HCC) 01/10/2017   Generalized anxiety disorder 01/10/2017   Obesity, morbid, BMI 40.0-49.9 ([place]) 11/19/2016   Degenerative lumbar disc 11/19/2016   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 [place], [place], [address] Phone: [phone]   Fax:  [phone]  Name: Robin Bender MRN: [id number] Date of Birth: Apr 03, 1976

## 2021-10-13 ENCOUNTER — Other Ambulatory Visit: Payer: Self-pay

## 2021-10-13 ENCOUNTER — Encounter (HOSPITAL_COMMUNITY): Payer: Self-pay

## 2021-10-13 ENCOUNTER — Ambulatory Visit (HOSPITAL_COMMUNITY): Payer: Medicaid Other

## 2021-10-13 DIAGNOSIS — M5416 Radiculopathy, lumbar region: Secondary | ICD-10-CM | POA: Diagnosis not present

## 2021-10-13 NOTE — Therapy (Signed)
[facility] [address] [place], [place], [address] Phone: [phone]   Fax:  [phone]  Physical Therapy Treatment  Patient Details  Name: Robin Bender MRN: [id number] Date of Birth: 28-Oct-1976 Referring Provider (PT): Wayland Denis   Encounter Date: 10/13/2021   PT End of Session - 10/13/21 1001     Visit Number 4    Number of Visits 12    Date for PT Re-Evaluation 11/03/21    Authorization Type medicaid [employer]    Authorization - Visit Number 4    Authorization - Number of Visits 27    Progress Note Due on Visit 10    PT Start Time [phone]    PT Stop Time 0958    PT Time Calculation (min) 42 min    Activity Tolerance Patient tolerated treatment well;Patient limited by pain    Behavior During Therapy [facility] for tasks assessed/performed             Past Medical History:  Diagnosis Date   Anemia    Anxiety    Arthritis    Bipolar disorder (HCC)    Depression    Fatigue    Fibromyalgia    Headache    Pneumonia due to COVID-19 virus 07/30/2020   [facility]   PTSD (post-traumatic stress disorder)    Sleep apnea     Past Surgical History:  Procedure Laterality Date   ABDOMINAL HYSTERECTOMY     APPENDECTOMY     CARPAL TUNNEL RELEASE Bilateral    CESAREAN SECTION  2002, 2004   DE QUERVAIN'S RELEASE Left 07/03/2020   DORSAL COMPARTMENT RELEASE Right 09/01/2020   Procedure: RIGHT 1ST DORSAL COMPARTMENT RELEASE;  Surgeon: Eldred Manges, MD;  Location: [facility];  Service: Orthopedics;  Laterality: Right;   LAPAROSCOPIC APPENDECTOMY N/A 05/19/2014   Procedure: APPENDECTOMY LAPAROSCOPIC;  Surgeon: Dalia Heading, MD;  Location: [facility];  Service: General;  Laterality: N/A;   ovarian cyst removed     UMBILICAL HERNIA REPAIR N/A 05/22/2021   Procedure: HERNIA REPAIR UMBILICAL ADULT W/MESH;  Surgeon: Lucretia Roers, MD;  Location: [facility];  Service: General;  Laterality: N/A;   VULVECTOMY  PARTIAL Left 02/04/2021   Procedure: VULVECTOMY PARTIAL;  Surgeon: Lazaro Arms, MD;  Location: [facility];  Service: Gynecology;  Laterality: Left;    There were no vitals filed for this visit.   Subjective Assessment - 10/13/21 0920     Subjective Pt stated she is stiff this morning, has done a lot of house work activiites that increase her pain.  No reports of radicular symptons for the last couple days.                               [facility] Adult PT Treatment/Exercise - 10/13/21 0001       Bed Mobility   Bed Mobility Sit to Sidelying Left;Left Sidelying to Sit    Left Sidelying to Sit Supervision/Verbal cueing   instructed log rolling, rolling both directions 2x each   Sit to Sidelying Left Supervision/Verbal cueing   instructed log rolling, rolling both directions 2x each     Posture/Postural Control   Posture/Postural Control Postural limitations    Postural Limitations Decreased lumbar lordosis;Decreased thoracic kyphosis    Posture Comments protruding abdominal mm      Exercises   Exercises Lumbar      Lumbar Exercises: Stretches   Single  Knee to Chest Stretch 30 seconds;2 reps;Right;Left    Single Knee to Chest Stretch Limitations with towel assistance    Standing Extension 10 reps      Lumbar Exercises: Standing   Functional Squats 10 reps    Functional Squats Limitations 3D hip excursion, cueing for mechanics with squat    Other Standing Lumbar Exercises SLS 3x Lt 19", Rt 12" max; vector stance 3x 5"    Other Standing Lumbar Exercises alternating bent knee raise with ab set, 1 HHA      Lumbar Exercises: Seated   Sit to Stand 10 reps    Sit to Stand Limitations eccentric control, no HHA    Other Seated Lumbar Exercises scapular retraction rows with RTB 2x 10 5" holds    Other Seated Lumbar Exercises ab isometric x 10 with heel squeeze      Lumbar Exercises: Supine   Ab Set 10 reps    Pelvic Tilt Limitations reports radicular symptoms with  posterior tilt    Glut Set Limitations painful    Bent Knee Raise 10 reps;3 seconds    Bridge Limitations increased pain    Other Supine Lumbar Exercises Decreased tolerance with supine position      Lumbar Exercises: Sidelying   Hip Abduction Both;10 reps                       PT Short Term Goals - 10/08/21 1696       PT SHORT TERM GOAL #1   Title PT to be I in HEP to allow pain level to be no greater than a 6/10    Time 3    Period Weeks    Status On-going    Target Date 10/13/21      PT SHORT TERM GOAL #2   Title PT radicular sx to be no further than the knee in Rt LE to demonstrate decreased nerve root irritation.    Time 3    Period Weeks    Status On-going      PT SHORT TERM GOAL #3   Title Pt to be able to demonstrate proper body mechanics for bed mobility and lifting.    Time 3    Period Weeks    Status On-going               PT Long Term Goals - 10/08/21 1000       PT LONG TERM GOAL #1   Title PT to be I in advance HEP to allow pain to be no greater than a 4/10    Time 6    Period Weeks    Status On-going      PT LONG TERM GOAL #2   Title PT hip and back ROM to improve to allow pt to easily pick items off the floor for housecleaning    Time 6    Period Weeks    Status On-going      PT LONG TERM GOAL #3   Title PT LE strength  to be increased one grade to decrease stress on back, pt to be ablet to come sit to stand 12 x in 30 seconds    Time 6    Period Weeks    Status On-going      PT LONG TERM GOAL #4   Title PT SLS to be 15" or greater on both LE to reduce risk of falling    Time 6    Period Weeks  Status On-going                   Plan - 10/13/21 0954     Clinical Impression Statement Reviewed log rolling and bed mobility for core strengthening and mechanics.  Pt with decreased tolerance to supine position, improved tolerance with standing exercises.  Added stability exercises with good form and improved  tolerance.  Cueing for mechanics with squating.  Pt reports she has lost 14# since beginning therapy wiht change in diet.    Personal Factors and Comorbidities Comorbidity 3+;Fitness;Time since onset of injury/illness/exacerbation;Past/Current Experience    Comorbidities hip and knee pain, fibromyalgia.    Examination-Activity Limitations Bathing;Bed Mobility;Bend;Caring for Others;Carry;Dressing;Lift;Locomotion Level;Sit;Squat;Stand    Examination-Participation Restrictions Cleaning;Community Activity;Driving;Laundry;Meal Prep;Occupation;Shop;Yard Work    [employer] officer Evolving/Moderate complexity    Clinical Decision Making Moderate    Rehab Potential Good    PT Frequency 2x / week    PT Duration 6 weeks    PT Treatment/Interventions ADLs/Self Care Home Management;Patient/family education;Manual techniques;Functional mobility training;Therapeutic exercise    PT Next Visit Plan Continue with extension based exercises, continue with stabilization and LE strengthening as well as education in biomechanics.    PT Home Exercise Plan standing extension, POE, ab set    Consulted and Agree with Plan of Care Patient             Patient will benefit from skilled therapeutic intervention in order to improve the following deficits and impairments:  Decreased activity tolerance, Decreased balance, Decreased strength, Difficulty walking, Decreased range of motion, Pain  Visit Diagnosis: Radiculopathy, lumbar region     Problem List Patient Active Problem List   Diagnosis Date Noted   Umbilical hernia without obstruction and without gangrene 05/05/2021   Vulvar hypertrophy    Stenosing tenosynovitis of wrist    Tendinitis, de Quervain's 08/21/2020   Labia minora hypertrophy 07/16/2020   Hyperlipidemia 03/26/2020   Family history of heart disease 03/26/2020   Tobacco abuse 03/26/2020   PTSD (post-traumatic stress disorder) 01/10/2017   Major depressive disorder,  recurrent episode, moderate (HCC) 01/10/2017   Generalized anxiety disorder 01/10/2017   Obesity, morbid, BMI 40.0-49.9 ([place]) 11/19/2016   Degenerative lumbar disc 11/19/2016   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

## 2021-10-15 ENCOUNTER — Ambulatory Visit (HOSPITAL_COMMUNITY): Payer: Medicaid Other

## 2021-10-15 ENCOUNTER — Encounter (HOSPITAL_COMMUNITY): Payer: Self-pay

## 2021-10-15 ENCOUNTER — Other Ambulatory Visit: Payer: Self-pay

## 2021-10-15 DIAGNOSIS — M5416 Radiculopathy, lumbar region: Secondary | ICD-10-CM | POA: Diagnosis not present

## 2021-10-15 NOTE — Therapy (Signed)
[place], [place], [address] Phone: [phone]   Fax:  [phone]  Physical Therapy Treatment  Patient Details  Name: Robin Bender MRN: [id number] Date of Birth: Dec 13, 1975 Referring Provider (PT): Clemmie Krill   Encounter Date: 10/15/2021   PT End of Session - 10/15/21 1005     Visit Number 5    Number of Visits 12    Date for PT Re-Evaluation 11/03/21    Authorization Type medicaid [employer]    Authorization - Visit Number 5    Authorization - Number of Visits 27    Progress Note Due on Visit 10    PT Start Time [phone]    PT Stop Time 1018    PT Time Calculation (min) 40 min    Activity Tolerance Patient tolerated treatment well;Patient limited by pain    Behavior During Therapy [facility] for tasks assessed/performed             Past Medical History:  Diagnosis Date   Anemia    Anxiety    Arthritis    Bipolar disorder ([place])    Depression    Fatigue    Fibromyalgia    Headache    Pneumonia due to COVID-19 virus 07/30/2020   [facility]   PTSD (post-traumatic stress disorder)    Sleep apnea     Past Surgical History:  Procedure Laterality Date   ABDOMINAL HYSTERECTOMY     APPENDECTOMY     CARPAL TUNNEL RELEASE Bilateral    CESAREAN SECTION  2002, 2004   DE QUERVAIN'S RELEASE Left 07/03/2020   DORSAL COMPARTMENT RELEASE Right 09/01/2020   Procedure: RIGHT 1ST DORSAL COMPARTMENT RELEASE;  Surgeon: Marybelle Killings, MD;  Location: [place] [facility];  Service: Orthopedics;  Laterality: Right;   LAPAROSCOPIC APPENDECTOMY N/A 05/19/2014   Procedure: APPENDECTOMY LAPAROSCOPIC;  Surgeon: Jamesetta So, MD;  Location: [facility];  Service: General;  Laterality: N/A;   ovarian cyst removed     UMBILICAL HERNIA REPAIR N/A 05/22/2021   Procedure: HERNIA REPAIR UMBILICAL ADULT W/MESH;  Surgeon: Virl Cagey, MD;  Location: [facility];  Service: General;  Laterality: N/A;   VULVECTOMY  PARTIAL Left 02/04/2021   Procedure: VULVECTOMY PARTIAL;  Surgeon: Florian Buff, MD;  Location: [facility];  Service: Gynecology;  Laterality: Left;    There were no vitals filed for this visit.   Subjective Assessment - 10/15/21 0927     Subjective Did fine after last session. Tired today. Had some leg to ankle pain when performing leg lifts out to the side last session. Pain  in ankle intermittent now instead of all the time and not effecting her ability to walk like it was. Goal weight 276 lb.    Currently in Pain? Yes    Pain Score 6     Pain Location Back    Pain Orientation Posterior;Medial                [facility] Adult PT Treatment/Exercise - 10/15/21 0001       Self-Care   Self-Care Posture    Posture doing dishes, food prep at counter      Lumbar Exercises: Stretches   Standing Extension 10 reps      Lumbar Exercises: Standing   Functional Squats 10 reps    Functional Squats Limitations 3D hip excursion, cueing for mechanics with squat    Row Strengthening;Both;10 reps;Theraband    Theraband Level (Row) Level 3 (Green)  Shoulder Extension Strengthening;Both;10 reps;Theraband    Theraband Level (Shoulder Extension) Level 3 (Green)    Other Standing Lumbar Exercises SLS 30 sec x 2 each, Rt 12" max; vector stance 3x 5"Paloff press GTB NBOS x10 each    Other Standing Lumbar Exercises alternating bent knee raise with ab set, 1 HHA x10      Lumbar Exercises: Seated   Sit to Stand 15 reps    Sit to Stand Limitations eccentric control, no HHA                     PT Education - 10/15/21 1137     Education Details Standing a counter for food prep and dishes to decrease load on lumbar spine. "Soft knees."    Person(s) Educated Patient    Methods Explanation    Comprehension Verbalized understanding              PT Short Term Goals - 10/15/21 1140       PT SHORT TERM GOAL #1   Title PT to be I in HEP to allow pain level to be no greater than a 6/10     Time 3    Period Weeks    Status On-going    Target Date 10/13/21      PT SHORT TERM GOAL #2   Title PT radicular sx to be no further than the knee in Rt LE to demonstrate decreased nerve root irritation.    Time 3    Period Weeks    Status Partially Met      PT SHORT TERM GOAL #3   Title Pt to be able to demonstrate proper body mechanics for bed mobility and lifting.    Time 3    Period Weeks    Status On-going               PT Long Term Goals - 10/15/21 1140       PT LONG TERM GOAL #1   Title PT to be I in advance HEP to allow pain to be no greater than a 4/10    Time 6    Period Weeks    Status On-going      PT LONG TERM GOAL #2   Title PT hip and back ROM to improve to allow pt to easily pick items off the floor for housecleaning    Time 6    Period Weeks    Status On-going      PT LONG TERM GOAL #3   Title PT LE strength  to be increased one grade to decrease stress on back, pt to be ablet to come sit to stand 12 x in 30 seconds    Time 6    Period Weeks    Status On-going      PT LONG TERM GOAL #4   Title PT SLS to be 15" or greater on both LE to reduce risk of falling    Time 6    Period Weeks    Status On-going                   Plan - 10/15/21 1007     Clinical Impression Statement Session focused on core and lower extremity strengthening and balance exercises. Educated patient on "soft knees", abdominal set to decrease load on lumbar spine during standing activities such as dishes and food prep. Increased sit to stand to 15 reps. Resisted rows and shoulder extensions performed in standing with  staggered stance and green theraband. Added Paloff press with narrow base of support for core strengthening. Patient would continue to benefit from skilled physical therapy to reduce impairment and improve functional abilities.    Personal Factors and Comorbidities Comorbidity 3+;Fitness;Time since onset of injury/illness/exacerbation;Past/Current  Experience    Comorbidities hip and knee pain, fibromyalgia.    Examination-Activity Limitations Bathing;Bed Mobility;Bend;Caring for Others;Carry;Dressing;Lift;Locomotion Level;Sit;Squat;Stand    Examination-Participation Restrictions Cleaning;Community Activity;Driving;Laundry;Meal Prep;Occupation;Shop;Yard Work    [employer] officer Evolving/Moderate complexity    Rehab Potential Good    PT Frequency 2x / week    PT Duration 6 weeks    PT Treatment/Interventions ADLs/Self Care Home Management;Patient/family education;Manual techniques;Functional mobility training;Therapeutic exercise    PT Next Visit Plan Continue with extension based exercises, continue with stabilization and LE strengthening as well as education in biomechanics for lifting.    PT Home Exercise Plan standing extension, POE, ab set    Consulted and Agree with Plan of Care Patient             Patient will benefit from skilled therapeutic intervention in order to improve the following deficits and impairments:  Decreased activity tolerance, Decreased balance, Decreased strength, Difficulty walking, Decreased range of motion, Pain  Visit Diagnosis: Radiculopathy, lumbar region     Problem List Patient Active Problem List   Diagnosis Date Noted   Umbilical hernia without obstruction and without gangrene 05/05/2021   Vulvar hypertrophy    Stenosing tenosynovitis of wrist    Tendinitis, de Quervain's 08/21/2020   Labia minora hypertrophy 07/16/2020   Hyperlipidemia 03/26/2020   Family history of heart disease 03/26/2020   Tobacco abuse 03/26/2020   PTSD (post-traumatic stress disorder) 01/10/2017   Major depressive disorder, recurrent episode, moderate (HCC) 01/10/2017   Generalized anxiety disorder 01/10/2017   Obesity, morbid, BMI 40.0-49.9 ([place]) 11/19/2016   Degenerative lumbar disc 11/19/2016   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   Condyloma acuminatum of vulva 10/07/2016   Obstructive  sleep apnea syndrome 04/04/2015   Arthritis of both knees 06/02/2014   Fibromyalgia 01/23/2014   Chronic bilateral low back pain without sciatica 01/23/2014   Breast pain 10/17/2012   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 Hartnett-Rands, MS, PT Per [place] [phone]   Jeannie Done, PT 10/15/2021, 11:43 AM  [place], [place], [address] Phone: [phone]   Fax:  [phone]  Name: Robin Bender MRN: [id number] Date of Birth: 01-09-1976

## 2021-10-20 ENCOUNTER — Telehealth (HOSPITAL_COMMUNITY): Payer: Self-pay | Admitting: Physical Therapy

## 2021-10-20 ENCOUNTER — Ambulatory Visit (HOSPITAL_COMMUNITY): Payer: Medicaid Other | Admitting: Physical Therapy

## 2021-10-20 NOTE — Telephone Encounter (Signed)
First no show.    Called pt and left a message reminding pt of her next appointment on Thursday at 9:15.  Virgina Organ, PT CLT [phone]

## 2021-10-21 ENCOUNTER — Other Ambulatory Visit: Payer: Self-pay | Admitting: Cardiovascular Disease

## 2021-10-22 ENCOUNTER — Ambulatory Visit (HOSPITAL_COMMUNITY): Payer: Medicaid Other

## 2021-10-27 ENCOUNTER — Ambulatory Visit (HOSPITAL_COMMUNITY): Payer: Medicaid Other

## 2021-10-27 ENCOUNTER — Other Ambulatory Visit: Payer: Self-pay

## 2021-10-27 ENCOUNTER — Encounter (HOSPITAL_COMMUNITY): Payer: Self-pay

## 2021-10-27 DIAGNOSIS — M5416 Radiculopathy, lumbar region: Secondary | ICD-10-CM

## 2021-10-27 NOTE — Therapy (Signed)
[place], [place], [address] Phone: [phone]   Fax:  [phone]  Physical Therapy Treatment  Patient Details  Name: Robin Bender MRN: [id number] Date of Birth: 02-20-1976 Referring Provider (PT): Clemmie Krill   Encounter Date: 10/27/2021   PT End of Session - 10/27/21 0915     Visit Number 6    Number of Visits 12    Date for PT Re-Evaluation 11/03/21    Authorization Type medicaid [employer]    Authorization - Visit Number 6    Authorization - Number of Visits 27    Progress Note Due on Visit 10    PT Start Time 0908    PT Stop Time 0950    PT Time Calculation (min) 42 min    Activity Tolerance Patient tolerated treatment well;Patient limited by pain    Behavior During Therapy [facility] for tasks assessed/performed             Past Medical History:  Diagnosis Date   Anemia    Anxiety    Arthritis    Bipolar disorder ([place])    Depression    Fatigue    Fibromyalgia    Headache    Pneumonia due to COVID-19 virus 07/30/2020   [facility]   PTSD (post-traumatic stress disorder)    Sleep apnea     Past Surgical History:  Procedure Laterality Date   ABDOMINAL HYSTERECTOMY     APPENDECTOMY     CARPAL TUNNEL RELEASE Bilateral    CESAREAN SECTION  2002, 2004   DE QUERVAIN'S RELEASE Left 07/03/2020   DORSAL COMPARTMENT RELEASE Right 09/01/2020   Procedure: RIGHT 1ST DORSAL COMPARTMENT RELEASE;  Surgeon: Marybelle Killings, MD;  Location: [place];  Service: Orthopedics;  Laterality: Right;   LAPAROSCOPIC APPENDECTOMY N/A 05/19/2014   Procedure: APPENDECTOMY LAPAROSCOPIC;  Surgeon: Jamesetta So, MD;  Location: [facility];  Service: General;  Laterality: N/A;   ovarian cyst removed     UMBILICAL HERNIA REPAIR N/A 05/22/2021   Procedure: HERNIA REPAIR UMBILICAL ADULT W/MESH;  Surgeon: Virl Cagey, MD;  Location: [facility];  Service: General;  Laterality: N/A;    VULVECTOMY PARTIAL Left 02/04/2021   Procedure: VULVECTOMY PARTIAL;  Surgeon: Florian Buff, MD;  Location[facility];  Service: Gynecology;  Laterality: Left;    There were no vitals filed for this visit.   Subjective Assessment - 10/27/21 0909     Subjective Pt stated she has musculautre soreness in lower back, pain scale 5-6/*10.    Pertinent History hip and knee pain, fibromyalgia,    Currently in Pain? Yes    Pain Score 6     Pain Location Back    Pain Orientation Lower    Pain Descriptors / Indicators Sore;Aching    Pain Type Chronic pain    Pain Onset More than a month ago    Pain Frequency Constant    Aggravating Factors  lifting    Pain Relieving Factors nothing    Effect of Pain on Daily Activities limits; will wake up when she turns over, Children help wiht housework                               [facility] Adult PT Treatment/Exercise - 10/27/21 0001       Posture/Postural Control   Posture/Postural Control Postural limitations    Postural Limitations Decreased lumbar lordosis;Decreased thoracic  kyphosis    Posture Comments protruding abdominal mm      Exercises   Exercises Lumbar      Lumbar Exercises: Stretches   Standing Extension 10 reps    Other Lumbar Stretch Exercise Reports weight loss to 293#      Lumbar Exercises: Standing   Heel Raises 10 reps    Heel Raises Limitations with 2# UE overhead lift    Lifting From floor;From 12";10 reps    Lifting Weights (lbs) --   8# box   Lifting Limitations reviewed mechanics    Forward Lunge 10 reps    Forward Lunge Limitations 6in step    Row Strengthening;Both;Theraband;15 reps    Theraband Level (Row) Level 3 (Green)    Shoulder Extension Strengthening;Both;Theraband;15 reps    Theraband Level (Shoulder Extension) Level 3 (Green)    Other Standing Lumbar Exercises SLS 14" BLE max of 2; vector stance    Other Standing Lumbar Exercises alternating bent knee raise with ab set during GTB shoulder  extension      Lumbar Exercises: Seated   Sit to Stand 15 reps    Sit to Stand Limitations eccentric control, no HHA    Other Seated Lumbar Exercises --                       PT Short Term Goals - 10/15/21 1140       PT SHORT TERM GOAL #1   Title PT to be I in HEP to allow pain level to be no greater than a 6/10    Time 3    Period Weeks    Status On-going    Target Date 10/13/21      PT SHORT TERM GOAL #2   Title PT radicular sx to be no further than the knee in Rt LE to demonstrate decreased nerve root irritation.    Time 3    Period Weeks    Status Partially Met      PT SHORT TERM GOAL #3   Title Pt to be able to demonstrate proper body mechanics for bed mobility and lifting.    Time 3    Period Weeks    Status On-going               PT Long Term Goals - 10/15/21 1140       PT LONG TERM GOAL #1   Title PT to be I in advance HEP to allow pain to be no greater than a 4/10    Time 6    Period Weeks    Status On-going      PT LONG TERM GOAL #2   Title PT hip and back ROM to improve to allow pt to easily pick items off the floor for housecleaning    Time 6    Period Weeks    Status On-going      PT LONG TERM GOAL #3   Title PT LE strength  to be increased one grade to decrease stress on back, pt to be ablet to come sit to stand 12 x in 30 seconds    Time 6    Period Weeks    Status On-going      PT LONG TERM GOAL #4   Title PT SLS to be 15" or greater on both LE to reduce risk of falling    Time 6    Period Weeks    Status On-going  Plan - 10/27/21 1010     Clinical Impression Statement Continued session foucs with core and proximal strengthening incorporating functional strengthening activities.  Pt presesnts with good mechanics wiht theraband strengthening, added GTB rows and shoulder extension as well as heel raises for ankle stability to HEP.  Reviewed proper lifting mechanics that pt demonstrated good form,  reoprts of increased knee pain with task so modified with cueing for mechanics to reduce anterior aspect of knee irritation and added height to lift from with improved tolerance.    Personal Factors and Comorbidities Comorbidity 3+;Fitness;Time since onset of injury/illness/exacerbation;Past/Current Experience    Comorbidities hip and knee pain, fibromyalgia.    Examination-Activity Limitations Bathing;Bed Mobility;Bend;Caring for Others;Carry;Dressing;Lift;Locomotion Level;Sit;Squat;Stand    Examination-Participation Restrictions Cleaning;Community Activity;Driving;Laundry;Meal Prep;Occupation;Shop;Yard Work    [employer] officer Evolving/Moderate complexity    Clinical Decision Making Moderate    Rehab Potential Good    PT Frequency 2x / week    PT Duration 6 weeks    PT Treatment/Interventions ADLs/Self Care Home Management;Patient/family education;Manual techniques;Functional mobility training;Therapeutic exercise    PT Next Visit Plan Continue with extension based exercises, continue with stabilization and LE strengthening as well as education in biomechanics for lifting.    PT Home Exercise Plan standing extension, POE, ab set; 12/20: heel raise and GTB shoulder extension and row    Consulted and Agree with Plan of Care Patient             Patient will benefit from skilled therapeutic intervention in order to improve the following deficits and impairments:  Decreased activity tolerance, Decreased balance, Decreased strength, Difficulty walking, Decreased range of motion, Pain  Visit Diagnosis: Radiculopathy, lumbar region     Problem List Patient Active Problem List   Diagnosis Date Noted   Umbilical hernia without obstruction and without gangrene 05/05/2021   Vulvar hypertrophy    Stenosing tenosynovitis of wrist    Tendinitis, de Quervain's 08/21/2020   Labia minora hypertrophy 07/16/2020   Hyperlipidemia 03/26/2020   Family history of heart disease  03/26/2020   Tobacco abuse 03/26/2020   PTSD (post-traumatic stress disorder) 01/10/2017   Major depressive disorder, recurrent episode, moderate (HCC) 01/10/2017   Generalized anxiety disorder 01/10/2017   Obesity, morbid, BMI 40.0-49.9 ([place]) 11/19/2016   Degenerative lumbar disc 11/19/2016   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

## 2021-10-27 NOTE — Patient Instructions (Signed)
Heel Raise (Calf Strength / Balance)    Stand with support. Breathe in. Rise up on tiptoes, breathing out through pursed lips. Hold position to count of 3". Return slowly, breathing in. Repeat 10 times per session. Do 2 sessions per day.  Copyright  VHI. All rights reserved.

## 2021-10-29 ENCOUNTER — Ambulatory Visit (HOSPITAL_COMMUNITY): Payer: Medicaid Other

## 2021-11-03 ENCOUNTER — Encounter (HOSPITAL_COMMUNITY): Payer: Medicaid Other | Admitting: Physical Therapy

## 2021-11-05 ENCOUNTER — Encounter (HOSPITAL_COMMUNITY): Payer: Medicaid Other | Admitting: Physical Therapy

## 2021-11-11 ENCOUNTER — Ambulatory Visit (HOSPITAL_COMMUNITY): Payer: Medicaid Other

## 2021-11-12 ENCOUNTER — Telehealth (HOSPITAL_COMMUNITY): Payer: Self-pay | Admitting: Physical Therapy

## 2021-11-12 NOTE — Telephone Encounter (Signed)
Patient got an injection yesterday and she doesn;t feel like coming in today - please cx this apptment

## 2021-11-13 ENCOUNTER — Encounter (HOSPITAL_COMMUNITY): Payer: Medicaid Other | Admitting: Physical Therapy

## 2021-11-17 ENCOUNTER — Encounter (HOSPITAL_COMMUNITY): Payer: Medicaid Other | Admitting: Physical Therapy

## 2021-11-19 ENCOUNTER — Encounter (HOSPITAL_COMMUNITY): Payer: Self-pay

## 2021-11-19 ENCOUNTER — Other Ambulatory Visit: Payer: Self-pay

## 2021-11-19 ENCOUNTER — Ambulatory Visit (HOSPITAL_COMMUNITY): Payer: Medicaid Other | Attending: Physician Assistant

## 2021-11-19 DIAGNOSIS — M5416 Radiculopathy, lumbar region: Secondary | ICD-10-CM | POA: Insufficient documentation

## 2021-11-19 NOTE — Therapy (Signed)
[place] [address] [place], [place], [address] Phone: [phone]   Fax:  [phone]  Physical Therapy Treatment/Rre-Evaluation/Progress Note  Patient Details  Name: Robin Bender MRN: [id number] Date of Birth: 06-17-1976 Referring Provider (PT): Clemmie Krill  Progress Note Reporting Period 09/22/2021 to 11/19/2021  See note below for Objective Data and Assessment of Progress/Goals.     Encounter Date: 11/19/2021   PT End of Session - 11/19/21 0922     Visit Number 7    Number of Visits 12    Date for PT Re-Evaluation 12/17/21   re-eval 11/19/21   Authorization Type medicaid [employer]    Authorization - Visit Number 7    Authorization - Number of Visits 27    Progress Note Due on Visit 10    PT Start Time 0935    PT Stop Time 1018    PT Time Calculation (min) 43 min    Activity Tolerance Patient tolerated treatment well;Patient limited by pain    Behavior During Therapy WFL for tasks assessed/performed             Past Medical History:  Diagnosis Date   Anemia    Anxiety    Arthritis    Bipolar disorder ([place])    Depression    Fatigue    Fibromyalgia    Headache    Pneumonia due to COVID-19 virus 07/30/2020   [facility]   PTSD (post-traumatic stress disorder)    Sleep apnea     Past Surgical History:  Procedure Laterality Date   ABDOMINAL HYSTERECTOMY     APPENDECTOMY     CARPAL TUNNEL RELEASE Bilateral    CESAREAN SECTION  2002, 2004   DE QUERVAIN'S RELEASE Left 07/03/2020   DORSAL COMPARTMENT RELEASE Right 09/01/2020   Procedure: RIGHT 1ST DORSAL COMPARTMENT RELEASE;  Surgeon: Marybelle Killings, MD;  Location: [facility];  Service: Orthopedics;  Laterality: Right;   LAPAROSCOPIC APPENDECTOMY N/A 05/19/2014   Procedure: APPENDECTOMY LAPAROSCOPIC;  Surgeon: Jamesetta So, MD;  Location: [facility];  Service: General;  Laterality: N/A;   ovarian cyst removed     UMBILICAL  HERNIA REPAIR N/A 05/22/2021   Procedure: HERNIA REPAIR UMBILICAL ADULT W/MESH;  Surgeon: Virl Cagey, MD;  Location: [facility];  Service: General;  Laterality: N/A;   VULVECTOMY PARTIAL Left 02/04/2021   Procedure: VULVECTOMY PARTIAL;  Surgeon: Florian Buff, MD;  Location[facility];  Service: Gynecology;  Laterality: Left;    There were no vitals filed for this visit.   Subjective Assessment - 11/19/21 0921     Subjective Patient reports she has not done much with her weight loss or PT HEP over the holidays but now weights 290#. She infrequently has right pain down to her ankle that goes away sometimes after a few hours or less but not sure how or why. Tries to have others lift things from the floor for her. Uses paper plates and bowls so she doesn't have to do many dishes.    Pertinent History hip and knee pain, fibromyalgia,    Currently in Pain? Yes    Pain Score 4     Pain Location Hip    Pain Orientation Posterior;Right;Left    Pain Descriptors / Indicators Tightness;Sharp;Shooting    Pain Type Chronic pain    Pain Radiating Towards right knee    Pain Onset More than a month ago  [facility] PT Assessment - 11/19/21 0001       Assessment   Medical Diagnosis lumbar radiculopathy    Referring Provider (PT) Clemmie Krill    Onset Date/Surgical Date 06/23/21    Prior Therapy yes      Precautions   Precautions None      Restrictions   Weight Bearing Restrictions No      Functional Tests   Functional tests Single leg stance;Sit to Stand      Single Leg Stance   Comments RT: 23" Lt 34"   was Rt 2"; Lt 7"     Sit to Stand   Comments 7 in 30"   left knee crepitus noted; was 6     Posture/Postural Control   Posture/Postural Control Postural limitations    Postural Limitations Decreased lumbar lordosis;Decreased thoracic kyphosis    Posture Comments protruding abdominal mm      ROM / Strength   AROM / PROM / Strength AROM;Strength      AROM    AROM Assessment Site Lumbar    Lumbar Flexion 20   bilateral hip pain   Lumbar Extension 20   bilateral hip pain     Strength   Strength Assessment Site Hip;Knee;Ankle    Right/Left Hip Right;Left    Right Hip Flexion 4/5   was 3/5\   Right Hip Extension 3+/5   weas 3/5   Right Hip ABduction 4/5   was 4-/5   Left Hip Flexion 4+/5   was 4/5   Left Hip Extension 4-/5    Left Hip ABduction 4/5   was 4/5   Right Ankle Dorsiflexion 5/5   was 4+/5              [facility] Adult PT Treatment/Exercise - 11/19/21 0001       Self-Care   Self-Care Posture;Lifting    Lifting hips under centrer, ab set, spinal alignment    Posture extension biased exercises to decrease right radicular symptoms      Exercises   Exercises Lumbar      Lumbar Exercises: Stretches   Standing Extension 10 reps    Other Lumbar Stretch Exercise Reports weight loss to 290#      Lumbar Exercises: Standing   Heel Raises 10 reps    Heel Raises Limitations with 2# UE overhead lift    Lifting From floor;From 12";10 reps    Lifting Weights (lbs) --   8# box   Lifting Limitations reviewed mechanics    Row Strengthening;Both;Theraband;15 reps    Theraband Level (Row) Level 3 (Green)    Shoulder Extension Strengthening;Both;Theraband;15 reps    Theraband Level (Shoulder Extension) Level 3 Nyoka Cowden)               PT Education - 11/19/21 1031     Education Details Importance of extension biased exercises and anatomy and physiology involved. Importance of HEP and continued weight loss. Lifting mechanics.    Person(s) Educated Patient    Methods Explanation    Comprehension Verbalized understanding              PT Short Term Goals - 11/19/21 0923       PT SHORT TERM GOAL #1   Title PT to be I in HEP to allow pain level to be no greater than a 6/10    Baseline 11/19/21- Patient reports 7/10 greatest in the last week    Time 2    Period Weeks    Status On-going  Target Date 12/03/21      PT SHORT  TERM GOAL #2   Title PT radicular sx to be no further than the knee in Rt LE to demonstrate decreased nerve root irritation.    Baseline 11/19/21 - Patient received lumbar spine injection 11/11/21; a few times of an hour or two of pain past the right knee.    Time 2    Period Weeks    Status On-going      PT SHORT TERM GOAL #3   Title Pt to be able to demonstrate proper body mechanics for bed mobility and lifting.    Time 2    Period Weeks    Status On-going               PT Long Term Goals - 11/19/21 4709       PT LONG TERM GOAL #1   Title PT to be I in advance HEP to allow pain to be no greater than a 4/10    Baseline 11/19/21- Patient reports 7/10 greatest in the last week    Time 4    Period Weeks    Status On-going    Target Date 12/17/21      PT LONG TERM GOAL #2   Title PT hip and back ROM to improve to allow pt to easily pick items off the floor for housecleaning    Time 4    Period Weeks    Status On-going      PT LONG TERM GOAL #3   Title PT LE strength  to be increased one grade to decrease stress on back, pt to be ablet to come sit to stand 12 x in 30 seconds    Time 4    Period Weeks    Status On-going      PT LONG TERM GOAL #4   Title PT SLS to be 15" or greater on both LE to reduce risk of falling    Time 4    Period Weeks    Status On-going                   Plan - 11/19/21 6283     Clinical Impression Statement Re-evaluation performed reviewing objectives and goals. Patient met 0/3 short term and 0/4 long term goals. Patient did exhibit great improvements in functional strength for balance and manual muscle testing improvements, especially in right lower extremity. Hip extension strength continues to be lacking bilaterally. Patient also improve in 30 second sit to stand testing. Lumbar spine AROM remained the same. Patient reports pain down her leg is no longer constant but does come and go some. She reports leaning forward to try to get pain  down leg to go away. PT explained importance of extension and right side bending to decrease symptoms down the right leg. Sustained postural and lifting techniques have been reviewed with patient. Overall, patient is making progress but would benefit from continued skilled physical therapy to reduce impairment and improve function.    Personal Factors and Comorbidities Comorbidity 3+;Fitness;Time since onset of injury/illness/exacerbation;Past/Current Experience    Comorbidities hip and knee pain, fibromyalgia.    Examination-Activity Limitations Bathing;Bed Mobility;Bend;Caring for Others;Carry;Dressing;Lift;Locomotion Level;Sit;Squat;Stand    Examination-Participation Restrictions Cleaning;Community Activity;Driving;Laundry;Meal Prep;Occupation;Shop;Yard Work    [employer] officer Evolving/Moderate complexity    Rehab Potential Good    PT Frequency 2x / week    PT Duration 4 weeks    PT Treatment/Interventions ADLs/Self Care Home Management;Patient/family education;Manual techniques;Functional mobility training;Therapeutic  exercise;Gait training;Stair training;Therapeutic activities;Balance training;Neuromuscular re-education;Spinal Manipulations;Joint Manipulations    PT Next Visit Plan Continue with extension based exercises, continue with stabilization and LE strengthening as well as education in biomechanics for lifting.    PT Home Exercise Plan standing extension, POE, ab set; 12/20: heel raise and GTB shoulder extension and row    Consulted and Agree with Plan of Care Patient             Patient will benefit from skilled therapeutic intervention in order to improve the following deficits and impairments:  Decreased activity tolerance, Decreased balance, Decreased strength, Difficulty walking, Decreased range of motion, Pain  Visit Diagnosis: Radiculopathy, lumbar region     Problem List Patient Active Problem List   Diagnosis Date Noted   Umbilical hernia  without obstruction and without gangrene 05/05/2021   Vulvar hypertrophy    Stenosing tenosynovitis of wrist    Tendinitis, de Quervain's 08/21/2020   Labia minora hypertrophy 07/16/2020   Hyperlipidemia 03/26/2020   Family history of heart disease 03/26/2020   Tobacco abuse 03/26/2020   PTSD (post-traumatic stress disorder) 01/10/2017   Major depressive disorder, recurrent episode, moderate (HCC) 01/10/2017   Generalized anxiety disorder 01/10/2017   Obesity, morbid, BMI 40.0-49.9 ([place]) 11/19/2016   Degenerative lumbar disc 11/19/2016   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   Condyloma acuminatum of vulva 10/07/2016   Obstructive sleep apnea syndrome 04/04/2015   Arthritis of both knees 06/02/2014   Fibromyalgia 01/23/2014   Chronic bilateral low back pain without sciatica 01/23/2014   Breast pain 10/17/2012   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 Hartnett-Rands, MS, PT [facility] [place] [phone]  Jeannie Done, PT 11/19/2021, 10:42 AM  [place], [place], [address] Phone: [phone]   Fax:  [phone]  Name: Robin Bender MRN: [id number] Date of Birth: [birth year]

## 2021-11-24 ENCOUNTER — Ambulatory Visit (HOSPITAL_COMMUNITY): Payer: Medicaid Other | Admitting: Physical Therapy

## 2021-11-24 ENCOUNTER — Other Ambulatory Visit: Payer: Self-pay

## 2021-11-24 DIAGNOSIS — M5416 Radiculopathy, lumbar region: Secondary | ICD-10-CM | POA: Diagnosis not present

## 2021-11-24 NOTE — Therapy (Signed)
[place], [place], [address] Phone: [phone]   Fax:  [phone]  Physical Therapy Treatment  Patient Details  Name: Robin Bender MRN: [id number] Date of Birth: Aug 31, 1976 Referring Provider (PT): Clemmie Krill   Encounter Date: 11/24/2021   PT End of Session - 11/24/21 0956     Visit Number 8    Number of Visits 12    Date for PT Re-Evaluation 12/17/21   re-eval 11/19/21   Authorization Type medicaid [employer]    Authorization - Visit Number 8    Authorization - Number of Visits 27    Progress Note Due on Visit 10    PT Start Time 0920    PT Stop Time 1005    PT Time Calculation (min) 45 min    Activity Tolerance Patient tolerated treatment well;Patient limited by pain    Behavior During Therapy [facility] for tasks assessed/performed             Past Medical History:  Diagnosis Date   Anemia    Anxiety    Arthritis    Bipolar disorder ([place])    Depression    Fatigue    Fibromyalgia    Headache    Pneumonia due to COVID-19 virus 07/30/2020   [facility]   PTSD (post-traumatic stress disorder)    Sleep apnea     Past Surgical History:  Procedure Laterality Date   ABDOMINAL HYSTERECTOMY     APPENDECTOMY     CARPAL TUNNEL RELEASE Bilateral    CESAREAN SECTION  2002, 2004   DE QUERVAIN'S RELEASE Left 07/03/2020   DORSAL COMPARTMENT RELEASE Right 09/01/2020   Procedure: RIGHT 1ST DORSAL COMPARTMENT RELEASE;  Surgeon: Marybelle Killings, MD;  Location: [facility];  Service: Orthopedics;  Laterality: Right;   LAPAROSCOPIC APPENDECTOMY N/A 05/19/2014   Procedure: APPENDECTOMY LAPAROSCOPIC;  Surgeon: Jamesetta So, MD;  Location: [facility];  Service: General;  Laterality: N/A;   ovarian cyst removed     UMBILICAL HERNIA REPAIR N/A 05/22/2021   Procedure: HERNIA REPAIR UMBILICAL ADULT W/MESH;  Surgeon: Virl Cagey, MD;  Location: [facility];  Service: General;  Laterality:  N/A;   VULVECTOMY PARTIAL Left 02/04/2021   Procedure: VULVECTOMY PARTIAL;  Surgeon: Florian Buff, MD;  Location: [facility];  Service: Gynecology;  Laterality: Left;    There were no vitals filed for this visit.   Subjective Assessment - 11/24/21 0922     Subjective Pt states she had her injection a few weeks ago; today her paiin is a 5.    Pertinent History hip and knee pain, fibromyalgia,    Limitations Sitting;Lifting;Reading;Standing;Walking;House hold activities    How long can you sit comfortably? 10 minutes    How long can you stand comfortably? 30 minutes    How long can you walk comfortably? almost immediate pain but she can tolerate walking for 20-30 minutes    Currently in Pain? Yes    Pain Score 5     Pain Location Back    Pain Orientation Lower    Pain Descriptors / Indicators Aching    Pain Type Chronic pain    Pain Onset More than a month ago    Aggravating Factors  lifting    Pain Relieving Factors unknown                               [facility]  Adult PT Treatment/Exercise - 11/24/21 0001       Exercises   Exercises Lumbar      Lumbar Exercises: Stretches   Other Lumbar Stretch Exercise hip excursion x 3      Lumbar Exercises: Machines for Strengthening   Leg Press 3 Pl x 15      Lumbar Exercises: Standing   Heel Raises 15 reps    Heel Raises Limitations with 2# UE overhead lift   combined with mini squat   Functional Squats 15 reps    Scapular Retraction Strengthening;10 reps;Both    Row Strengthening;Both;Theraband;15 reps    Theraband Level (Row) Level 3 (Green)    Shoulder Extension Strengthening;Both;Theraband;15 reps    Theraband Level (Shoulder Extension) Level 3 (Green)    Other Standing Lumbar Exercises push up position at wall tap hand to opposite shoulder x 10 , marching with bands at thighs x wto    Other Standing Lumbar Exercises back against wall slow B shoulder flexion, Palofff x 10; PNF 1 and 2 B x 10                        PT Short Term Goals - 11/19/21 [id number]       PT SHORT TERM GOAL #1   Title PT to be I in HEP to allow pain level to be no greater than a 6/10    Baseline 11/19/21- Patient reports 7/10 greatest in the last week    Time 2    Period Weeks    Status On-going    Target Date 12/03/21      PT SHORT TERM GOAL #2   Title PT radicular sx to be no further than the knee in Rt LE to demonstrate decreased nerve root irritation.    Baseline 11/19/21 - Patient received lumbar spine injection 11/11/21; a few times of an hour or two of pain past the right knee.    Time 2    Period Weeks    Status On-going      PT SHORT TERM GOAL #3   Title Pt to be able to demonstrate proper body mechanics for bed mobility and lifting.    Time 2    Period Weeks    Status On-going               PT Long Term Goals - 11/19/21 [id number]       PT LONG TERM GOAL #1   Title PT to be I in advance HEP to allow pain to be no greater than a 4/10    Baseline 11/19/21- Patient reports 7/10 greatest in the last week    Time 4    Period Weeks    Status On-going    Target Date 12/17/21      PT LONG TERM GOAL #2   Title PT hip and back ROM to improve to allow pt to easily pick items off the floor for housecleaning    Time 4    Period Weeks    Status On-going      PT LONG TERM GOAL #3   Title PT LE strength  to be increased one grade to decrease stress on back, pt to be ablet to come sit to stand 12 x in 30 seconds    Time 4    Period Weeks    Status On-going      PT LONG TERM GOAL #4   Title PT SLS to be 15" or greater on  both LE to reduce risk of falling    Time 4    Period Weeks    Status On-going                   Plan - 11/24/21 0957     Clinical Impression Statement Progressed pt with tband exercises and standing fuctional exercises with good form following verbal cuing.  PT is not completing her exercises on a regular basis therefore no additional HEP was given.    Personal  Factors and Comorbidities Comorbidity 3+;Fitness;Time since onset of injury/illness/exacerbation;Past/Current Experience    Comorbidities hip and knee pain, fibromyalgia.    Examination-Activity Limitations Bathing;Bed Mobility;Bend;Caring for Others;Carry;Dressing;Lift;Locomotion Level;Sit;Squat;Stand    Examination-Participation Restrictions Cleaning;Community Activity;Driving;Laundry;Meal Prep;Occupation;Shop;Yard Work    [employer] officer Evolving/Moderate complexity    Rehab Potential Good    PT Frequency 2x / week    PT Duration 4 weeks    PT Treatment/Interventions ADLs/Self Care Home Management;Patient/family education;Manual techniques;Functional mobility training;Therapeutic exercise;Gait training;Stair training;Therapeutic activities;Balance training;Neuromuscular re-education;Spinal Manipulations;Joint Manipulations    PT Next Visit Plan Continue with extension based exercises, continue with stabilization and LE strengthening as well as education in biomechanics for lifting.  Add tband Paloff and PNF to HEP if pt is being more compliant.    PT Home Exercise Plan standing extension, POE, ab set; 12/20: heel raise and GTB shoulder extension and row    Consulted and Agree with Plan of Care Patient             Patient will benefit from skilled therapeutic intervention in order to improve the following deficits and impairments:  Decreased activity tolerance, Decreased balance, Decreased strength, Difficulty walking, Decreased range of motion, Pain  Visit Diagnosis: Radiculopathy, lumbar region     Problem List Patient Active Problem List   Diagnosis Date Noted   Umbilical hernia without obstruction and without gangrene 05/05/2021   Vulvar hypertrophy    Stenosing tenosynovitis of wrist    Tendinitis, de Quervain's 08/21/2020   Labia minora hypertrophy 07/16/2020   Hyperlipidemia 03/26/2020   Family history of heart disease 03/26/2020   Tobacco abuse  03/26/2020   PTSD (post-traumatic stress disorder) 01/10/2017   Major depressive disorder, recurrent episode, moderate (HCC) 01/10/2017   Generalized anxiety disorder 01/10/2017   Obesity, morbid, BMI 40.0-49.9 ([place]) 11/19/2016   Degenerative lumbar disc 11/19/2016   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

## 2021-12-01 ENCOUNTER — Encounter (HOSPITAL_COMMUNITY): Payer: Medicaid Other | Admitting: Physical Therapy

## 2021-12-03 ENCOUNTER — Other Ambulatory Visit: Payer: Self-pay

## 2021-12-03 ENCOUNTER — Ambulatory Visit (HOSPITAL_COMMUNITY): Payer: Medicaid Other | Admitting: Physical Therapy

## 2021-12-03 DIAGNOSIS — M5416 Radiculopathy, lumbar region: Secondary | ICD-10-CM

## 2021-12-03 NOTE — Therapy (Signed)
[facility] [address] [place], [place], [address] Phone: [phone]   Fax:  [phone]  Physical Therapy Treatment  Patient Details  Name: Robin SignsBrandi I Bender MRN: [id number] Date of Birth: [birth year] Referring Provider (PT): Wayland DenisKatherine Skillman   Encounter Date: 12/03/2021   PT End of Session - 12/03/21 0903     Visit Number 9    Number of Visits 17    Date for PT Re-Evaluation 01/05/22   re-eval 11/19/21   Authorization Type medicaid [employer]; progress note completed visit #7 (no authorization but max of 27 visits combined for year)    Authorization - Visit Number 9    Authorization - Number of Visits 27    Progress Note Due on Visit 17    PT Start Time 0825    PT Stop Time 0905    PT Time Calculation (min) 40 min    Activity Tolerance Patient tolerated treatment well;Patient limited by pain    Behavior During Therapy [facility] for tasks assessed/performed             Past Medical History:  Diagnosis Date   Anemia    Anxiety    Arthritis    Bipolar disorder (HCC)    Depression    Fatigue    Fibromyalgia    Headache    Pneumonia due to COVID-19 virus 07/30/2020   [facility]   PTSD (post-traumatic stress disorder)    Sleep apnea     Past Surgical History:  Procedure Laterality Date   ABDOMINAL HYSTERECTOMY     APPENDECTOMY     CARPAL TUNNEL RELEASE Bilateral    CESAREAN SECTION  2002, 2004   DE QUERVAIN'S RELEASE Left 07/03/2020   DORSAL COMPARTMENT RELEASE Right 09/01/2020   Procedure: RIGHT 1ST DORSAL COMPARTMENT RELEASE;  Surgeon: Eldred MangesYates, Mark C, MD;  Location: [facility];  Service: Orthopedics;  Laterality: Right;   LAPAROSCOPIC APPENDECTOMY N/A 05/19/2014   Procedure: APPENDECTOMY LAPAROSCOPIC;  Surgeon: Dalia HeadingMark A Jenkins, MD;  Location: [facility];  Service: General;  Laterality: N/A;   ovarian cyst removed     UMBILICAL HERNIA REPAIR N/A 05/22/2021   Procedure: HERNIA REPAIR UMBILICAL ADULT  W/MESH;  Surgeon: Lucretia RoersBridges, Lindsay C, MD;  Location: [facility];  Service: General;  Laterality: N/A;   VULVECTOMY PARTIAL Left 02/04/2021   Procedure: VULVECTOMY PARTIAL;  Surgeon: Lazaro ArmsEure, Luther H, MD;  Location: [facility];  Service: Gynecology;  Laterality: Left;    There were no vitals filed for this visit.   Subjective Assessment - 12/03/21 0831     Subjective Pt states she has no pain, just stiffness.  States she usually hurts at end of therapy session but tends to go away as the day progressess.    Currently in Pain? No/denies                               [facility] Adult PT Treatment/Exercise - 12/03/21 0001       Lumbar Exercises: Stretches   Other Lumbar Stretch Exercise hip excursion x 3      Lumbar Exercises: Machines for Strengthening   Leg Press 3 Pl 2 sets 15 reps      Lumbar Exercises: Standing   Heel Raises 15 reps    Heel Raises Limitations with 2# UE overhead lift    Functional Squats 15 reps    Forward Lunge 15 reps;Limitations    Forward Lunge Limitations onto  4" step no UE's    Scapular Retraction Strengthening;Both;15 reps    Theraband Level (Scapular Retraction) Level 3 (Green)    Scapular Retraction Limitations 2 sets of 15    Row Strengthening;Both;Theraband;15 reps    Theraband Level (Row) Level 3 (Green)    Row Limitations 2 sets of 15    Shoulder Extension Strengthening;Both;Theraband;15 reps    Theraband Level (Shoulder Extension) Level 3 (Green)    Shoulder Extension Limitations 2 sets of 15    Other Standing Lumbar Exercises hip abduction 10X, extension 10X each, vectors 10X5" each LE with 1 HHA    Other Standing Lumbar Exercises back against wall slow B shoulder flexion with 2# 15 reps, Paloff x 15 GTB, wall arch 10X                       PT Short Term Goals - 11/19/21 [id number]       PT SHORT TERM GOAL #1   Title PT to be I in HEP to allow pain level to be no greater than a 6/10    Baseline 11/19/21- Patient reports 7/10  greatest in the last week    Time 2    Period Weeks    Status On-going    Target Date 12/03/21      PT SHORT TERM GOAL #2   Title PT radicular sx to be no further than the knee in Rt LE to demonstrate decreased nerve root irritation.    Baseline 11/19/21 - Patient received lumbar spine injection 11/11/21; a few times of an hour or two of pain past the right knee.    Time 2    Period Weeks    Status On-going      PT SHORT TERM GOAL #3   Title Pt to be able to demonstrate proper body mechanics for bed mobility and lifting.    Time 2    Period Weeks    Status On-going               PT Long Term Goals - 11/19/21 [id number]       PT LONG TERM GOAL #1   Title PT to be I in advance HEP to allow pain to be no greater than a 4/10    Baseline 11/19/21- Patient reports 7/10 greatest in the last week    Time 4    Period Weeks    Status On-going    Target Date 12/17/21      PT LONG TERM GOAL #2   Title PT hip and back ROM to improve to allow pt to easily pick items off the floor for housecleaning    Time 4    Period Weeks    Status On-going      PT LONG TERM GOAL #3   Title PT LE strength  to be increased one grade to decrease stress on back, pt to be ablet to come sit to stand 12 x in 30 seconds    Time 4    Period Weeks    Status On-going      PT LONG TERM GOAL #4   Title PT SLS to be 15" or greater on both LE to reduce risk of falling    Time 4    Period Weeks    Status On-going                   Plan - 12/03/21 0950     Clinical Impression Statement Pt still  will stiffness and general deconditioning; prolonged sitting and standing aggravates her symptoms.  Continued with postural/LE strengthening and general education.  Noted hip weakness on 1/12 PN so incorporated hip strengthening into session today.  Pt with cramping completing hip abduction with need to rest between.  Vectors also added to engage glute mm with cues for increasing hold times and general  posturing.    Personal Factors and Comorbidities Comorbidity 3+;Fitness;Time since onset of injury/illness/exacerbation;Past/Current Experience    Comorbidities hip and knee pain, fibromyalgia.    Examination-Activity Limitations Bathing;Bed Mobility;Bend;Caring for Others;Carry;Dressing;Lift;Locomotion Level;Sit;Squat;Stand    Examination-Participation Restrictions Cleaning;Community Activity;Driving;Laundry;Meal Prep;Occupation;Shop;Yard Work    [employer] officer Evolving/Moderate complexity    Rehab Potential Good    PT Frequency 2x / week    PT Duration 4 weeks    PT Treatment/Interventions ADLs/Self Care Home Management;Patient/family education;Manual techniques;Functional mobility training;Therapeutic exercise;Gait training;Stair training;Therapeutic activities;Balance training;Neuromuscular re-education;Spinal Manipulations;Joint Manipulations    PT Next Visit Plan Continue with extension based exercises, continue with stabilization and LE strengthening as well as education in biomechanics for lifting.    PT Home Exercise Plan standing extension, POE, ab set; 12/20: heel raise and GTB shoulder extension and row    Consulted and Agree with Plan of Care Patient             Patient will benefit from skilled therapeutic intervention in order to improve the following deficits and impairments:  Decreased activity tolerance, Decreased balance, Decreased strength, Difficulty walking, Decreased range of motion, Pain  Visit Diagnosis: Radiculopathy, lumbar region     Problem List Patient Active Problem List   Diagnosis Date Noted   Umbilical hernia without obstruction and without gangrene 05/05/2021   Vulvar hypertrophy    Stenosing tenosynovitis of wrist    Tendinitis, de Quervain's 08/21/2020   Labia minora hypertrophy 07/16/2020   Hyperlipidemia 03/26/2020   Family history of heart disease 03/26/2020   Tobacco abuse 03/26/2020   PTSD (post-traumatic stress  disorder) 01/10/2017   Major depressive disorder, recurrent episode, moderate (HCC) 01/10/2017   Generalized anxiety disorder 01/10/2017   Obesity, morbid, BMI 40.0-49.9 ([place]) 11/19/2016   Degenerative lumbar disc 11/19/2016   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   Condyloma acuminatum of vulva 10/07/2016   Obstructive sleep apnea syndrome 04/04/2015   Arthritis of both knees 06/02/2014   Fibromyalgia 01/23/2014   Chronic bilateral low back pain without sciatica 01/23/2014   Breast pain 10/17/2012   PATELLO-FEMORAL SYNDROME 03/03/2009   Teena Irani, PTA/CLT, WTA [phone]  Teena Irani, PTA 12/03/2021, 9:51 AM  [facility] [address] [place], [place], [address] Phone: [phone]   Fax:  [phone]  Name: Robin Bender MRN: [id number] Date of Birth: May 03, 1976

## 2021-12-08 ENCOUNTER — Ambulatory Visit (HOSPITAL_COMMUNITY): Payer: Medicaid Other | Admitting: Physical Therapy

## 2021-12-15 ENCOUNTER — Encounter (HOSPITAL_COMMUNITY): Payer: Self-pay

## 2021-12-16 ENCOUNTER — Telehealth (HOSPITAL_COMMUNITY): Payer: Self-pay | Admitting: Physical Therapy

## 2021-12-16 ENCOUNTER — Other Ambulatory Visit: Payer: Self-pay | Admitting: Obstetrics & Gynecology

## 2021-12-16 NOTE — Telephone Encounter (Signed)
She is sick and wanted to cx - didn't want to r/s - she will do that when she comes on 2/14.

## 2021-12-17 ENCOUNTER — Encounter (HOSPITAL_COMMUNITY): Payer: Self-pay | Admitting: Physical Therapy

## 2021-12-22 ENCOUNTER — Other Ambulatory Visit: Payer: Self-pay

## 2021-12-22 ENCOUNTER — Encounter (HOSPITAL_COMMUNITY): Payer: Self-pay

## 2021-12-22 ENCOUNTER — Ambulatory Visit (HOSPITAL_COMMUNITY): Payer: Medicaid Other | Attending: Physician Assistant

## 2021-12-22 DIAGNOSIS — M5416 Radiculopathy, lumbar region: Secondary | ICD-10-CM | POA: Diagnosis not present

## 2021-12-22 NOTE — Therapy (Signed)
[place], [place], [address] Phone: [phone]   Fax:  [phone]  Physical Therapy Treatment  Patient Details  Name: Robin Bender MRN: [id number] Date of Birth: 02-02-1976 Referring Provider (PT): Clemmie Krill   Encounter Date: 12/22/2021   PT End of Session - 12/22/21 0918     Visit Number 10    Number of Visits 17    Date for PT Re-Evaluation 01/05/22    Authorization Type medicaid [employer]; progress note completed visit #7 (no authorization but max of 27 visits combind for year)    Authorization - Visit Number 10    Authorization - Number of Visits 27    Progress Note Due on Visit 17    PT Start Time 0905    PT Stop Time 0945    PT Time Calculation (min) 40 min    Activity Tolerance Patient tolerated treatment well;Patient limited by pain    Behavior During Therapy [facility] for tasks assessed/performed             Past Medical History:  Diagnosis Date   Anemia    Anxiety    Arthritis    Bipolar disorder ([place])    Depression    Fatigue    Fibromyalgia    Headache    Pneumonia due to COVID-19 virus 07/30/2020   [facility]   PTSD (post-traumatic stress disorder)    Sleep apnea     Past Surgical History:  Procedure Laterality Date   ABDOMINAL HYSTERECTOMY     APPENDECTOMY     CARPAL TUNNEL RELEASE Bilateral    CESAREAN SECTION  2002, 2004   DE QUERVAIN'S RELEASE Left 07/03/2020   DORSAL COMPARTMENT RELEASE Right 09/01/2020   Procedure: RIGHT 1ST DORSAL COMPARTMENT RELEASE;  Surgeon: Marybelle Killings, MD;  Location: [place];  Service: Orthopedics;  Laterality: Right;   LAPAROSCOPIC APPENDECTOMY N/A 05/19/2014   Procedure: APPENDECTOMY LAPAROSCOPIC;  Surgeon: Jamesetta So, MD;  Location: [facility];  Service: General;  Laterality: N/A;   ovarian cyst removed     UMBILICAL HERNIA REPAIR N/A 05/22/2021   Procedure: HERNIA REPAIR UMBILICAL ADULT W/MESH;  Surgeon:  Virl Cagey, MD;  Location: [facility];  Service: General;  Laterality: N/A;   VULVECTOMY PARTIAL Left 02/04/2021   Procedure: VULVECTOMY PARTIAL;  Surgeon: Florian Buff, MD;  Location: [facility];  Service: Gynecology;  Laterality: Left;    There were no vitals filed for this visit.   Subjective Assessment - 12/22/21 0944     Subjective Pt arrives for today's treatment session reporting 6/10 low back and right hip pain.    Pertinent History hip and knee pain, fibromyalgia    Limitations Sitting;Lifting;Reading;Standing;House hold activities    How long can you sit comfortably? 10 minutes    How long can you stand comfortably? 30 minutes    How long can you walk comfortably? almost immediate pain but she can tolerate walking for 20-30 minutes    Currently in Pain? Yes    Pain Score 6     Pain Location Back    Pain Orientation Right;Lower    Pain Descriptors / Indicators Aching    Pain Type Chronic pain    Pain Onset More than a month ago    Pain Frequency Constant    Aggravating Factors  lifting    Pain Relieving Factors unknown    Effect of Pain on Daily Activities limits; will wake up when she  turns over, children help with housework                               [facility] Adult PT Treatment/Exercise - 12/22/21 0001       Lumbar Exercises: Machines for Strengthening   Leg Press 3 Pl 2 sets 15 reps      Lumbar Exercises: Standing   Heel Raises 15 reps    Heel Raises Limitations with 2# UE overhead lift    Functional Squats 15 reps    Forward Lunge 15 reps;Limitations    Forward Lunge Limitations onto 4" step no UE    Other Standing Lumbar Exercises hip abduction 15x; hip extension 15x    Other Standing Lumbar Exercises Foward step ups 15 reps 4" step      Manual Therapy   Manual Therapy Soft tissue mobilization    Soft tissue mobilization STW/M to right SI joint region to decrease pain and tone with pt left side-lying                        PT Short Term Goals - 12/22/21 0924       PT SHORT TERM GOAL #1   Title PT to be I in HEP to allow pain level to be no greater than a 6/10    Baseline 11/19/21- Patient reports 7/10 greatest in the last week    Time 2    Period Weeks    Status On-going    Target Date 12/03/21      PT SHORT TERM GOAL #2   Title PT radicular sx to be no further than the knee in Rt LE to demonstrate decreased nerve root irritation.    Baseline 11/19/21 - Patient received lumbar spine injection 11/11/21; a few times of an hour or two of pain past the right knee.    Time 2    Period Weeks    Status On-going      PT SHORT TERM GOAL #3   Title Pt to be able to demonstrate proper body mechanics for bed mobility and lifting.    Time 2    Period Weeks    Status On-going               PT Long Term Goals - 12/22/21 0911       PT LONG TERM GOAL #1   Title PT to be I in advance HEP to allow pain to be no greater than a 4/10    Baseline 11/19/21- Patient reports 7/10 greatest in the last week    Time 4    Period Weeks    Status On-going    Target Date 12/17/21      PT LONG TERM GOAL #2   Title PT hip and back ROM to improve to allow pt to easily pick items off the floor for housecleaning    Time 4    Period Weeks    Status On-going      PT LONG TERM GOAL #3   Title PT LE strength  to be increased one grade to decrease stress on back, pt to be ablet to come sit to stand 12 x in 30 seconds    Baseline 12/22/21: 12 STS in 35 secs    Time 4    Status On-going      PT LONG TERM GOAL #4   Title PT SLS to be 15" or greater  on both LE to reduce risk of falling    Baseline 12/22/21: 15 secs on BLE    Time 4    Period Weeks    Status Achieved                   Plan - 12/22/21 0919     Clinical Impression Statement Pt arrives for today's treatment session reporting 6/10 low back and right hip pain.  Pt able to demonstreat 15 sec SLS on B LE during today's session  meeting that goal.  Pt able to perform 12 STS in 35 secs which has improved since initial eval.  Pt requiring standing rest breaks due to low back pain during several standing exercises.  STW/M performed to right SI joint region with pt in left side-lying to decrease pain and tone.  Pt reports 5/10 low back pain at completion of today's treatment session.    Personal Factors and Comorbidities Comorbidity 3+;Fitness;Time since onset of injury/illness/exacerbation;Past/Current Experience    Comorbidities hip and knee pain, fibromyalgia    Examination-Activity Limitations Bathing;Bed Mobility;Bend;Caring for Others;Carry;Dressing;Lift;Locomotion Level;Sit;Squat;Stand    Examination-Participation Restrictions Cleaning;Community Activity;Driving;Laundry;Meal Prep;Occupation;Shop;Yard Work    [employer] Evolving/Moderate complexity    Clinical Decision Making Moderate    Rehab Potential Good    PT Frequency 2x / week    PT Duration 4 weeks    PT Treatment/Interventions ADLs/Self Care Home Management;Patient/family education;Manual techniques;Functional mobility training;Therapeutic exercise;Gait training;Stair training;Therapeutic activities;Balance training;Neuromuscular re-education;Spinal Manipulations;Joint Manipulations    PT Next Visit Plan Continue with extension based exercises, continue with stabilization and LE strengthening as well as education in biomechanics for lifting    PT Home Exercise Plan standing extension, POE, ab set; 12/20: heel raises and GTB shouder extension and row    Consulted and Agree with Plan of Care Patient             Patient will benefit from skilled therapeutic intervention in order to improve the following deficits and impairments:  Decreased activity tolerance, Decreased balance, Decreased strength, Difficulty walking, Decreased range of motion, Pain  Visit Diagnosis: Radiculopathy, lumbar region     Problem List Patient Active  Problem List   Diagnosis Date Noted   Umbilical hernia without obstruction and without gangrene 05/05/2021   Vulvar hypertrophy    Stenosing tenosynovitis of wrist    Tendinitis, de Quervain's 08/21/2020   Labia minora hypertrophy 07/16/2020   Hyperlipidemia 03/26/2020   Family history of heart disease 03/26/2020   Tobacco abuse 03/26/2020   PTSD (post-traumatic stress disorder) 01/10/2017   Major depressive disorder, recurrent episode, moderate (HCC) 01/10/2017   Generalized anxiety disorder 01/10/2017   Obesity, morbid, BMI 40.0-49.9 ([place]) 11/19/2016   Degenerative lumbar disc 11/19/2016   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 [place], [place], [address] Phone: [phone]   Fax:  [phone]  Name: Robin Bender MRN: [id number] Date of Birth: [birth year]

## 2022-01-11 ENCOUNTER — Other Ambulatory Visit: Payer: Self-pay

## 2022-01-11 ENCOUNTER — Ambulatory Visit: Payer: Medicaid Other | Admitting: Adult Health

## 2022-01-11 ENCOUNTER — Encounter: Payer: Self-pay | Admitting: Adult Health

## 2022-01-11 VITALS — BP 105/69 | HR 90 | Ht 64.0 in | Wt 300.0 lb

## 2022-01-11 DIAGNOSIS — N390 Urinary tract infection, site not specified: Secondary | ICD-10-CM | POA: Diagnosis not present

## 2022-01-11 DIAGNOSIS — Z90710 Acquired absence of both cervix and uterus: Secondary | ICD-10-CM | POA: Diagnosis not present

## 2022-01-11 DIAGNOSIS — R102 Pelvic and perineal pain: Secondary | ICD-10-CM | POA: Insufficient documentation

## 2022-01-11 DIAGNOSIS — Z90721 Acquired absence of ovaries, unilateral: Secondary | ICD-10-CM

## 2022-01-11 NOTE — Progress Notes (Signed)
?  Subjective:  ?  ? Patient ID: Robin Bender, female   DOB: June 11, 1976, 46 y.o.   MRN: [id number] ? ?HPI ?Robin Bender is a 46 year old white female, with DP, G2P2, sp hysterectomy, in complaining of bladder pain, and on the way here was called by PCP and has UTI and antibiotic was sent for her. ?PCP is [employer] [facility].  ? ?Review of Systems ?Has had pain over bladder area for a week ?Had sharp pain in urethra at times ?Has positional pain with sex ?Reviewed past medical,surgical, social and family history. Reviewed medications and allergies.  ?   ?Objective:  ? Physical Exam ?BP 105/69 (BP Location: Right Arm, Patient Position: Sitting, Cuff Size: Normal)   Pulse 90   Ht 5\' 4"  (1.626 m)   Wt 300 lb (136.1 kg)   BMI 51.49 kg/m?   ?  Skin warm and dry.Pelvic: external genitalia is normal in appearance no lesions, vagina: pale pink,urethra has no lesions or masses noted, cervix and uterus are absent, adnexa: no masses or tenderness noted. Bladder is mildly  tender and no masses felt. No CVAT ? [place] - 01/11/22 1239   ? ?  ? Pregnancy Intention Screening  ? Does the patient want to become pregnant in the next year? N/A   ? Does the patient's partner want to become pregnant in the next year? N/A   ? Would the patient like to discuss contraceptive options today? N/A   ?  ? Contraception Wrap Up  ? Current Method --   hyst  ? End Method --   hyst  ? Contraception Counseling Provided No   ? ?  ?  ? ?  ? Examination chaperoned by 03/13/22 LPN ? ?Assessment:  ?   ?1. Urinary tract infection without hematuria, site unspecified ?Was called in way to office by PCP, has UTI, and antibiotic sent in ? ?2. Suprapubic pain ?Bladder mildly tender, if persists, please call ? ?  3. Sp hysterectomy in 2009. ?She is on oral estrogen  ? ?Plan:  ?   ?Follow up prn  ?   ?

## 2022-04-01 ENCOUNTER — Other Ambulatory Visit: Payer: Self-pay | Admitting: Sports Medicine

## 2022-04-01 DIAGNOSIS — S83289A Other tear of lateral meniscus, current injury, unspecified knee, initial encounter: Secondary | ICD-10-CM

## 2022-04-17 ENCOUNTER — Other Ambulatory Visit: Payer: Medicaid Other

## 2022-04-27 ENCOUNTER — Ambulatory Visit
Admission: RE | Admit: 2022-04-27 | Discharge: 2022-04-27 | Disposition: A | Discharge status: Home / Self Care | Payer: Medicaid Other | Source: Ambulatory Visit | Attending: Sports Medicine | Admitting: Sports Medicine

## 2022-04-27 DIAGNOSIS — S83289A Other tear of lateral meniscus, current injury, unspecified knee, initial encounter: Secondary | ICD-10-CM

## 2022-05-06 ENCOUNTER — Ambulatory Visit: Payer: Medicaid Other | Admitting: Orthopaedic Surgery

## 2022-05-06 ENCOUNTER — Encounter: Payer: Self-pay | Admitting: Orthopaedic Surgery

## 2022-05-06 VITALS — Ht 64.0 in | Wt 306.0 lb

## 2022-05-06 DIAGNOSIS — M65311 Trigger thumb, right thumb: Secondary | ICD-10-CM

## 2022-05-06 DIAGNOSIS — M170 Bilateral primary osteoarthritis of knee: Secondary | ICD-10-CM | POA: Diagnosis not present

## 2022-05-10 DIAGNOSIS — M65311 Trigger thumb, right thumb: Secondary | ICD-10-CM | POA: Diagnosis not present

## 2022-05-10 MED ORDER — METHYLPREDNISOLONE ACETATE 40 MG/ML IJ SUSP
40.0000 mg | INTRAMUSCULAR | Status: AC | PRN
  Administered 2022-05-10: 40 mg

## 2022-05-10 MED ORDER — BUPIVACAINE HCL 0.25 % IJ SOLN
1.0000 mL | INTRAMUSCULAR | Status: AC | PRN
Start: 2022-05-10 — End: 2022-05-10
  Administered 2022-05-10: 1 mL

## 2022-05-10 MED ORDER — LIDOCAINE HCL 1 % IJ SOLN
1.0000 mL | INTRAMUSCULAR | Status: AC | PRN
  Administered 2022-05-10: 1 mL

## 2022-05-10 NOTE — Progress Notes (Signed)
Office Visit Note   Patient: Robin Bender           Date of Birth: 02-01-1976           MRN: [id number] Visit Date: 05/06/2022              Requested by: Royann Shivers, PA-C [address] [place],  [place] [address] PCP: Royann Shivers, PA-C   Assessment & Plan: Visit Diagnoses: 1.  Right trigger thumb  2.  Left knee severe patellofemoral arthritis   Plan: Trigger thumb injection performed pathophysiology discussed we reviewed findings of her knee MRI scan and plain radiographs.  We discussed activities that are less likely to bother the patellofemoral joint such as riding an exercise bike.  She has severe pain if she tries to do the [employer].  We discussed pool exercises water aerobics which should be easier on a patellofemoral joint.  Trigger thumb release discussed if she has persistent problems.  Follow-Up Instructions: No follow-ups on file.   Orders:  No orders of the defined types were placed in this encounter.  No orders of the defined types were placed in this encounter.     Procedures: Hand/UE Inj: R thumb A1 for trigger finger on 05/10/2022 11:17 AM Medications: 1 mL lidocaine 1 %; 1 mL bupivacaine 0.25 %; 40 mg methylPREDNISolone acetate 40 MG/ML      Clinical Data: No additional findings.   Subjective: Chief Complaint  Patient presents with   Left Knee - Pain    MRI results   Right Thumb - Numbness    Worse over past 2 wks.    HPI 46 year old female returns post MRI left shoulder.  MRI shows small joint effusion.  Full-thickness cartilage defect on the lateral patella facet.  Menisci were intact and medial lateral compartment did not show significant wear.  MRI report and images reviewed with patient.  Patient is also had 2-week history of right thumb locking and inability to extend sometimes she has to manually reduce the finger.  Patient also has history of sleep apnea PTSD depression tobacco abuse chronic pain medicine.  Norco 5/325  tablets 120 tablets monthly.  Review of Systems all other systems noncontributory to HPI.   Objective: Vital Signs: Ht 5\' 4"  (1.626 m)   Wt (!) 306 lb (138.8 kg)   BMI 52.52 kg/m   Physical Exam Constitutional:      Appearance: She is well-developed.  HENT:     Head: Normocephalic.     Right Ear: External ear normal.     Left Ear: External ear normal. There is no impacted cerumen.  Eyes:     Pupils: Pupils are equal, round, and reactive to light.  Neck:     Thyroid: No thyromegaly.     Trachea: No tracheal deviation.  Cardiovascular:     Rate and Rhythm: Normal rate.  Pulmonary:     Effort: Pulmonary effort is normal.  Abdominal:     Palpations: Abdomen is soft.  Musculoskeletal:     Cervical back: No rigidity.  Skin:    General: Skin is warm and dry.  Neurological:     Mental Status: She is alert and oriented to person, place, and time.  Psychiatric:        Behavior: Behavior normal.     Ortho Exam severe tenderness right A1 pulley of the thumb is able demonstrate active triggering.  Crepitus knee range of motion positive patellofemoral grind test.  Collateral ligaments are stable.  Negative logroll the hips.  Specialty Comments:  No specialty comments available.  Imaging: No results found.   PMFS History: Patient Active Problem List   Diagnosis Date Noted   Trigger thumb, right thumb 05/10/2022   Suprapubic pain 01/11/2022   Urinary tract infection without hematuria 01/11/2022   Umbilical hernia without obstruction and without gangrene 05/05/2021   Vulvar hypertrophy    Stenosing tenosynovitis of wrist    Tendinitis, de Quervain's 08/21/2020   Labia minora hypertrophy 07/16/2020   Hyperlipidemia 03/26/2020   Family history of heart disease 03/26/2020   Tobacco abuse 03/26/2020   PTSD (post-traumatic stress disorder) 01/10/2017   Major depressive disorder, recurrent episode, moderate (HCC) 01/10/2017   Generalized anxiety disorder 01/10/2017    Obesity, morbid, BMI 40.0-49.9 (HCC) 11/19/2016   Degenerative lumbar disc 11/19/2016   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   Condyloma acuminatum of vulva 10/07/2016   Obstructive sleep apnea syndrome 04/04/2015   Arthritis of both knees 06/02/2014   Fibromyalgia 01/23/2014   Chronic bilateral low back pain without sciatica 01/23/2014   Breast pain 10/17/2012   PATELLO-FEMORAL SYNDROME 03/03/2009   Past Medical History:  Diagnosis Date   Anemia    Anxiety    Arthritis    Bipolar disorder (HCC)    Depression    Fatigue    Fibromyalgia    Headache    Pneumonia due to COVID-19 virus 07/30/2020   [facility]   PTSD (post-traumatic stress disorder)    Sleep apnea     Family History  Problem Relation Age of Onset   Cancer Mother        cervical   Hypertension Mother    Heart disease Mother    Arthritis/Rheumatoid Mother     Past Surgical History:  Procedure Laterality Date   ABDOMINAL HYSTERECTOMY     APPENDECTOMY     CARPAL TUNNEL RELEASE Bilateral    CESAREAN SECTION  2002, 2004   DE QUERVAIN'S RELEASE Left 07/03/2020   DORSAL COMPARTMENT RELEASE Right 09/01/2020   Procedure: RIGHT 1ST DORSAL COMPARTMENT RELEASE;  Surgeon: Eldred Manges, MD;  Location: [facility];  Service: Orthopedics;  Laterality: Right;   LAPAROSCOPIC APPENDECTOMY N/A 05/19/2014   Procedure: APPENDECTOMY LAPAROSCOPIC;  Surgeon: Dalia Heading, MD;  Location: [facility];  Service: General;  Laterality: N/A;   ovarian cyst removed     UMBILICAL HERNIA REPAIR N/A 05/22/2021   Procedure: HERNIA REPAIR UMBILICAL ADULT W/MESH;  Surgeon: Lucretia Roers, MD;  Location: [facility];  Service: General;  Laterality: N/A;   VULVECTOMY PARTIAL Left 02/04/2021   Procedure: VULVECTOMY PARTIAL;  Surgeon: Lazaro Arms, MD;  Location: [facility];  Service: Gynecology;  Laterality: Left;   Social History   Occupational History   Not on file  Tobacco Use   Smoking status: Former    Packs/day: 0.25     Years: 22.00    Total pack years: 5.50    Types: Cigarettes    Quit date: 07/30/2020    Years since quitting: 1.7   Smokeless tobacco: Never   Tobacco comments:    reduce the # of cigarettes   Vaping Use   Vaping Use: Never used  Substance and Sexual Activity   Alcohol use: No   Drug use: No   Sexual activity: Not Currently    Partners: Male    Birth control/protection: Surgical    Comment: hyst

## 2022-06-24 DIAGNOSIS — L920 Granuloma annulare: Secondary | ICD-10-CM | POA: Insufficient documentation

## 2022-07-17 IMAGING — CT CT L SPINE W/ CM
1 of 9 series · 4 of 14 positions shown, 5 images · IV contrast (omnipaque)
Comparison: MR 01/12/2020 and previous

CLINICAL DATA: Low back and bilateral hip pain.

EXAM:
LUMBAR MYELOGRAM
CT LUMBAR SPINE WITH INTRATHECAL CONTRAST
FLUOROSCOPY TIME:  83 seconds; 602 uPym8 DAP
TECHNIQUE: The procedure, risks (including but not limited to bleeding,
infection, organ damage ), benefits, and alternatives were explained
to the patient. Questions regarding the procedure were encouraged
and answered. The patient understands and consents to the procedure.
An appropriate entry site was determined under fluoroscopy. Operator
donned sterile gloves and mask. Skin site was marked, prepped with
Betadine, and draped in usual sterile fashion, and infiltrated
locally with 1% lidocaine. A 22 gauge spinal needle was advanced
into the thecal sac at L2-3 from a right parasagittal approach.
Clear colorless CSF returned. 17 ml Omnipaque 180 were administered
intrathecally for lumbar myelography, followed by axial CT scanning
of the lumbar spine.
Under my personal supervision, Jesus Antonio Naqvi PA performed the lumbar
puncture and administered the intrathecal contrast. I also
personally supervised acquisition of the myelogram images. Coronal
and sagittal reconstructions were generated from the axial scan.

[Series 3: l spine soft · axial · 0.35mm/px · z∈[-447,-303]mm · 4 of 121 slices shown, 5 images]
[im 25/121  soft-tissue]
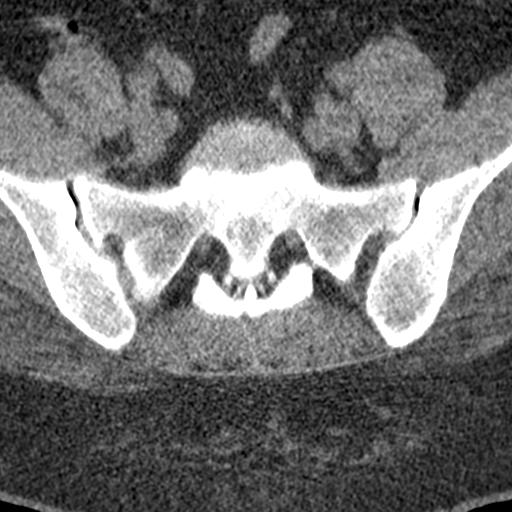
[im 25/121  bone]
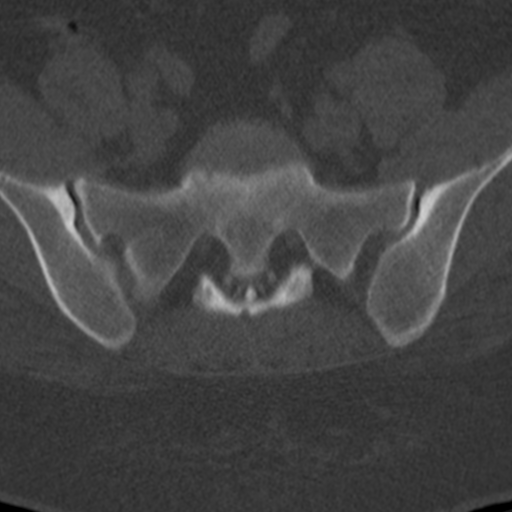
[im 49/121  bone]
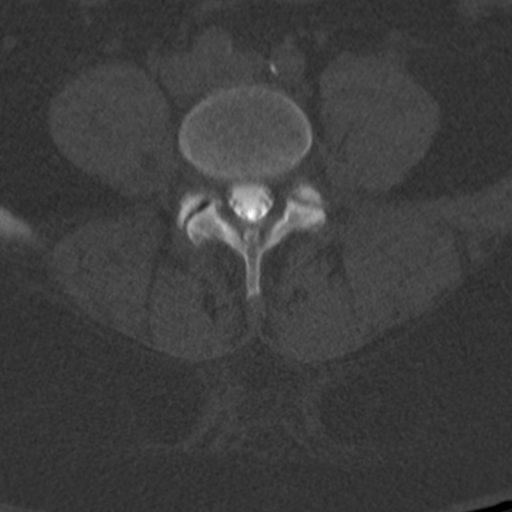
[im 73/121  bone]
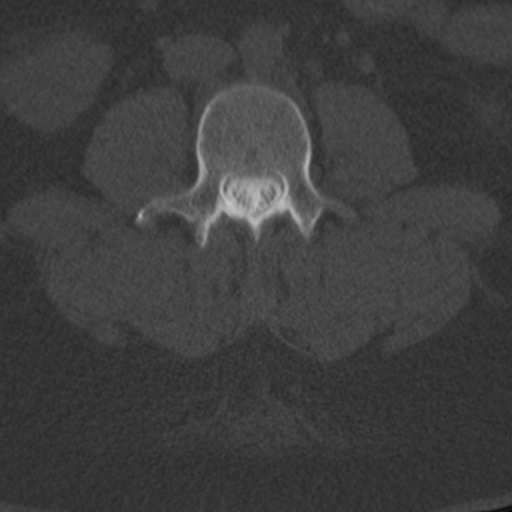
[im 97/121  bone]
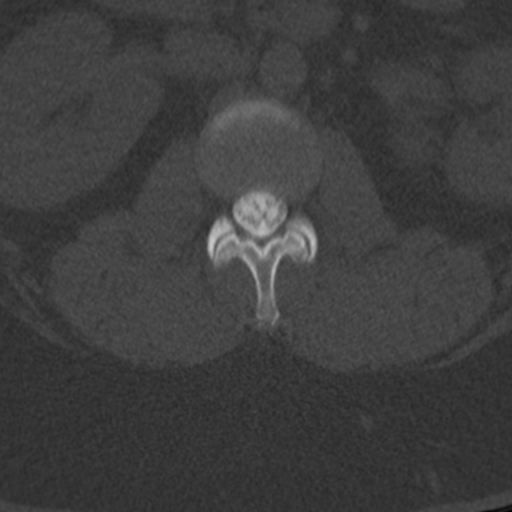

[4 of 14 positions shown; findings below may reference images not displayed]

FINDINGS: 5 non rib-bearing lumbar segments assigned L1-L5 as before. Normal
alignment. Negative for fracture. No dynamic instability on standing
lateral flexion/extension radiographs.

T12-L1: Interspace unremarkable. Central canal and foramina patent.

L1-2: Interspace unremarkable. Central canal and foramina patent.
Conus terminates at the level of the interspace.

L2-3:  Interspace unremarkable.  Central canal and foramina patent.

L3-4: Mild disc bulge with early left foraminal encroachment.
Central canal patent.

L4-5: Mild broad central protrusion, approaches the left L5 nerve
root in the lateral recess. No spinal stenosis. Foramina patent.
Mild facet DJD right greater than left.

L5-S1: Broad posterior disc bulge. No spinal stenosis. Foramina
patent.

Mild scattered aortoiliac calcified plaque. Remainder visualized
paraspinal soft tissues unremarkable.
IMPRESSION: 1. Mild disc bulge L3-4 with early left foraminal encroachment.
2. Central protrusion L[DATE] affect the left L5 nerve root in the
lateral recess.
3. Facet DJD L4-5, right greater than left.
4. Posterior disc bulge L5-S1 without compressive pathology.

## 2022-07-24 DIAGNOSIS — R09A2 Foreign body sensation, throat: Secondary | ICD-10-CM | POA: Insufficient documentation

## 2022-08-13 ENCOUNTER — Other Ambulatory Visit: Payer: Self-pay | Admitting: Cardiovascular Disease

## 2022-09-07 ENCOUNTER — Telehealth: Payer: Self-pay | Admitting: Radiology

## 2022-09-07 NOTE — Telephone Encounter (Signed)
VM from patient asking to get her medical records and xrays.  I called LMVM for her to either send [place] a signed release or we could fax ours to her and she could return via email.  Or she can stop by here to sign one.  Needs xrays too- those will need to come from [facility].

## 2022-10-19 ENCOUNTER — Other Ambulatory Visit: Payer: Self-pay | Admitting: Orthopedic Surgery

## 2022-10-19 DIAGNOSIS — S83204A Other tear of unspecified meniscus, current injury, left knee, initial encounter: Secondary | ICD-10-CM

## 2022-10-21 ENCOUNTER — Encounter: Payer: Self-pay | Admitting: Cardiovascular Disease

## 2022-10-21 ENCOUNTER — Ambulatory Visit: Payer: Medicaid Other | Attending: Cardiovascular Disease | Admitting: Cardiovascular Disease

## 2022-10-21 VITALS — BP 108/62 | HR 101 | Ht 63.0 in | Wt 307.6 lb

## 2022-10-21 DIAGNOSIS — Z720 Tobacco use: Secondary | ICD-10-CM

## 2022-10-21 DIAGNOSIS — G4733 Obstructive sleep apnea (adult) (pediatric): Secondary | ICD-10-CM

## 2022-10-21 DIAGNOSIS — E782 Mixed hyperlipidemia: Secondary | ICD-10-CM | POA: Diagnosis not present

## 2022-10-21 NOTE — Progress Notes (Signed)
10/21/2022 Robin Bender   1976-03-31  [id number]  Primary Physician Rosalee Kaufman, PA-C Primary Cardiologist: Lorretta Harp MD Lupe Carney, [place]  HPI:  Robin Bender is a 46 y.o.  severely overweight (BMI 22) divorced Caucasian female mother of 2 children and 4 stepchildren, grandmother of 1 grandchild referred by Gershon Crane, PA-C for cardiovascular valuation because of risk factors and family history. 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 Factors include currently smoking 1 pack of cigarettes per week down from a pack every other day. She has been smoking since age 3. She has untreated hyperlipidemia as well as family history of heart disease the father had a myocardial infarction and stents at the age of 56. She is never had a heart attack or stroke. She denies chest pain or shortness of breath.    Since I saw her 2-1/2 years ago I did get a coronary calcium score on her 04/04/2020 which was 0.  She has gained 20 pounds since I saw her last.  She denies chest pain or shortness of breath.  She does obstructive sleep apnea on CPAP which she does not tolerate.  She has been complaining of some palpitations over the last week or 2 but does admit to being under a lot of stress recently.   Current Meds  Medication Sig   albuterol (VENTOLIN HFA) 108 (90 Base) MCG/ACT inhaler Inhale 2 puffs into the lungs every 6 (six) hours as needed for wheezing or shortness of breath.   atorvastatin (LIPITOR) 20 MG tablet Take 1 tablet (20 mg total) by mouth daily. Please call and schedule an appointment with your cardiologist 1st attempt   cholecalciferol (VITAMIN D3) 25 MCG (1000 UNIT) tablet Take 1,000 Units by mouth daily.   Cyanocobalamin ([employer] B12 GUMMIES) 500 MCG CHEW Chew by mouth.   HYDROcodone-acetaminophen (NORCO/VICODIN) 5-325 MG tablet Take 1 tablet by mouth every 6 (six) hours as needed. (Patient  taking differently: Take 1-2 tablets by mouth every 6 (six) hours as needed for moderate pain.)   linaclotide (LINZESS) 72 MCG capsule Take 72 mcg by mouth daily as needed (constipation).   liraglutide (VICTOZA) 18 MG/3ML SOPN Inject into the skin daily.   metFORMIN (GLUCOPHAGE-XR) 500 MG 24 hr tablet Take 500 mg by mouth at bedtime.   Omega-3 Fatty Acids (FISH OIL PO) Take 2 capsules by mouth at bedtime.     Allergies  Allergen Reactions   Codeine Other (See Comments)    Blisters around mouth   Demerol Hives   Ampicillin-Sulbactam Sodium Rash    Rash, redness noted around iv site,     Social History   Socioeconomic History   Marital status: Soil scientist    Spouse name: Not on file   Number of children: 2   Years of education: Not on file   Highest education level: Not on file  Occupational History   Not on file  Tobacco Use   Smoking status: Former    Packs/day: 0.25    Years: 22.00    Total pack years: 5.50    Types: Cigarettes    Quit date: 07/30/2020    Years since quitting: 2.2   Smokeless tobacco: Never   Tobacco comments:    reduce the # of cigarettes   Vaping Use   Vaping Use: Never used  Substance and Sexual Activity   Alcohol use: No  Drug use: No   Sexual activity: Not Currently    Partners: Male    Birth control/protection: Surgical    Comment: hyst  Other Topics Concern   Not on file  Social History Narrative   Not on file   Social Determinants of Health   Financial Resource Strain: Not on file  Food Insecurity: Not on file  Transportation Needs: Not on file  Physical Activity: Not on file  Stress: Not on file  Social Connections: Not on file  Intimate Partner Violence: Not on file     Review of Systems: General: negative for chills, fever, night sweats or weight changes.  Cardiovascular: negative for chest pain, dyspnea on exertion, edema, orthopnea, palpitations, paroxysmal nocturnal dyspnea or shortness of breath Dermatological:  negative for rash Respiratory: negative for cough or wheezing Urologic: negative for hematuria Abdominal: negative for nausea, vomiting, diarrhea, bright red blood per rectum, melena, or hematemesis Neurologic: negative for visual changes, syncope, or dizziness All other systems reviewed and are otherwise negative except as noted above.    Blood pressure 108/62, pulse (!) 101, height 5\' 3"  (1.6 m), weight (!) 307 lb 9.6 oz (139.5 kg), SpO2 96 %.  General appearance: alert and no distress Neck: no adenopathy, no carotid bruit, no JVD, supple, symmetrical, trachea midline, and thyroid not enlarged, symmetric, no tenderness/mass/nodules Lungs: clear to auscultation bilaterally Heart: regular rate and rhythm, S1, S2 normal, no murmur, click, rub or gallop Extremities: extremities normal, atraumatic, no cyanosis or edema Pulses: 2+ and symmetric Skin: Skin color, texture, turgor normal. No rashes or lesions Neurologic: Grossly normal  EKG sinus tachycardia 101 without ST or T wave changes.  I personally reviewed this EKG.  ASSESSMENT AND PLAN:   Obstructive sleep apnea syndrome History of obstructive sleep apnea on CPAP which she does not tolerate.  I am referring her to Dr. , sleep cardiologist, for discussion regarding inspire.  Obesity, morbid, BMI 40.0-49.9 (HCC) BMI of 55.  I am referring her to the [facility] for physician assisted weight loss.  Hyperlipidemia History of hyperlipidemia on statin therapy with lipid profile performed 04/14/2021 revealing total cholesterol 161, LDL 83 and HDL of 50.  This is acceptable for primary prevention.  Tobacco abuse Patient stopped smoking 07/23/2020 at the time of COVID-pneumonia.     07/25/2020 MD FACP,FACC,FAHA, [facility] 10/21/2022 10:49 AM

## 2022-10-21 NOTE — Assessment & Plan Note (Signed)
BMI of 55.  I am referring her to the [facility] for physician assisted weight loss.

## 2022-10-21 NOTE — Patient Instructions (Signed)
Medication Instructions:  The current medical regimen is effective;  continue present plan and medications.  *If you need a refill on your cardiac medications before your next appointment, please call your pharmacy*   Follow-Up: At [facility], you and your health needs are our priority.  As part of our continuing mission to provide you with exceptional heart care, we have created designated Provider Care Teams.  These Care Teams include your primary Cardiologist (physician) and Advanced Practice Providers (APPs -  Physician Assistants and Nurse Practitioners) who all work together to provide you with the care you need, when you need it.  We recommend signing up for the patient portal called "MyChart".  Sign up information is provided on this After Visit Summary.  MyChart is used to connect with patients for Virtual Visits (Telemedicine).  Patients are able to view lab/test results, encounter notes, upcoming appointments, etc.  Non-urgent messages can be sent to your provider as well.   To learn more about what you can do with MyChart, go to [email or web address].    Your next appointment:   3 month(s)  The format for your next appointment:   In Person  Provider:   Any APP  Then, Nanetta Batty, MD will plan to see you again in 6 month(s).    Referral to Weight Management Referral to Armanda Magic, MD to discuss sleep apnea

## 2022-10-21 NOTE — Assessment & Plan Note (Signed)
Patient stopped smoking 07/23/2020 at the time of COVID-pneumonia.

## 2022-10-21 NOTE — Assessment & Plan Note (Signed)
History of hyperlipidemia on statin therapy with lipid profile performed 04/14/2021 revealing total cholesterol 161, LDL 83 and HDL of 50.  This is acceptable for primary prevention.

## 2022-10-21 NOTE — Assessment & Plan Note (Signed)
History of obstructive sleep apnea on CPAP which she does not tolerate.  I am referring her to Dr. Armanda Magic, sleep cardiologist, for discussion regarding inspire.

## 2022-11-06 ENCOUNTER — Inpatient Hospital Stay: Admission: RE | Admit: 2022-11-06 | Payer: Medicaid Other | Source: Ambulatory Visit

## 2022-12-22 ENCOUNTER — Other Ambulatory Visit: Payer: Self-pay | Admitting: Cardiovascular Disease

## 2022-12-24 ENCOUNTER — Ambulatory Visit: Payer: Medicaid Other | Attending: Cardiology | Admitting: Cardiology

## 2022-12-24 ENCOUNTER — Encounter: Payer: Self-pay | Admitting: Cardiology

## 2022-12-24 VITALS — BP 124/76 | HR 94 | Ht 63.0 in | Wt 315.8 lb

## 2022-12-24 DIAGNOSIS — G4733 Obstructive sleep apnea (adult) (pediatric): Secondary | ICD-10-CM | POA: Diagnosis not present

## 2022-12-24 NOTE — Patient Instructions (Signed)
Medication Instructions:  Your physician recommends that you continue on your current medications as directed. Please refer to the Current Medication list given to you today.  *If you need a refill on your cardiac medications before your next appointment, please call your pharmacy*   Lab Work: None.  If you have labs (blood work) drawn today and your tests are completely normal, you will receive your results only by: [place] (if you have MyChart) OR A paper copy in the mail If you have any lab test that is abnormal or we need to change your treatment, we will call you to review the results.   Testing/Procedures: A member of our sleep team will contact you regarding scheduling an in-lab bipap titration.  Follow-Up:    Your next appointment will be as needed depending on the results of your sleep study and it will be with:     Provider:   Dr. Fransico Him, MD

## 2022-12-24 NOTE — Progress Notes (Signed)
Sleep Medicine CONSULT Note    Date:  12/24/2022   ID:  Robin Bender, DOB 19-May-1976, MRN [id number]  PCP:  Rosalee Kaufman, PA-C  Cardiologist: Quay Burow, MD   Chief Complaint  Patient presents with   New Patient (Initial Visit)    OSA    History of Present Illness:  Robin Bender is a 47 y.o. female who is being seen today for the evaluation of obstructive sleep apnea at the request of Quay Burow, MD.  This is a 47 year old morbidly obese female with a history of anxiety, depression, bipolar disorder, fibromyalgia and hyperlipidemia.  She also has a history of obstructive sleep apnea and has been on CPAP has been intolerant to it.  Dr. Gwenlyn Found has now referred her to sleep medicine for consultation to discuss treatment options.  She had a split-night sleep study on 10/16/2020 which revealed severe obstructive sleep apnea with an AHI 52.7/h and nocturnal hypoxemia.  She underwent CPAP titration and was started on auto CPAP from 5 to 15 cm H2O.  She tells me that prior to starting the CPAP she felt tired during the day and would fall asleep every time she sat down.  She had non restorative sleep.  Her fiance told her that she snores badly and she stops breathing in her sleep.  She has had severe am HAs as well.  SHe has tried the CPAP but says that the pressure is too high.  She continues to have the above sx because she cannot use her CPAP.    She currently is on auto CPAP and uses a FFM. She says that the [facility] is uncomfortable and moves around a lot.  She tried a nasal mask but it hurt her nose.  She also thinks the pressure is too high.    Past Medical History:  Diagnosis Date   Anemia    Anxiety    Arthritis    Bipolar disorder ([place])    Depression    Fatigue    Fibromyalgia    Headache    Pneumonia due to COVID-19 virus 07/30/2020   [facility]   PTSD (post-traumatic stress disorder)    Sleep apnea     Past Surgical History:  Procedure Laterality Date    ABDOMINAL HYSTERECTOMY     APPENDECTOMY     CARPAL TUNNEL RELEASE Bilateral    CESAREAN SECTION  2002, 2004   DE QUERVAIN'S RELEASE Left 07/03/2020   DORSAL COMPARTMENT RELEASE Right 09/01/2020   Procedure: RIGHT 1ST DORSAL COMPARTMENT RELEASE;  Surgeon: Marybelle Killings, MD;  Location: [facility];  Service: Orthopedics;  Laterality: Right;   LAPAROSCOPIC APPENDECTOMY N/A 05/19/2014   Procedure: APPENDECTOMY LAPAROSCOPIC;  Surgeon: Jamesetta So, MD;  Location: [facility];  Service: General;  Laterality: N/A;   ovarian cyst removed     UMBILICAL HERNIA REPAIR N/A 05/22/2021   Procedure: HERNIA REPAIR UMBILICAL ADULT W/MESH;  Surgeon: Virl Cagey, MD;  Location: [facility];  Service: General;  Laterality: N/A;   VULVECTOMY PARTIAL Left 02/04/2021   Procedure: VULVECTOMY PARTIAL;  Surgeon: Florian Buff, MD;  Location: [facility];  Service: Gynecology;  Laterality: Left;    Current Medications: Current Meds  Medication Sig   albuterol (VENTOLIN HFA) 108 (90 Base) MCG/ACT inhaler Inhale 2 puffs into the lungs every 6 (six) hours as needed for wheezing or shortness of breath.   atorvastatin (LIPITOR) 20 MG tablet Take 1 tablet (20 mg total) by mouth  daily.   cholecalciferol (VITAMIN D3) 25 MCG (1000 UNIT) tablet Take 1,000 Units by mouth daily.   Cyanocobalamin (CVS B12 GUMMIES) 500 MCG CHEW Chew by mouth.   estradiol (ESTRACE) 1 MG tablet Take 1 tablet by mouth daily.   HYDROcodone-acetaminophen (NORCO) 7.5-325 MG tablet Take 1 tablet by mouth every 6 (six) hours as needed for moderate pain.   linaclotide (LINZESS) 72 MCG capsule Take 72 mcg by mouth daily as needed (constipation).   metFORMIN (GLUCOPHAGE-XR) 500 MG 24 hr tablet Take 500 mg by mouth at bedtime.   montelukast (SINGULAIR) 10 MG tablet Take 10 mg by mouth daily.    Allergies:   Codeine, Demerol, and Ampicillin-sulbactam sodium   Social History   Socioeconomic History   Marital status: Soil scientist     Spouse name: Not on file   Number of children: 2   Years of education: Not on file   Highest education level: Not on file  Occupational History   Not on file  Tobacco Use   Smoking status: Former    Packs/day: 0.25    Years: 22.00    Total pack years: 5.50    Types: Cigarettes    Quit date: 07/30/2020    Years since quitting: 2.4   Smokeless tobacco: Never   Tobacco comments:    reduce the # of cigarettes   Vaping Use   Vaping Use: Never used  Substance and Sexual Activity   Alcohol use: No   Drug use: No   Sexual activity: Not Currently    Partners: Male    Birth control/protection: Surgical    Comment: hyst  Other Topics Concern   Not on file  Social History Narrative   Not on file   Social Determinants of Health   Financial Resource Strain: Not on file  Food Insecurity: Not on file  Transportation Needs: Not on file  Physical Activity: Not on file  Stress: Not on file  Social Connections: Not on file     Family History:  The patient's family history includes Arthritis/Rheumatoid in her mother; Cancer in her mother; Heart disease in her mother; Hypertension in her mother.   ROS:   Please see the history of present illness.    ROS All other systems reviewed and are negative.      No data to display             PHYSICAL EXAM:   VS:  BP 124/76   Pulse 94   Ht 5' 3"$  (1.6 m)   Wt (!) 315 lb 12.8 oz (143.2 kg)   SpO2 95%   BMI 55.94 kg/m    GEN: Well nourished, well developed, in no acute distress  HEENT: normal  Neck: no JVD, carotid bruits, or masses Cardiac: RRR; no murmurs, rubs, or gallops,no edema.  Intact distal pulses bilaterally.  Respiratory:  clear to auscultation bilaterally, normal work of breathing GI: soft, nontender, nondistended, + BS MS: no deformity or atrophy  Skin: warm and dry, no rash Neuro:  Alert and Oriented x 3, Strength and sensation are intact Psych: euthymic mood, full affect  Wt Readings from Last 3 Encounters:   12/24/22 (!) 315 lb 12.8 oz (143.2 kg)  10/21/22 (!) 307 lb 9.6 oz (139.5 kg)  05/06/22 (!) 306 lb (138.8 kg)      Studies/Labs Reviewed:   Split-night sleep study  Recent Labs: No results found for requested labs within last 365 days.     Additional studies/ records that were  reviewed today include:  none    ASSESSMENT:    1. Obstructive sleep apnea syndrome      PLAN:  In order of problems listed above:  OSA  -split-night sleep study on 10/16/2020 which revealed severe obstructive sleep apnea with an AHI 52.7/h and nocturnal hypoxemia.  She underwent CPAP titration and was started on auto CPAP from 5 to 15 cm H2O. -Unfortunately she has been very intolerant to CPAP therapy. -in looking back at her split night sleep study, she could not be adequately titrated on CPAP and likely should have had a repeat in lab full night PAP titration.  -she does not tolerate the high pressures that the auto PAP gives and I think that the high pressure is uncomfortable to exhale against -I will refer her to the lab for a BiPAP titration since she does not tolerate auto CPAP -she is not a candidate for the Inspire device due to her BMI of 55 and would likely need to lose at least 100lbs.  2.  Morbid Obesity -her BMET is 55.9/hr -she is seeing a weight loss center  Time Spent: 20 minutes total time of encounter, including 15 minutes spent in face-to-face patient care on the date of this encounter. This time includes coordination of care and counseling regarding above mentioned problem list. Remainder of non-face-to-face time involved reviewing chart documents/testing relevant to the patient encounter and documentation in the medical record. I have independently reviewed documentation from referring provider  Medication Adjustments/Labs and Tests Ordered: Current medicines are reviewed at length with the patient today.  Concerns regarding medicines are outlined above.  Medication changes,  Labs and Tests ordered today are listed in the Patient Instructions below.  There are no Patient Instructions on file for this visit.   Signed, Fransico Him, MD  12/24/2022 10:30 AM    [facility] [place], [place], [place]  [address] Phone: [phone]; Fax: [phone]

## 2022-12-29 ENCOUNTER — Ambulatory Visit (INDEPENDENT_AMBULATORY_CARE_PROVIDER_SITE_OTHER): Payer: Medicaid Other | Admitting: Family Medicine

## 2022-12-29 ENCOUNTER — Encounter (INDEPENDENT_AMBULATORY_CARE_PROVIDER_SITE_OTHER): Payer: Self-pay | Admitting: Family Medicine

## 2022-12-29 ENCOUNTER — Telehealth: Payer: Self-pay | Admitting: *Deleted

## 2022-12-29 VITALS — BP 128/84 | HR 105 | Temp 98.1°F | Ht 63.0 in | Wt 315.0 lb

## 2022-12-29 DIAGNOSIS — Z6841 Body Mass Index (BMI) 40.0 and over, adult: Secondary | ICD-10-CM

## 2022-12-29 DIAGNOSIS — G4733 Obstructive sleep apnea (adult) (pediatric): Secondary | ICD-10-CM

## 2022-12-29 DIAGNOSIS — E119 Type 2 diabetes mellitus without complications: Secondary | ICD-10-CM | POA: Diagnosis not present

## 2022-12-29 DIAGNOSIS — E782 Mixed hyperlipidemia: Secondary | ICD-10-CM | POA: Diagnosis not present

## 2022-12-29 DIAGNOSIS — Z7984 Long term (current) use of oral hypoglycemic drugs: Secondary | ICD-10-CM

## 2022-12-29 NOTE — Progress Notes (Signed)
Office: [phone]  /  Fax: [phone]   Initial Visit  Robin Bender was seen in clinic today to evaluate for obesity. Robin Bender is interested in losing weight to improve overall health and reduce the risk of weight related complications. Robin Bender presents today to review program treatment options, initial physical assessment, and evaluation.     Robin Bender was referred by: Specialist  When asked what else they would like to accomplish? Robin Bender states: Improve existing medical conditions, Reduce number of medications, and Improve appearance Weight history:  started to gain weight at age 47- became less active Gained weight following [facility] in 2009- fibroid and endometriosis Would like to get to 200 lb  When asked how has your weight affected you? Robin Bender states: Contributed to medical problems, Contributed to orthopedic problems or mobility issues, and Having poor endurance  Some associated conditions: Hyperlipidemia, OSA, and Diabetes  Contributing factors: Disruption of circadian rhythm, Nutritional, Stress, and Reduced physical activity  Weight promoting medications identified: None  Current nutrition plan: None  Current level of physical activity: Walking  Current or previous pharmacotherapy: None  Response to medication: Never tried medications   Past medical history includes:   Past Medical History:  Diagnosis Date   Anemia    Anxiety    Arthritis    Bipolar disorder ([place])    Depression    Fatigue    Fibromyalgia    Headache    Pneumonia due to COVID-19 virus 07/30/2020   [facility]   PTSD (post-traumatic stress disorder)    Sleep apnea      Objective:   BP 128/84   Pulse (!) 105   Temp 98.1 F (36.7 C)   Ht 5' 3"$  (1.6 m)   Wt (!) 315 lb (142.9 kg)   SpO2 93%   BMI 55.80 kg/m  Robin Bender was weighed on the bioimpedance scale: Body mass index is 55.8 kg/m.  Peak Weight:315 lb , Body Fat%:56.7, Visceral Fat Rating:22, Weight trend over the last 12 months:  Increasing  General:  Alert, oriented and cooperative. Patient is in no acute distress.  Respiratory: Normal respiratory effort, no problems with respiration noted  Extremities: Normal range of motion.    Mental Status: Normal mood and affect. Normal behavior. Normal judgment and thought content.   DIAGNOSTIC DATA REVIEWED:  BMET    Component Value Date/Time   NA 137 05/20/2021 1457   K 3.4 (L) 05/20/2021 1457   CL 104 05/20/2021 1457   CO2 24 05/20/2021 1457   GLUCOSE 160 (H) 05/20/2021 1457   BUN 13 05/20/2021 1457   CREATININE 0.61 05/20/2021 1457   CALCIUM 8.5 (L) 05/20/2021 1457   GFRNONAA >60 05/20/2021 1457   GFRAA >60 01/16/2017 1251   Lab Results  Component Value Date   HGBA1C 5.8 (H) 05/20/2021   HGBA1C 6.4 (H) 02/02/2021   No results found for: "INSULIN" CBC    Component Value Date/Time   WBC 11.7 (H) 05/20/2021 1457   RBC 4.72 05/20/2021 1457   HGB 13.8 05/20/2021 1457   HCT 43.5 05/20/2021 1457   PLT 273 05/20/2021 1457   MCV 92.2 05/20/2021 1457   MCH 29.2 05/20/2021 1457   MCHC 31.7 05/20/2021 1457   RDW 13.2 05/20/2021 1457   RDW 14.3 08/20/2014 1355   Iron/TIBC/Ferritin/ %Sat    Component Value Date/Time   IRON 67 08/20/2014 1355   TIBC 341 08/20/2014 1355   FERRITIN 83 08/20/2014 1355   IRONPCTSAT 20 08/20/2014 1355   Lipid Panel  Component Value Date/Time   CHOL 161 04/14/2021 0858   TRIG 166 (H) 04/14/2021 0858   HDL 50 04/14/2021 0858   CHOLHDL 3.2 04/14/2021 0858   LDLCALC 83 04/14/2021 0858   Hepatic Function Panel     Component Value Date/Time   PROT 7.2 02/02/2021 1122   PROT 6.3 03/28/2020 1258   ALBUMIN 3.7 02/02/2021 1122   ALBUMIN 4.2 03/28/2020 1258   AST 37 02/02/2021 1122   ALT 31 02/02/2021 1122   ALKPHOS 68 02/02/2021 1122   BILITOT 0.5 02/02/2021 1122   BILITOT 0.5 03/28/2020 1258   BILIDIR 0.12 03/28/2020 1258      Component Value Date/Time   TSH 1.327 01/16/2017 1251     Assessment and Plan:    Type 2 diabetes mellitus without complication, without long-term current use of insulin (HCC) Assessment & Plan: Taking metformin 500 mg XR once daily Sees PCP routinely for diabetes management Not checking sugars at home Currently consuming a high amount of added sugars and refined carbohydrates with a lack of regular exercise  Consider use of a GLP-1 receptor agonist Begin reducing intake of SSBs and thinking about ways to increase walking time.   Morbid obesity (HCC)  BMI 50.0-59.9, adult (HCC)  OSA on CPAP Assessment & Plan: Pt is using CPAP at night for OSA Tolerating mask OK  Begin active plan for weight reduction Aim for 8 hrs of sleep at night with CPAP   Mixed hyperlipidemia Assessment & Plan: Lab Results  Component Value Date   CHOL 161 04/14/2021   HDL 50 04/14/2021   LDLCALC 83 04/14/2021   TRIG 166 (H) 04/14/2021   CHOLHDL 3.2 04/14/2021    Taking Atorvastatin 20 mg daily and managed by Dr Gwenlyn Found Denies myalgias  Begin active plan for weight reduction Her prescribed meal plan will be a low saturated fat/ low trans fat diet.         Obesity Treatment / Action Plan:  Patient will work on garnering support from family and friends to begin weight loss journey. Will work on eliminating or reducing the presence of highly palatable, calorie dense foods in the home. Will complete provided nutritional and psychosocial assessment questionnaire before the next appointment. Will be scheduled for indirect calorimetry to determine resting energy expenditure in a fasting state.  This will allow [place] to create a reduced calorie, high-protein meal plan to promote loss of fat mass while preserving muscle mass. Will think about ideas on how to incorporate physical activity into their daily routine. Will reduce liquid calories and sugary drinks from diet. Was counseled on nutritional approaches to weight loss and benefits of complex carbs and high quality protein as  part of nutritional weight management. Was counseled on pharmacotherapy and role as an adjunct in weight management.   Obesity Education Performed Today:  Robin Bender was weighed on the bioimpedance scale and results were discussed and documented in the synopsis.  We discussed obesity as a disease and the importance of a more detailed evaluation of all the factors contributing to the disease.  We discussed the importance of long term lifestyle changes which include nutrition, exercise and behavioral modifications as well as the importance of customizing this to her specific health and social needs.  We discussed the benefits of reaching a healthier weight to alleviate the symptoms of existing conditions and reduce the risks of the biomechanical, metabolic and psychological effects of obesity.  Margarette Asal appears to be in the action stage of change and states  they are ready to start intensive lifestyle modifications and behavioral modifications.  30 minutes was spent today on this visit including the above counseling, pre-visit chart review, and post-visit documentation.  Reviewed by clinician on day of visit: allergies, medications, problem list, medical history, surgical history, family history, social history, and previous encounter notes.  Loyal Gambler DO

## 2022-12-29 NOTE — Assessment & Plan Note (Signed)
Lab Results  Component Value Date   CHOL 161 04/14/2021   HDL 50 04/14/2021   LDLCALC 83 04/14/2021   TRIG 166 (H) 04/14/2021   CHOLHDL 3.2 04/14/2021    Taking Atorvastatin 20 mg daily and managed by Dr Gwenlyn Found Denies myalgias  Begin active plan for weight reduction Her prescribed meal plan will be a low saturated fat/ low trans fat diet.

## 2022-12-29 NOTE — Assessment & Plan Note (Signed)
Pt is using CPAP at night for OSA Tolerating mask OK  Begin active plan for weight reduction Aim for 8 hrs of sleep at night with CPAP

## 2022-12-29 NOTE — Telephone Encounter (Signed)
-----   Message from Joni Reining, RN sent at 12/24/2022 10:42 AM EST ----- Regarding: inlab bipap titration Dr. Radford Pax would like an in-lab bipap titration scheduled for this patient and she wants it done with patient using an under-the-nose full face  mask.  Thank you and happy Friday, Danae Chen, [place]

## 2022-12-29 NOTE — Assessment & Plan Note (Signed)
Taking metformin 500 mg XR once daily Sees PCP routinely for diabetes management Not checking sugars at home Currently consuming a high amount of added sugars and refined carbohydrates with a lack of regular exercise  Consider use of a GLP-1 receptor agonist Begin reducing intake of SSBs and thinking about ways to increase walking time.

## 2023-01-04 ENCOUNTER — Other Ambulatory Visit: Payer: Self-pay | Admitting: Sports Medicine

## 2023-01-04 DIAGNOSIS — S56511A Strain of other extensor muscle, fascia and tendon at forearm level, right arm, initial encounter: Secondary | ICD-10-CM

## 2023-01-06 ENCOUNTER — Encounter: Payer: Self-pay | Admitting: Radiology

## 2023-01-12 ENCOUNTER — Encounter: Payer: Self-pay | Admitting: *Deleted

## 2023-01-17 NOTE — Telephone Encounter (Signed)
Returned patient call and call was completed.

## 2023-01-17 NOTE — Telephone Encounter (Signed)
   Pt is calling to f/u when she will be scheduled for sleep study

## 2023-01-18 ENCOUNTER — Ambulatory Visit: Payer: Medicaid Other | Admitting: General Practice

## 2023-01-19 NOTE — Progress Notes (Unsigned)
Cardiology Clinic Note   Date: 01/20/2023 ID: KAYLEENA HEARNE, DOB 1975-11-23, MRN [id number]  Primary Cardiologist:  Quay Burow, MD  Patient Profile    Robin Bender is a 47 y.o. female who presents to the clinic today for 70-monthfollow-up.  Past medical history significant for: Hyperlipidemia. Lipid panel: 04/14/2021: LDL 83, HDL 50, TG 166, total 161. Palpitations. OSA. T2DM. Obesity. History of tobacco use. Total cessation September 2021.   History of Present Illness    Robin DURYwas first evaluated by Dr. BGwenlyn Foundon 03/26/2020 for risk stratification secondary to risk factors and family history of CAD at the request of KGershon Crane PA-C.  She underwent CT cardiac scoring which showed a coronary calcium score of 0 with no extracardiac findings.  She was last seen in the office by Dr. BGwenlyn Foundon 10/21/2022.  At that visit she reported she quit smoking in September 2021 secondary to COVID infection.  She was referred to Dr. TRadford Paxto discuss inspire for OSA.  [facility] assisted [facility].  Patient had a visit with Dr. TRadford Paxon 12/24/2022.  She was referred to the lab for BiPAP titration secondary to not tolerating CPAP.  Dr. TRadford Paxfelt she was not a candidate for inspire due to BMI.  Today, patient reports she is doing well.  She has not been able to do her BiPAP titration secondary to waiting on insurance to approve it. Patient denies shortness of breath or dyspnea on exertion. No chest pain, pressure, or tightness. Denies lower extremity edema, orthopnea, or PND.  She reports occasional palpitations but nothing sustained.  She is working on weight loss through portion reduction.  She performs all household duties including heavy cleaning activities.  She does not do a dedicated exercise program.  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  She has no concerns today.   ROS: All other  systems reviewed and are otherwise negative except as noted in History of Present Illness.  Studies Reviewed    ECG is not ordered today.  Physical Exam    VS:  BP 136/86 (BP Location: Left Arm, Patient Position: Sitting, Cuff Size: Normal)   Pulse 95   Ht '5\' 3"'$  (1.6 m)   Wt (!) 311 lb 12.8 oz (141.4 kg)   SpO2 93%   BMI 55.23 kg/m  , BMI Body mass index is 55.23 kg/m.  GEN: Well nourished, well developed, in no acute distress. Neck: No JVD or carotid bruits. Cardiac: RRR. No murmurs. No rubs or gallops.   Respiratory:  Respirations regular and unlabored. Clear to auscultation without rales, wheezing or rhonchi. GI: Soft, nontender, nondistended. Extremities: Radials/DP/PT 2+ and equal bilaterally. No clubbing or cyanosis. No edema.  Skin: Warm and dry, no rash. Neuro: Strength intact.  Assessment & Plan   Hyperlipidemia.  LDL June 2022 83, at goal.  She has not had her lipids checked since 2022.  She is not fasting this morning so she will return when she is in a fasting state to get a lipid panel and CMP.  Continue atorvastatin. Palpitations.  Patient denies palpitations.  She has a regular rate and rhythm on auscultation today. OSA.  Patient unable to undergo BiPAP titration as she was waiting for insurance approval.  She is unable to tolerate her CPAP.  She reports poor sleep most nights.  She is encouraged to check on insurance approval.  Disposition: Lipid panel and CMP.  Return in 1 year or sooner as needed.       Signed, Justice Britain. Keysi Oelkers, DNP, NP-C

## 2023-01-20 ENCOUNTER — Encounter: Payer: Self-pay | Admitting: Student

## 2023-01-20 ENCOUNTER — Ambulatory Visit: Payer: Medicaid Other | Attending: General Practice | Admitting: Student

## 2023-01-20 VITALS — BP 136/86 | HR 95 | Ht 63.0 in | Wt 311.8 lb

## 2023-01-20 DIAGNOSIS — G4733 Obstructive sleep apnea (adult) (pediatric): Secondary | ICD-10-CM | POA: Diagnosis not present

## 2023-01-20 DIAGNOSIS — Z79899 Other long term (current) drug therapy: Secondary | ICD-10-CM | POA: Diagnosis not present

## 2023-01-20 DIAGNOSIS — E782 Mixed hyperlipidemia: Secondary | ICD-10-CM

## 2023-01-20 DIAGNOSIS — R002 Palpitations: Secondary | ICD-10-CM

## 2023-01-20 NOTE — Patient Instructions (Signed)
Medication Instructions:  Your physician recommends that you continue on your current medications as directed. Please refer to the Current Medication list given to you today.  *If you need a refill on your cardiac medications before your next appointment, please call your pharmacy*   Lab Work: Your physician recommends that you return as soon as possible to have the following labs drawn: Lipid Panel and CMP  If you have labs (blood work) drawn today and your tests are completely normal, you will receive your results only by: MyChart Message (if you have MyChart) OR A paper copy in the mail If you have any lab test that is abnormal or we need to change your treatment, we will call you to review the results.   Testing/Procedures: NONE    Follow-Up: At [facility], you and your health needs are our priority.  As part of our continuing mission to provide you with exceptional heart care, we have created designated Provider Care Teams.  These Care Teams include your primary Cardiologist (physician) and Advanced Practice Providers (APPs -  Physician Assistants and Nurse Practitioners) who all work together to provide you with the care you need, when you need it.  We recommend signing up for the patient portal called "MyChart".  Sign up information is provided on this After Visit Summary.  MyChart is used to connect with patients for Virtual Visits (Telemedicine).  Patients are able to view lab/test results, encounter notes, upcoming appointments, etc.  Non-urgent messages can be sent to your provider as well.   To learn more about what you can do with MyChart, go to [email or web address].    Your next appointment:   1 year(s)  Provider:   Quay Burow, MD

## 2023-01-23 ENCOUNTER — Ambulatory Visit
Admission: RE | Admit: 2023-01-23 | Discharge: 2023-01-23 | Disposition: A | Discharge status: Home / Self Care | Payer: Medicaid Other | Source: Ambulatory Visit | Attending: Sports Medicine | Admitting: Sports Medicine

## 2023-01-23 DIAGNOSIS — S56511A Strain of other extensor muscle, fascia and tendon at forearm level, right arm, initial encounter: Secondary | ICD-10-CM

## 2023-01-25 ENCOUNTER — Other Ambulatory Visit: Payer: Self-pay | Admitting: Cardiovascular Disease

## 2023-01-25 ENCOUNTER — Other Ambulatory Visit: Payer: Self-pay | Admitting: *Deleted

## 2023-01-25 DIAGNOSIS — Z79899 Other long term (current) drug therapy: Secondary | ICD-10-CM

## 2023-01-25 DIAGNOSIS — E782 Mixed hyperlipidemia: Secondary | ICD-10-CM

## 2023-01-26 LAB — COMPREHENSIVE METABOLIC PANEL
ALT: 14 IU/L (ref 0–32)
AST: 14 IU/L (ref 0–40)
Albumin/Globulin Ratio: 1.5 (ref 1.2–2.2)
Albumin: 4.1 g/dL (ref 3.9–4.9)
Alkaline Phosphatase: 102 IU/L (ref 44–121)
BUN/Creatinine Ratio: 21 (ref 9–23)
BUN: 11 mg/dL (ref 6–24)
Bilirubin Total: 0.5 mg/dL (ref 0.0–1.2)
CO2: 22 mmol/L (ref 20–29)
Calcium: 9.2 mg/dL (ref 8.7–10.2)
Chloride: 102 mmol/L (ref 96–106)
Creatinine, Ser: 0.53 mg/dL — ABNORMAL LOW (ref 0.57–1.00)
Globulin, Total: 2.7 g/dL (ref 1.5–4.5)
Glucose: 87 mg/dL (ref 70–99)
Potassium: 4.3 mmol/L (ref 3.5–5.2)
Sodium: 141 mmol/L (ref 134–144)
Total Protein: 6.8 g/dL (ref 6.0–8.5)
eGFR: 116 mL/min/{1.73_m2} (ref >59)

## 2023-01-26 LAB — LIPID PANEL
Chol/HDL Ratio: 2.8 ratio (ref 0.0–4.4)
Cholesterol, Total: 173 mg/dL (ref 100–199)
HDL: 62 mg/dL (ref >39)
LDL Chol Calc (NIH): 85 mg/dL (ref 0–99)
Triglycerides: 151 mg/dL — ABNORMAL HIGH (ref 0–149)
VLDL Cholesterol Cal: 26 mg/dL (ref 5–40)

## 2023-02-01 ENCOUNTER — Telehealth (INDEPENDENT_AMBULATORY_CARE_PROVIDER_SITE_OTHER): Payer: Self-pay | Admitting: *Deleted

## 2023-02-01 NOTE — Telephone Encounter (Signed)
  Procedure: Colonoscopy  Estimated body mass index is 55.23 kg/m as calculated from the following:   Height as of 01/20/23: 5\' 3"  (1.6 m).   Weight as of 01/20/23: 311 lb 12.8 oz (141.4 kg).  Have you had a colonoscopy before?  no  Do you have family history of colon cancer?  no  Do you have a family history of polyps? yes  Previous colonoscopy with polyps removed? no  Do you have a history colorectal cancer?   no  Are you diabetic?  Yes type 2  Do you have a prosthetic or mechanical heart valve? no  Do you have a pacemaker/defibrillator?   no  Have you had endocarditis/atrial fibrillation?  no  Do you use supplemental oxygen/CPAP?  yes, cpap  Have you had joint replacement within the last 12 months?  no  Do you tend to be constipated or have to use laxatives?  yes   Do you have history of alcohol use? If yes, how much and how often.  no  Do you have history or are you using drugs? If yes, what do are you  using?  no  Have you ever had a stroke/heart attack?  no  Have you ever had a heart or other vascular stent placed,?no  Do you take weight loss medication? no  female patients,: have you had a hysterectomy? yes                              are you post menopausal?  yes                              do you still have your menstrual cycle? no    Date of last menstrual period? 02/2008  Do you take any blood-thinning medications such as: (Plavix, aspirin, Coumadin, Aggrenox, Brilinta, Xarelto, Eliquis, Pradaxa, Savaysa or Effient)? no  If yes we need the name, milligram, dosage and who is prescribing doctor:               Current Outpatient Medications  Medication Sig Dispense Refill   albuterol (VENTOLIN HFA) 108 (90 Base) MCG/ACT inhaler Inhale 2 puffs into the lungs every 6 (six) hours as needed for wheezing or shortness of breath.     atorvastatin (LIPITOR) 20 MG tablet TAKE ONE (1) TABLET EACH DAY 60 tablet 10   cholecalciferol (VITAMIN D3) 25 MCG (1000 UNIT)  tablet Take 1,000 Units by mouth daily.     estradiol (ESTRACE) 1 MG tablet Take 1 tablet by mouth daily.     HYDROcodone-acetaminophen (NORCO) 7.5-325 MG tablet Take 1 tablet by mouth every 6 (six) hours as needed for moderate pain.     linaclotide (LINZESS) 72 MCG capsule Take 72 mcg by mouth daily as needed (constipation).     metFORMIN (GLUCOPHAGE-XR) 500 MG 24 hr tablet Take 500 mg by mouth at bedtime.     montelukast (SINGULAIR) 10 MG tablet Take 10 mg by mouth daily.     No current facility-administered medications for this visit.    Allergies  Allergen Reactions   Codeine Other (See Comments)    Blisters around mouth   Demerol Hives   Ampicillin-Sulbactam Sodium Rash    Rash, redness noted around iv site,

## 2023-02-01 NOTE — Telephone Encounter (Signed)
Due to BMI and constipation, will need OV prior to scheduling procedure.

## 2023-02-10 ENCOUNTER — Ambulatory Visit (INDEPENDENT_AMBULATORY_CARE_PROVIDER_SITE_OTHER): Payer: Medicaid Other | Admitting: Gastroenterology

## 2023-02-10 ENCOUNTER — Encounter (INDEPENDENT_AMBULATORY_CARE_PROVIDER_SITE_OTHER): Payer: Self-pay | Admitting: Internal Medicine

## 2023-02-10 ENCOUNTER — Encounter: Payer: Self-pay | Admitting: Gastroenterology

## 2023-02-10 ENCOUNTER — Telehealth (INDEPENDENT_AMBULATORY_CARE_PROVIDER_SITE_OTHER): Payer: Self-pay | Admitting: Internal Medicine

## 2023-02-10 VITALS — BP 118/81 | HR 100 | Temp 98.1°F | Ht 63.0 in | Wt 310.0 lb

## 2023-02-10 DIAGNOSIS — Z83719 Family history of colon polyps, unspecified: Secondary | ICD-10-CM

## 2023-02-10 DIAGNOSIS — K5900 Constipation, unspecified: Secondary | ICD-10-CM

## 2023-02-10 DIAGNOSIS — Z1211 Encounter for screening for malignant neoplasm of colon: Secondary | ICD-10-CM | POA: Diagnosis not present

## 2023-02-10 MED ORDER — PEG 3350-KCL-NA BICARB-NACL 420 G PO SOLR: 4000.0000 mL | Freq: Once | ORAL | 0 refills | Status: AC

## 2023-02-10 NOTE — Telephone Encounter (Signed)
Pt in office and needing TCS scheduled with Dr.Carver. Mistakenly placed pt on Dr.Castaneda. contacted pt to switch her to [place]. pt now scheduled for 03/18/23 instead of 03/11/23. Pt verbalized understanding. New instructions sent via mail

## 2023-02-10 NOTE — Progress Notes (Signed)
GI Office Note    Referring Provider: Rosalee Kaufman, * Primary Care Physician:  Rosine Door  Primary Gastroenterologist: Elon Alas. Abbey Chatters, DO  Chief Complaint   Chief Complaint  Patient presents with   Constipation    Takes linzess 72  3 -4 times per month. Does not take every day due to it causing diarrhea. Takes linzess if she goes more than 2 days without BM. Has also tried miralax in the past.    Colon Cancer Screening    Would like to schedule first colonoscopy. Mother and grandmother both had polpys.    History of Present Illness   Robin Bender is a 47 y.o. female presenting today at the request of Rosalee Kaufman, * for colonoscopy and evaluation of constipation.   Family history of colon polyps in her mother and grandmother. Mother diagnosed in her 68s.  Constipation - Has had issues with constipation since her hysterectomy. Had scar tissue on her colon that was removed. Currently using Linzess 72 mcg about 3-4 times per month.  Not able to take every day due to diarrhea.  Usually will take this if she goes more than 2 days without a bowel movement.  Has tried MiraLAX in the past without good relief. Its greens and fiber rich foods. Primarily drinks water most days. Sometimes drinks coffee. Hot chocolate used to make her go. Has not drank any sodas in about 2 years. Sometiesm she may go 2-3 days without a BM and then will take Linzess. Has about 3 watery bowel movments after taking the Linzess. If she does have a solid BM she does have to strain. Sometimes after having good bm she may have a little bit more of an appetite. Nom melena or brbpr. No unintentional weight loss.   No reflux, N/V. Only has SOB with exertion. No chest pain.   Does have some issues with breads at times but does not eat it often. Does not occur on a regular basis. She reports her sleep apnea doctor says she has a small airway. She is waiting on insurance to see if she  needs BiPap vs CPAP.   Never feels hungry. Has been like this for years. Does not eat breakfast. Will eat with people since they are eating. Does not have a large desire for foods.   Reports she had Covid pneumonia in September 2021. Quit smoking and quit sodas during that time as well. Has developed diabetes. Takes metformin only now. Last year took Victoza but got tired of the injections so she only takes metformin. No side effects. Blood sugars have bene stable.      Current Outpatient Medications  Medication Sig Dispense Refill   albuterol (VENTOLIN HFA) 108 (90 Base) MCG/ACT inhaler Inhale 2 puffs into the lungs every 6 (six) hours as needed for wheezing or shortness of breath.     atorvastatin (LIPITOR) 20 MG tablet TAKE ONE (1) TABLET EACH DAY 60 tablet 10   cholecalciferol (VITAMIN D3) 25 MCG (1000 UNIT) tablet Take 1,000 Units by mouth daily.     estradiol (ESTRACE) 1 MG tablet Take 1 tablet by mouth daily.     HYDROcodone-acetaminophen (NORCO) 7.5-325 MG tablet Take 1 tablet by mouth every 6 (six) hours as needed for moderate pain.     linaclotide (LINZESS) 72 MCG capsule Take 72 mcg by mouth daily as needed (constipation).     metFORMIN (GLUCOPHAGE-XR) 500 MG 24 hr tablet Take 500 mg by mouth at  bedtime.     montelukast (SINGULAIR) 10 MG tablet Take 10 mg by mouth daily.     OVER THE COUNTER MEDICATION Calcium one daily     No current facility-administered medications for this visit.    Past Medical History:  Diagnosis Date   Anemia    Anxiety    Arthritis    Bipolar disorder    Depression    Fatigue    Fibromyalgia    Headache    Pneumonia due to COVID-19 virus 07/30/2020   [facility]   PTSD (post-traumatic stress disorder)    Sleep apnea     Past Surgical History:  Procedure Laterality Date   ABDOMINAL HYSTERECTOMY     APPENDECTOMY     CARPAL TUNNEL RELEASE Bilateral    CESAREAN SECTION  2002, 2004   DE QUERVAIN'S RELEASE Left 07/03/2020   DORSAL  COMPARTMENT RELEASE Right 09/01/2020   Procedure: RIGHT 1ST DORSAL COMPARTMENT RELEASE;  Surgeon: Marybelle Killings, MD;  Location: [place];  Service: Orthopedics;  Laterality: Right;   LAPAROSCOPIC APPENDECTOMY N/A 05/19/2014   Procedure: APPENDECTOMY LAPAROSCOPIC;  Surgeon: Jamesetta So, MD;  Location: [facility];  Service: General;  Laterality: N/A;   ovarian cyst removed     UMBILICAL HERNIA REPAIR N/A 05/22/2021   Procedure: HERNIA REPAIR UMBILICAL ADULT W/MESH;  Surgeon: Virl Cagey, MD;  Location: [facility];  Service: General;  Laterality: N/A;   VULVECTOMY PARTIAL Left 02/04/2021   Procedure: VULVECTOMY PARTIAL;  Surgeon: Florian Buff, MD;  Location: [facility];  Service: Gynecology;  Laterality: Left;   WISDOM TOOTH EXTRACTION      Family History  Problem Relation Age of Onset   Cancer Mother        cervical   Hypertension Mother    Heart disease Mother    Arthritis/Rheumatoid Mother     Allergies as of 02/10/2023 - Review Complete 02/10/2023  Allergen Reaction Noted   Codeine Other (See Comments) 01/20/2012   Demerol Hives 01/20/2012   Ampicillin-sulbactam sodium Rash 05/19/2014    Social History   Socioeconomic History   Marital status: Soil scientist    Spouse name: Not on file   Number of children: 2   Years of education: Not on file   Highest education level: Not on file  Occupational History   Not on file  Tobacco Use   Smoking status: Former    Packs/day: 0.25    Years: 22.00    Additional pack years: 0.00    Total pack years: 5.50    Types: Cigarettes    Quit date: 07/30/2020    Years since quitting: 2.5    Passive exposure: Past   Smokeless tobacco: Never   Tobacco comments:    reduce the # of cigarettes   Vaping Use   Vaping Use: Never used  Substance and Sexual Activity   Alcohol use: No   Drug use: No   Sexual activity: Not Currently    Partners: Male    Birth control/protection: Surgical    Comment: hyst  Other Topics  Concern   Not on file  Social History Narrative   Not on file   Social Determinants of Health   Financial Resource Strain: Not on file  Food Insecurity: Not on file  Transportation Needs: Not on file  Physical Activity: Not on file  Stress: Not on file  Social Connections: Not on file  Intimate Partner Violence: Not on file     Review of Systems  Gen: + fatigue and lack of appetite. Denies any fever, chills, weight loss.  CV: Denies chest pain, heart palpitations, peripheral edema, syncope.  Resp: Denies shortness of breath at rest or with exertion. Denies wheezing or cough.  GI: see HPI GU : Denies urinary burning, urinary frequency, urinary hesitancy MS: Denies joint pain, muscle weakness, cramps, or limitation of movement.  Derm: Denies rash, itching, dry skin Psych: Denies depression, anxiety, memory loss, and confusion Heme: Denies bruising, bleeding, and enlarged lymph nodes.   Physical Exam   BP 118/81 (BP Location: Left Arm, Patient Position: Sitting, Cuff Size: Large)   Pulse 100   Temp 98.1 F (36.7 C) (Oral)   Ht 5\' 3"  (1.6 m)   Wt (!) 310 lb (140.6 kg)   BMI 54.91 kg/m   General:   Alert and oriented. Pleasant and cooperative. Well-nourished and well-developed.  Head:  Normocephalic and atraumatic. Eyes:  Without icterus, sclera clear and conjunctiva pink.  Ears:  Normal auditory acuity. Mouth:  No deformity or lesions, oral mucosa pink.  Lungs:  Clear to auscultation bilaterally. No wheezes, rales, or rhonchi. No distress.  Heart:  S1, S2 present without murmurs appreciated.  Abdomen:  +BS, rounded, soft, non-tender and non-distended. No HSM noted. No guarding or rebound. No masses appreciated.  Rectal:  Deferred  Msk:  Symmetrical without gross deformities. Normal posture. Extremities:  Without edema. Neurologic:  Alert and  oriented x4;  grossly normal neurologically. Skin:  Intact without significant lesions or rashes. Psych:  Alert and  cooperative. Normal mood and affect.   Assessment   Robin Bender is a 47 y.o. female with a history of constipation, anemia, anxiety, bipolar, depression, fibromyalgia, PTSD presenting today to schedule screening colonoscopy.  Constipation: History of constipation since her hysterectomy.  Has tried MiraLAX in the past without good relief.  Typically eats a lot of greens and fiber rich vegetables and still has constipation.  At times she may go 2-3 days bowel movement but typically has good relief with taking Linzess 72 mcg if she does not have a bowel movement in 2 days.  Usually she will have about 3 watery bowel movements after this.  This is why she does not take it on a daily basis.  Screening for colon cancer, family history of colon polyps: Never had a colonoscopy.  She does note a family history of colon polyps in her mother and her grandmother.  Believes her mother was diagnosed with colon polyps in her 59s.  No alarm symptoms present.  She has a baseline lack of appetite but no unintentional weight loss, nausea, vomiting, dysphagia, changes in bowel habits.  Will proceed with first-ever screening colonoscopy.   PLAN   Proceed with colonoscopy with propofol by Dr. Abbey Chatters in near future: the risks, benefits, and alternatives have been discussed with the patient in detail. The patient states understanding and desires to proceed. ASA 3 (BMI) Linzess 72 mcg daily for 2-3 days prior to procedure.  Hold metformin the night prior to procedure and morning of Continue Linzess 72 mcg as needed. Follow up as needed.     Venetia Night, MSN, FNP-BC, AGACNP-BC [facility]

## 2023-02-10 NOTE — Patient Instructions (Addendum)
We are scheduling for a colonoscopy in the near future with Dr. Abbey Chatters.  He will need to hold your metformin the night prior to and the morning of the procedure.  Please take Linzess 72 mcg daily for 2 to 3 days prior to your procedure.  It was a pleasure to see you today. I want to create trusting relationships with patients. If you receive a survey regarding your visit,  I greatly appreciate you taking time to fill this out on paper or through your MyChart. I value your feedback.  Venetia Night, MSN, FNP-BC, AGACNP-BC [facility]

## 2023-02-10 NOTE — Addendum Note (Signed)
Addended by: Vicente Males on: 02/10/2023 11:05 AM   Modules accepted: Orders

## 2023-02-10 NOTE — Telephone Encounter (Signed)
PA via [facility]  Thank you for your online prior authorization/notification submission.  The prior authorization/notification reference number is: [id number]. The prior authorization/notification case information was transmitted on 02/10/2023 at 10:37 AM CDT.  Office visit copied and pasted to Microsoft Word document and uploaded to [facility] website for purpose of PA.

## 2023-02-23 NOTE — Addendum Note (Signed)
Addended by: Reesa Chew on: 02/23/2023 10:15 AM   Modules accepted: Orders

## 2023-03-02 NOTE — Telephone Encounter (Signed)
Pt contacted and made aware of pre op appt.  

## 2023-03-15 ENCOUNTER — Encounter (HOSPITAL_COMMUNITY)
Admission: RE | Admit: 2023-03-15 | Discharge: 2023-03-15 | Disposition: A | Discharge status: Home / Self Care | Payer: Medicaid Other | Source: Ambulatory Visit | Attending: Internal Medicine | Admitting: Internal Medicine

## 2023-03-18 ENCOUNTER — Encounter (HOSPITAL_COMMUNITY)
Admission: RE | Disposition: A | Discharge status: Home / Self Care | Payer: Self-pay | Source: Home / Self Care | Attending: Internal Medicine

## 2023-03-18 ENCOUNTER — Ambulatory Visit (HOSPITAL_COMMUNITY)
Admission: RE | Admit: 2023-03-18 | Discharge: 2023-03-18 | Disposition: A | Discharge status: Home / Self Care | Payer: Medicaid Other | Attending: Internal Medicine | Admitting: Internal Medicine

## 2023-03-18 ENCOUNTER — Ambulatory Visit (HOSPITAL_COMMUNITY): Payer: Medicaid Other | Admitting: Certified Registered"

## 2023-03-18 ENCOUNTER — Encounter (HOSPITAL_COMMUNITY): Payer: Self-pay

## 2023-03-18 ENCOUNTER — Ambulatory Visit (HOSPITAL_BASED_OUTPATIENT_CLINIC_OR_DEPARTMENT_OTHER): Payer: Medicaid Other | Admitting: Certified Registered"

## 2023-03-18 DIAGNOSIS — K648 Other hemorrhoids: Secondary | ICD-10-CM

## 2023-03-18 DIAGNOSIS — F319 Bipolar disorder, unspecified: Secondary | ICD-10-CM | POA: Insufficient documentation

## 2023-03-18 DIAGNOSIS — Z7984 Long term (current) use of oral hypoglycemic drugs: Secondary | ICD-10-CM | POA: Diagnosis not present

## 2023-03-18 DIAGNOSIS — Z83719 Family history of colon polyps, unspecified: Secondary | ICD-10-CM | POA: Insufficient documentation

## 2023-03-18 DIAGNOSIS — E119 Type 2 diabetes mellitus without complications: Secondary | ICD-10-CM | POA: Insufficient documentation

## 2023-03-18 DIAGNOSIS — Z1211 Encounter for screening for malignant neoplasm of colon: Secondary | ICD-10-CM

## 2023-03-18 DIAGNOSIS — Z6841 Body Mass Index (BMI) 40.0 and over, adult: Secondary | ICD-10-CM

## 2023-03-18 DIAGNOSIS — G4730 Sleep apnea, unspecified: Secondary | ICD-10-CM | POA: Insufficient documentation

## 2023-03-18 DIAGNOSIS — Z87891 Personal history of nicotine dependence: Secondary | ICD-10-CM | POA: Diagnosis not present

## 2023-03-18 DIAGNOSIS — G709 Myoneural disorder, unspecified: Secondary | ICD-10-CM | POA: Insufficient documentation

## 2023-03-18 HISTORY — PX: COLONOSCOPY WITH PROPOFOL: SHX5780

## 2023-03-18 LAB — GLUCOSE, CAPILLARY: Glucose-Capillary: 97 mg/dL (ref 70–99)

## 2023-03-18 SURGERY — COLONOSCOPY WITH PROPOFOL
Anesthesia: General

## 2023-03-18 MED ORDER — LACTATED RINGERS IV SOLN: INTRAVENOUS | Status: DC | PRN

## 2023-03-18 MED ORDER — PROPOFOL 10 MG/ML IV BOLUS
INTRAVENOUS | Status: DC | PRN
  Administered 2023-03-18: 120 mg via INTRAVENOUS

## 2023-03-18 MED ORDER — LIDOCAINE HCL (CARDIAC) PF 100 MG/5ML IV SOSY
PREFILLED_SYRINGE | INTRAVENOUS | Status: DC | PRN
  Administered 2023-03-18: 50 mg via INTRATRACHEAL

## 2023-03-18 MED ORDER — PROPOFOL 500 MG/50ML IV EMUL
INTRAVENOUS | Status: DC | PRN
  Administered 2023-03-18: 150 ug/kg/min via INTRAVENOUS

## 2023-03-18 NOTE — H&P (Signed)
Primary Care Physician:  Sheela Stack Primary Gastroenterologist:  Dr. Marletta Lor  Pre-Procedure History & Physical: HPI:  Robin Bender is a 47 y.o. female is here for first ever colonoscopy for colon cancer screening purposes.  Patient denies any family history of colorectal cancer.  No melena or hematochezia.  No abdominal pain or unintentional weight loss.  No change in bowel habits.  Overall feels well from a GI standpoint.  Past Medical History:  Diagnosis Date   Anemia    Anxiety    Arthritis    Bipolar disorder (HCC)    Depression    Fatigue    Fibromyalgia    Headache    Pneumonia due to COVID-19 virus 07/30/2020   [facility]   PTSD (post-traumatic stress disorder)    Sleep apnea     Past Surgical History:  Procedure Laterality Date   ABDOMINAL HYSTERECTOMY     APPENDECTOMY     CARPAL TUNNEL RELEASE Bilateral    CESAREAN SECTION  2002, 2004   DE QUERVAIN'S RELEASE Left 07/03/2020   DORSAL COMPARTMENT RELEASE Right 09/01/2020   Procedure: RIGHT 1ST DORSAL COMPARTMENT RELEASE;  Surgeon: Eldred Manges, MD;  Location: [facility];  Service: Orthopedics;  Laterality: Right;   LAPAROSCOPIC APPENDECTOMY N/A 05/19/2014   Procedure: APPENDECTOMY LAPAROSCOPIC;  Surgeon: Dalia Heading, MD;  Location: [facility];  Service: General;  Laterality: N/A;   ovarian cyst removed     UMBILICAL HERNIA REPAIR N/A 05/22/2021   Procedure: HERNIA REPAIR UMBILICAL ADULT W/MESH;  Surgeon: Lucretia Roers, MD;  Location: [facility];  Service: General;  Laterality: N/A;   VULVECTOMY PARTIAL Left 02/04/2021   Procedure: VULVECTOMY PARTIAL;  Surgeon: Lazaro Arms, MD;  Location: [facility];  Service: Gynecology;  Laterality: Left;   WISDOM TOOTH EXTRACTION      Prior to Admission medications   Medication Sig Start Date End Date Taking? Authorizing Provider  albuterol (VENTOLIN HFA) 108 (90 Base) MCG/ACT inhaler Inhale 2 puffs into the lungs every 6 (six) hours as needed  for wheezing or shortness of breath.   Yes [provider]  atorvastatin (LIPITOR) 20 MG tablet TAKE ONE (1) TABLET EACH DAY 01/25/23  Yes Runell Gess, MD  Calcium Carb-Cholecalciferol (CALCIUM 600+D) 600-20 MG-MCG TABS Take 1 tablet by mouth daily.   Yes [provider]  Calcium-Vitamin D 600-5 MG-MCG TABS Take by mouth.   Yes [provider]  Cholecalciferol (VITAMIN D3) 10 MCG (400 UNIT) CAPS Take 400 Units by mouth daily.   Yes [provider]  estradiol (ESTRACE) 1 MG tablet Take 1 mg by mouth daily. 12/01/22  Yes [provider]  HYDROcodone-acetaminophen (NORCO) 7.5-325 MG tablet Take 1 tablet by mouth 2 (two) times daily.   Yes [provider]  metFORMIN (GLUCOPHAGE-XR) 500 MG 24 hr tablet Take 500 mg by mouth at bedtime. 10/24/20  Yes [provider]  montelukast (SINGULAIR) 10 MG tablet Take 10 mg by mouth daily.   Yes [provider]    Allergies as of 02/10/2023 - Review Complete 02/10/2023  Allergen Reaction Noted   Codeine Other (See Comments) 01/20/2012   Demerol Hives 01/20/2012   Ampicillin-sulbactam sodium Rash 05/19/2014    Family History  Problem Relation Age of Onset   Cancer Mother        cervical   Hypertension Mother    Heart disease Mother    Arthritis/Rheumatoid Mother    Colon polyps Mother    Colon  polyps Maternal Grandmother     Social History   Socioeconomic History   Marital status: Media planner    Spouse name: Not on file   Number of children: 2   Years of education: Not on file   Highest education level: Not on file  Occupational History   Not on file  Tobacco Use   Smoking status: Former    Packs/day: 0.25    Years: 22.00    Additional pack years: 0.00    Total pack years: 5.50    Types: Cigarettes    Quit date: 07/30/2020    Years since quitting: 2.6    Passive exposure: Past   Smokeless tobacco: Never   Tobacco comments:    reduce the # of cigarettes    Vaping Use   Vaping Use: Never used  Substance and Sexual Activity   Alcohol use: No   Drug use: No   Sexual activity: Not Currently    Partners: Male    Birth control/protection: Surgical    Comment: hyst  Other Topics Concern   Not on file  Social History Narrative   Not on file   Social Determinants of Health   Financial Resource Strain: Not on file  Food Insecurity: Not on file  Transportation Needs: Not on file  Physical Activity: Not on file  Stress: Not on file  Social Connections: Not on file  Intimate Partner Violence: Not on file    Review of Systems: See HPI, otherwise negative ROS  Physical Exam: Vital Bender in last 24 hours: Temp:  [97.9 F (36.6 C)] 97.9 F (36.6 C) (05/10 0801) Resp:  [20] 20 (05/10 0801) BP: (139)/(86) 139/86 (05/10 0801) SpO2:  [94 %] 94 % (05/10 0801) Weight:  [139.3 kg] 139.3 kg (05/10 0801)   General:   Alert,  Well-developed, well-nourished, pleasant and cooperative in NAD Head:  Normocephalic and atraumatic. Eyes:  Sclera clear, no icterus.   Conjunctiva pink. Ears:  Normal auditory acuity. Nose:  No deformity, discharge,  or lesions. Msk:  Symmetrical without gross deformities. Normal posture. Extremities:  Without clubbing or edema. Neurologic:  Alert and  oriented x4;  grossly normal neurologically. Skin:  Intact without significant lesions or rashes. Psych:  Alert and cooperative. Normal mood and affect.  Impression/Plan: Robin Bender is here for a colonoscopy to be performed for colon cancer screening purposes.  The risks of the procedure including infection, bleed, or perforation as well as benefits, limitations, alternatives and imponderables have been reviewed with the patient. Questions have been answered. All parties agreeable.

## 2023-03-18 NOTE — Transfer of Care (Signed)
Immediate Anesthesia Transfer of Care Note  Patient: Robin Bender  Procedure(s) Performed: COLONOSCOPY WITH PROPOFOL  Patient Location: Endoscopy Unit  Anesthesia Type:General  Level of Consciousness: awake, alert , and patient cooperative  Airway & Oxygen Therapy: Patient Spontanous Breathing and Patient connected to nasal cannula oxygen  Post-op Assessment: Report given to RN, Post -op Vital signs reviewed and stable, and Patient moving all extremities  Post vital signs: Reviewed and stable  Last Vitals:  Vitals Value Taken Time  BP 122/72 03/18/23 0919  Temp 36.5 C 03/18/23 0919  Pulse 83 03/18/23 0919  Resp 17 03/18/23 0919  SpO2 95 % 03/18/23 0919    Last Pain:  Vitals:   03/18/23 0919  TempSrc: Oral  PainSc: 0-No pain      Patients Stated Pain Goal: 8 (03/18/23 0801)  Complications: No notable events documented.

## 2023-03-18 NOTE — Op Note (Signed)
[facility] Patient Name: Robin Bender Procedure Date: 03/18/2023 8:43 AM MRN: [id number] Date of Birth: [birth year] Attending MD: Hennie Duos. Marletta Lor , [place], [id number] CSN: [id number] Age: 47 Admit Type: Outpatient Procedure:                Colonoscopy Indications:              Screening for colorectal malignant neoplasm Providers:                Hennie Duos. Marletta Lor, DO, Nena Polio, RN, Pandora Leiter, Technician Referring MD:              Medicines:                See the Anesthesia note for documentation of the                            administered medications Complications:            No immediate complications. Estimated Blood Loss:     Estimated blood loss: none. Procedure:                Pre-Anesthesia Assessment:                           - The anesthesia plan was to use monitored                            anesthesia care (MAC).                           After obtaining informed consent, the colonoscope                            was passed under direct vision. Throughout the                            procedure, the patient's blood pressure, pulse, and                            oxygen saturations were monitored continuously. The                            [id number]-HQ190L ([id number]) scope was introduced through                            the anus and advanced to the the cecum, identified                            by appendiceal orifice and ileocecal valve. The                            colonoscopy was performed without difficulty. The                            patient tolerated the procedure well. The  quality                            of the bowel preparation was evaluated using the                            BBPS [facility] Bowel Preparation Scale) with scores                            of: Right Colon = 2 (minor amount of residual                            staining, small fragments of stool and/or opaque                            liquid, but mucosa  seen well), Transverse Colon = 3                            (entire mucosa seen well with no residual staining,                            small fragments of stool or opaque liquid) and Left                            Colon = 2 (minor amount of residual staining, small                            fragments of stool and/or opaque liquid, but mucosa                            seen well). The total BBPS score equals 7. The                            quality of the bowel preparation was good. Scope In: 9:00:01 AM Scope Out: 9:13:29 AM Scope Withdrawal Time: 0 hours 11 minutes 26 seconds  Total Procedure Duration: 0 hours 13 minutes 28 seconds  Findings:      Non-bleeding internal hemorrhoids were found during endoscopy.      The exam was otherwise without abnormality. Impression:               - Non-bleeding internal hemorrhoids.                           - The examination was otherwise normal.                           - No specimens collected. Moderate Sedation:      Per Anesthesia Care Recommendation:           - Patient has a contact number available for                            emergencies. The signs and symptoms of potential  delayed complications were discussed with the                            patient. Return to normal activities tomorrow.                            Written discharge instructions were provided to the                            patient.                           - Resume previous diet.                           - Continue present medications.                           - Repeat colonoscopy in 10 years for screening                            purposes.                           - Return to GI clinic PRN. Procedure Code(s):        --- Professional ---                           Z6109, Colorectal cancer screening; colonoscopy on                            individual not meeting criteria for high risk Diagnosis Code(s):        ---  Professional ---                           Z12.11, Encounter for screening for malignant                            neoplasm of colon                           K64.8, Other hemorrhoids CPT copyright 2022 American Medical Association. All rights reserved. The codes documented in this report are preliminary and upon coder review may  be revised to meet current compliance requirements. Hennie Duos. Marletta Lor, DO Hennie Duos. Marletta Lor, DO 03/18/2023 9:15:48 AM This report has been signed electronically. Number of Addenda: 0

## 2023-03-18 NOTE — Anesthesia Preprocedure Evaluation (Signed)
Anesthesia Evaluation  Patient identified by MRN, date of birth, ID band Patient awake    Reviewed: Allergy & Precautions, H&P , NPO status , Patient's Chart, lab work & pertinent test results, reviewed documented beta blocker date and time   Airway Mallampati: II  TM Distance: >3 FB Neck ROM: full    Dental no notable dental hx.    Pulmonary neg pulmonary ROS, sleep apnea , pneumonia, former smoker   Pulmonary exam normal breath sounds clear to auscultation       Cardiovascular Exercise Tolerance: Good negative cardio ROS  Rhythm:regular Rate:Normal     Neuro/Psych  Headaches PSYCHIATRIC DISORDERS Anxiety Depression Bipolar Disorder    Neuromuscular disease negative neurological ROS  negative psych ROS   GI/Hepatic negative GI ROS, Neg liver ROS,,,  Endo/Other  diabetes  Morbid obesity  Renal/GU negative Renal ROS  negative genitourinary   Musculoskeletal   Abdominal   Peds  Hematology negative hematology ROS (+) Blood dyscrasia, anemia   Anesthesia Other Findings   Reproductive/Obstetrics negative OB ROS                             Anesthesia Physical Anesthesia Plan  ASA: 3  Anesthesia Plan: General   Post-op Pain Management:    Induction:   PONV Risk Score and Plan: Propofol infusion  Airway Management Planned:   Additional Equipment:   Intra-op Plan:   Post-operative Plan:   Informed Consent: I have reviewed the patients History and Physical, chart, labs and discussed the procedure including the risks, benefits and alternatives for the proposed anesthesia with the patient or authorized representative who has indicated his/her understanding and acceptance.     Dental Advisory Given  Plan Discussed with: CRNA  Anesthesia Plan Comments:        Anesthesia Quick Evaluation

## 2023-03-18 NOTE — Discharge Instructions (Addendum)

## 2023-03-19 NOTE — Anesthesia Postprocedure Evaluation (Signed)
Anesthesia Post Note  Patient: Robin Bender  Procedure(s) Performed: COLONOSCOPY WITH PROPOFOL  Patient location during evaluation: Phase II Anesthesia Type: General Level of consciousness: awake Pain management: pain level controlled Vital Signs Assessment: post-procedure vital signs reviewed and stable Respiratory status: spontaneous breathing and respiratory function stable Cardiovascular status: blood pressure returned to baseline and stable Postop Assessment: no headache and no apparent nausea or vomiting Anesthetic complications: no Comments: Late entry   No notable events documented.   Last Vitals:  Vitals:   03/18/23 0801 03/18/23 0919  BP: 139/86 122/72  Pulse:  83  Resp: 20 17  Temp: 36.6 C 36.5 C  SpO2: 94% 95%    Last Pain:  Vitals:   03/18/23 0919  TempSrc: Oral  PainSc: 0-No pain                 Windell Norfolk

## 2023-03-21 ENCOUNTER — Encounter: Payer: Medicaid Other | Admitting: Cardiology

## 2023-03-24 ENCOUNTER — Encounter (HOSPITAL_COMMUNITY): Payer: Self-pay | Admitting: Internal Medicine

## 2023-03-25 ENCOUNTER — Encounter: Payer: Medicaid Other | Admitting: Cardiology

## 2023-04-11 DIAGNOSIS — H8110 Benign paroxysmal vertigo, unspecified ear: Secondary | ICD-10-CM | POA: Insufficient documentation

## 2023-04-27 ENCOUNTER — Ambulatory Visit: Payer: Medicaid Other | Admitting: Internal Medicine

## 2023-04-27 ENCOUNTER — Encounter: Payer: Self-pay | Admitting: Internal Medicine

## 2023-04-27 VITALS — BP 101/70 | HR 100 | Ht 63.0 in | Wt 312.2 lb

## 2023-04-27 DIAGNOSIS — R058 Other specified cough: Secondary | ICD-10-CM | POA: Diagnosis not present

## 2023-04-27 DIAGNOSIS — R0609 Other forms of dyspnea: Secondary | ICD-10-CM | POA: Diagnosis not present

## 2023-04-27 MED ORDER — FAMOTIDINE 20 MG PO TABS
ORAL_TABLET | ORAL | 11 refills | Status: DC
Start: 2023-04-27 — End: 2024-01-11

## 2023-04-27 MED ORDER — PANTOPRAZOLE SODIUM 40 MG PO TBEC
40.0000 mg | DELAYED_RELEASE_TABLET | Freq: Every day | ORAL | 2 refills | Status: DC
Start: 2023-04-27 — End: 2024-01-11

## 2023-04-27 NOTE — Patient Instructions (Addendum)
Please remember to go to the lab department   for your tests - we will call you with the results when they are available.      Pantoprazole (protonix) 40 mg   Take  30-60 min before first meal of the day and Pepcid (famotidine)  20 mg after supper until return to office - this is the best way to tell whether stomach acid is contributing to your problem.    GERD (REFLUX)  is an extremely common cause of respiratory symptoms just like yours , many times with no obvious heartburn at all.    It can be treated with medication, but also with lifestyle changes including elevation of the head of your bed (ideally with 6 -8inch blocks under the headboard of your bed),  Smoking cessation, avoidance of late meals, excessive alcohol, and avoid fatty foods, chocolate, peppermint, colas, red wine, and acidic juices such as orange juice.  NO MINT OR MENTHOL PRODUCTS SO NO COUGH DROPS  USE SUGARLESS CANDY INSTEAD (Jolley ranchers or Stover's or Life Savers) or even ice chips will also do - the key is to swallow to prevent all throat clearing. NO OIL BASED VITAMINS - use powdered substitutes.  Avoid fish oil when coughing.    Call ENT today and explain your ears are hurting, you can't breathe through your nose  and you are dizzy   Regarding ventolin (albuterol)  - it may not be needed once you address your upper airway problems but here is what I recommend  Only use your albuterol as a rescue medication to be used if you can't catch your breath by resting or doing a relaxed purse lip breathing pattern.  - The less you use it, the better it will work when you need it. - Ok to use up to 2 puffs  every 4 hours if you must but call for immediate appointment if use goes up over your usual need - Don't leave home without it !!  (think of it like the spare tire or starter fluid  for your car)   Also  Ok to try albuterol x 2 puffs 15 min before an activity (on alternating days)  that you know would usually make you  short of breath and see if it makes any difference and if makes none then don't take albuterol after activity unless you can't catch your breath as this means it's the resting that helps, not the albuterol.      Pulmonary follow is as needed

## 2023-04-27 NOTE — Assessment & Plan Note (Addendum)
Onset with covid 19 07/2022 required 02 for sev weeks p d/c - 04/27/2023   Walked on RA  x  3  lap(s) =  approx 450  ft  @ mod pace, stopped due to end of study with lowest 02 sats 90% with minimal doe that resolved immediately a stopping    Symptoms are   disproportionate to objective findings and not clear to what extent this is actually a pulmonary  problem but pt does appear to have difficult to sort out respiratory symptoms of unknown origin for which  DDX  = almost all start with A and  include Adherence, Ace Inhibitors, Acid Reflux, Active Sinus Disease, Alpha 1 Antitripsin deficiency, Anxiety masquerading as Airways dz,  ABPA,  Allergy(esp in young), Aspiration (esp in elderly), Adverse effects of meds,  Active smoking or Vaping, A bunch of PE's/clot burden (a few small clots can't cause this syndrome unless there is already severe underlying pulm or vascular dz with poor reserve),  Anemia or thyroid disorder, plus two Bs  = Bronchiectasis and Beta blocker use..and one C= CHF    Most likely dx's are bolded and separately addressed   ? Anemia/ thyroid disorder > ruled out by labs toady   ? PE's > D dimer nl - while a normal  or high normal value (seen commonly in the elderly or chronically ill)  may miss small peripheral pe, the clot burden with sob is moderately high and the d dimer  has a very high neg pred value if used in this setting.    ? Chf > nl bnp

## 2023-04-27 NOTE — Progress Notes (Signed)
Robin Bender, female    DOB: June 04, 1976    MRN: [id number]   Brief patient profile:  46  yowf  quit smoking 07/23/2020 at onset of covid  referred to pulmonary clinic in [facility]  04/27/2023 by Wayland Denis, PA in [place] for post covid sinus problems and cough/ doe.  Baseline wt 275 prior to COVID   Onset sinus problems around 2016 seasonal spring mostly rx otc's then hosp in [place] for covid (not prior vac) 07/23/22 hosp about a week > d/c on 02 x sev weeks with refractory nasal congestion pnds and chronic minimally prod cough slt grey   OSA on CPAP x 2023 > can't use it due to nasal obst > has been referred to Dr Mayford Knife July 1st   ENT eval 07/23/22 [place] > neg ear and larynx exam > rec trial of singulair trial > one month no better    History of Present Illness  04/27/2023  Pulmonary/ 1st office eval/ Adreana Coull / [place] Office  Chief Complaint  Patient presents with   Consult  Dyspnea:  able to walk prior to vertigo but was able to do a treadmill 2 weeks prior to OV  x 30 min @ level  Cough: p wakes  Sleep: bed is flat/ one pillow  SABA use: ventolin x sev weeks  02: none   No obvious day to day or daytime pattern/variability or assoc excess/ purulent sputum or mucus plugs or hemoptysis or cp or chest tightness, subjective wheeze or overt sinus or hb symptoms.   Sleeping  without nocturnal  or early am exacerbation  of respiratory  c/o's or need for noct saba. Also denies any obvious fluctuation of symptoms with weather or environmental changes or other aggravating or alleviating factors except as outlined above   No unusual exposure hx or h/o childhood pna/ asthma or knowledge of premature birth.  Current Allergies, Complete Past Medical History, Past Surgical History, Family History, and Social History were reviewed in [employer] record.  ROS  The following are not active complaints unless bolded Hoarseness, sore throat, dysphagia= globus,  dental problems, itching, sneezing,  nasal congestion or discharge of excess mucus or purulent secretions, ear ache,   fever, chills, sweats, unintended wt loss or wt gain, classically pleuritic or exertional cp,  orthopnea pnd or arm/hand swelling  or leg swelling, presyncope, palpitations, abdominal pain, anorexia, nausea, vomiting, diarrhea  or change in bowel habits or change in bladder habits, change in stools or change in urine, dysuria, hematuria,  rash, arthralgias, visual complaints, headache, numbness, weakness or ataxia or problems with walking or coordination,  change in mood or  memory.             Past Medical History:  Diagnosis Date   Anemia    Anxiety    Arthritis    Bipolar disorder (HCC)    Depression    Fatigue    Fibromyalgia    Headache    Pneumonia due to COVID-19 virus 07/30/2020   [facility]   PTSD (post-traumatic stress disorder)    Sleep apnea     Outpatient Medications Prior to Visit  Medication Sig Dispense Refill   albuterol (VENTOLIN HFA) 108 (90 Base) MCG/ACT inhaler Inhale 2 puffs into the lungs every 6 (six) hours as needed for wheezing or shortness of breath.     atorvastatin (LIPITOR) 20 MG tablet TAKE ONE (1) TABLET EACH DAY 60 tablet 10   buPROPion (WELLBUTRIN XL) 150 MG 24  hr tablet Take 150 mg by mouth every morning.     Cholecalciferol (VITAMIN D3) 10 MCG (400 UNIT) CAPS Take 400 Units by mouth daily.     estradiol (ESTRACE) 1 MG tablet Take 1 mg by mouth daily.     HYDROcodone-acetaminophen (NORCO) 7.5-325 MG tablet Take 1 tablet by mouth 2 (two) times daily.     metFORMIN (GLUCOPHAGE-XR) 500 MG 24 hr tablet Take 500 mg by mouth at bedtime.     Calcium Carb-Cholecalciferol (CALCIUM 600+D) 600-20 MG-MCG TABS Take 1 tablet by mouth daily.     Calcium-Vitamin D 600-5 MG-MCG TABS Take by mouth.     montelukast (SINGULAIR) 10 MG tablet Not using          Objective:     BP 101/70   Pulse 100   Ht 5\' 3"  (1.6 m)   Wt (!) 312 lb 3.2 oz  (141.6 kg)   SpO2 96%   BMI 55.30 kg/m   SpO2: 96 %  RA  MO (by bmi) amb wf nad with occ throat clearing     HEENT : Oropharynx  clear     Nasal turbinates nl / TMs nl    NECK :  without  apparent JVD/ palpable Nodes/TM    LUNGS: no acc muscle use,  Nl contour chest which is clear to A and P bilaterally without cough on insp or exp maneuvers   CV:  RRR  no s3 or murmur or increase in P2, and no edema   ABD:  obese soft and nontender with nl inspiratory excursion in the supine position. No bruits or organomegaly appreciated   MS:  Nl gait/ ext warm without deformities Or obvious joint restrictions  calf tenderness, cyanosis or clubbing    SKIN: warm and dry without lesions    NEURO:  alert, approp, nl sensorium with  no motor or cerebellar deficits apparent.       I personally reviewed images and agree with radiology impression as follows:    CT head /sinus   04/11/23 neg     I personally reviewed images and agree with radiology impression as follows:  CXR:   03/23/23  No evidence of active cardiopulmonary disease.     Labs ordered/ reviewed:      Chemistry      Component Value Date/Time   NA 141 01/25/2023 1102   K 4.3 01/25/2023 1102   CL 102 01/25/2023 1102   CO2 22 01/25/2023 1102   BUN 11 01/25/2023 1102   CREATININE 0.53 (L) 01/25/2023 1102      Component Value Date/Time   CALCIUM 9.2 01/25/2023 1102   ALKPHOS 102 01/25/2023 1102   AST 14 01/25/2023 1102   ALT 14 01/25/2023 1102   BILITOT 0.5 01/25/2023 1102        Lab Results  Component Value Date   WBC 10.2 04/27/2023   HGB 13.9 04/27/2023   HCT 42.1 04/27/2023   MCV 88 04/27/2023   PLT 267 04/27/2023     Lab Results  Component Value Date   DDIMER 0.31 04/27/2023      Lab Results  Component Value Date   TSH 2.200 04/27/2023    Pro-BNP    03/23/23  =  61   Lab Results  Component Value Date   ESRSEDRATE 39 (H) 04/27/2023         Assessment   Upper airway cough  syndrome Onset ? 2016 much worse since covid 19 07/23/22  - ENT eval 07/23/22  [facility] > neg ear and larynx exam > rec trial of singulair trial > one month no better so pt d/c'd  - CT head /sinus   04/11/23 neg  assoc with vertigo  - Allergy screen 04/27/2023 >  Eos 0.3 /  IgE  123  This problem was mostly seasonal until covid now waxex and wanes and presently worse (says on day ent eval was feeling fine but rec gerd rx next) so I rec max gerd rx and f/u ent with allergy eval p that.  DOE (dyspnea on exertion) Onset with covid 19 07/2022 required 02 for sev weeks p d/c - 04/27/2023   Walked on RA  x  3  lap(s) =  approx 450  ft  @ mod pace, stopped due to end of study with lowest 02 sats 90% with minimal doe that resolved immediately a stopping    Symptoms are   disproportionate to objective findings and not clear to what extent this is actually a pulmonary  problem but pt does appear to have difficult to sort out respiratory symptoms of unknown origin for which  DDX  = almost all start with A and  include Adherence, Ace Inhibitors, Acid Reflux, Active Sinus Disease, Alpha 1 Antitripsin deficiency, Anxiety masquerading as Airways dz,  ABPA,  Allergy(esp in young), Aspiration (esp in elderly), Adverse effects of meds,  Active smoking or Vaping, A bunch of PE's/clot burden (a few small clots can't cause this syndrome unless there is already severe underlying pulm or vascular dz with poor reserve),  Anemia or thyroid disorder, plus two Bs  = Bronchiectasis and Beta blocker use..and one C= CHF    Most likely dx's are bolded and separately addressed   ? Anemia/ thyroid disorder > ruled out by labs toady   ? PE's > D dimer nl - while a normal  or high normal value (seen commonly in the elderly or chronically ill)  may miss small peripheral pe, the clot burden with sob is moderately high and the d dimer  has a very high neg pred value if used in this setting.    ? Chf > nl bnp     Morbid (severe) obesity due  to excess calories (HCC) Complicated by AODM/ OSA   Body mass index is 55.3 kg/m.  -   Lab Results  Component Value Date   TSH 1.327 01/16/2017      Contributing to doe and risk of GERD and risk of dvt/pe >>>   reviewed the need and the process to achieve and maintain neg calorie balance > defer f/u primary care including intermittently monitoring thyroid status     Each maintenance medication was reviewed in detail including emphasizing most importantly the difference between maintenance and prns and under what circumstances the prns are to be triggered using an action plan format where appropriate.  Total time for H and P, chart review, counseling,  directly observing portions of ambulatory 02 saturation study/ and generating customized AVS unique to this office visit / same day charting = > 60 min new pt eval                 Sandrea Hughs, MD 04/27/2023

## 2023-04-27 NOTE — Assessment & Plan Note (Addendum)
Complicated by AODM/ OSA   Body mass index is 55.3 kg/m.  -   Lab Results  Component Value Date   TSH 1.327 01/16/2017      Contributing to doe and risk of GERD and risk of dvt/pe >>>   reviewed the need and the process to achieve and maintain neg calorie balance > defer f/u primary care including intermittently monitoring thyroid status     Each maintenance medication was reviewed in detail including emphasizing most importantly the difference between maintenance and prns and under what circumstances the prns are to be triggered using an action plan format where appropriate.  Total time for H and P, chart review, counseling,  directly observing portions of ambulatory 02 saturation study/ and generating customized AVS unique to this office visit / same day charting = > 60 min new pt eval

## 2023-04-27 NOTE — Assessment & Plan Note (Addendum)
Onset ? 2016 much worse since covid 19 07/23/22  - ENT eval 07/23/22 [facility] > neg ear and larynx exam > rec trial of singulair trial > one month no better so pt d/c'd  - CT head /sinus   04/11/23 neg  assoc with vertigo  - Allergy screen 04/27/2023 >  Eos 0.3 /  IgE  123  This problem was mostly seasonal until covid now waxex and wanes and presently worse (says on day ent eval was feeling fine but rec gerd rx next) so I rec max gerd rx and f/u ent with allergy eval p that.

## 2023-04-28 LAB — D-DIMER, QUANTITATIVE: D-DIMER: 0.31 mg/L FEU (ref 0.00–0.49)

## 2023-04-28 LAB — TSH: TSH: 2.2 u[IU]/mL (ref 0.450–4.500)

## 2023-04-28 LAB — SEDIMENTATION RATE: Sed Rate: 39 mm/hr — ABNORMAL HIGH (ref 0–32)

## 2023-04-29 ENCOUNTER — Encounter: Payer: Self-pay | Admitting: Internal Medicine

## 2023-04-30 LAB — CBC WITH DIFFERENTIAL/PLATELET
Basophils Absolute: 0.1 10*3/uL (ref 0.0–0.2)
Basos: 1 %
EOS (ABSOLUTE): 0.3 10*3/uL (ref 0.0–0.4)
Eos: 3 %
Hematocrit: 42.1 % (ref 34.0–46.6)
Hemoglobin: 13.9 g/dL (ref 11.1–15.9)
Immature Grans (Abs): 0 10*3/uL (ref 0.0–0.1)
Immature Granulocytes: 0 %
Lymphocytes Absolute: 2.8 10*3/uL (ref 0.7–3.1)
Lymphs: 27 %
MCH: 29 pg (ref 26.6–33.0)
MCHC: 33 g/dL (ref 31.5–35.7)
MCV: 88 fL (ref 79–97)
Monocytes Absolute: 0.6 10*3/uL (ref 0.1–0.9)
Monocytes: 6 %
Neutrophils Absolute: 6.4 10*3/uL (ref 1.4–7.0)
Neutrophils: 63 %
Platelets: 267 10*3/uL (ref 150–450)
RBC: 4.8 x10E6/uL (ref 3.77–5.28)
RDW: 13.2 % (ref 11.7–15.4)
WBC: 10.2 10*3/uL (ref 3.4–10.8)

## 2023-04-30 LAB — IGE: IgE (Immunoglobulin E), Serum: 123 IU/mL (ref 6–495)

## 2023-05-09 ENCOUNTER — Encounter: Payer: Medicaid Other | Admitting: Cardiology

## 2023-05-11 NOTE — Progress Notes (Signed)
Called the pt and there was no answer- LMTCB    

## 2023-05-13 DIAGNOSIS — R42 Dizziness and giddiness: Secondary | ICD-10-CM | POA: Insufficient documentation

## 2023-05-13 DIAGNOSIS — J342 Deviated nasal septum: Secondary | ICD-10-CM | POA: Insufficient documentation

## 2023-05-13 DIAGNOSIS — M2669 Other specified disorders of temporomandibular joint: Secondary | ICD-10-CM | POA: Insufficient documentation

## 2023-07-04 NOTE — Progress Notes (Deleted)
Robin Bender, female    DOB: 03/13/1976    MRN: [id number]   Brief patient profile:  46  yowf  quit smoking 07/23/2020 at onset of covid  referred to pulmonary clinic in [facility]  04/27/2023 by Wayland Denis, PA in [place] for post covid sinus problems and cough/ doe.  Baseline wt 275 prior to COVID   Onset sinus problems around 2016 seasonal spring mostly rx otc's then hosp in [place] for covid (not prior vac) 07/23/22 hosp about a week > d/c on 02 x sev weeks with refractory nasal congestion pnds and chronic minimally prod cough slt grey   OSA on CPAP x 2023 > can't use it due to nasal obst > has been referred to Dr Mayford Knife July 1st   ENT eval 07/23/22 [facility] > neg ear and larynx exam > rec trial of singulair trial > one month no better    History of Present Illness  04/27/2023  Pulmonary/ 1st office eval/ Shelly Shoultz / [place] [facility]  Chief Complaint  Patient presents with   Consult  Dyspnea:  able to walk prior to vertigo but was able to do a treadmill 2 weeks prior to OV  x 30 min @ level  Cough: p wakes  Sleep: bed is flat/ one pillow  SABA use: ventolin x sev weeks  02: none  Rec Pantoprazole (protonix) 40 mg   Take  30-60 min before first meal of the day and Pepcid (famotidine)  20 mg after supper until return to office  GERD diet reviewed, bed blocks rec  Call ENT today and explain your ears are hurting, you can't breathe through your nose  and you are dizzy  Regarding ventolin (albuterol)  - it may not be needed once you address your upper airway problems but here is what I recommend  Only use your albuterol as a rescue medication Also  Ok to try albuterol x 2 puffs 15 min before an activity (on alternating days)  that you know would usually make you short of breath  Pulmonary follow is as needed   Allergy screen 04/27/2023 >  Eos 0.3 /  IgE  123   07/05/2023  f/u ov/[place] office/Azura Tufaro re: *** maint on ***  No chief complaint on file.   Dyspnea:  *** Cough:  *** Sleeping: ***   resp cc  SABA use: *** 02: ***  Lung cancer screening: ***   No obvious day to day or daytime variability or assoc excess/ purulent sputum or mucus plugs or hemoptysis or cp or chest tightness, subjective wheeze or overt sinus or hb symptoms.    Also denies any obvious fluctuation of symptoms with weather or environmental changes or other aggravating or alleviating factors except as outlined above   No unusual exposure hx or h/o childhood pna/ asthma or knowledge of premature birth.  Current Allergies, Complete Past Medical History, Past Surgical History, Family History, and Social History were reviewed in [employer] record.  ROS  The following are not active complaints unless bolded Hoarseness, sore throat, dysphagia, dental problems, itching, sneezing,  nasal congestion or discharge of excess mucus or purulent secretions, ear ache,   fever, chills, sweats, unintended wt loss or wt gain, classically pleuritic or exertional cp,  orthopnea pnd or arm/hand swelling  or leg swelling, presyncope, palpitations, abdominal pain, anorexia, nausea, vomiting, diarrhea  or change in bowel habits or change in bladder habits, change in stools or change in urine, dysuria, hematuria,  rash, arthralgias, visual  complaints, headache, numbness, weakness or ataxia or problems with walking or coordination,  change in mood or  memory.        No outpatient medications have been marked as taking for the 07/05/23 encounter (Appointment) with Nyoka Cowden, MD.               Past Medical History:  Diagnosis Date   Anemia    Anxiety    Arthritis    Bipolar disorder (HCC)    Depression    Fatigue    Fibromyalgia    Headache    Pneumonia due to COVID-19 virus 07/30/2020   [facility]   PTSD (post-traumatic stress disorder)    Sleep apnea        Objective:     Wt Readings from Last 3 Encounters:  04/27/23 (!) 312 lb 3.2 oz (141.6 kg)  03/18/23 (!)  307 lb (139.3 kg)  02/10/23 (!) 310 lb (140.6 kg)      Vital Bender reviewed  07/05/2023  - Note at rest 02 sats  ***% on ***   General appearance:    ***       Assessment

## 2023-07-05 ENCOUNTER — Ambulatory Visit: Payer: Medicaid Other | Admitting: Internal Medicine

## 2023-07-06 ENCOUNTER — Telehealth (HOSPITAL_BASED_OUTPATIENT_CLINIC_OR_DEPARTMENT_OTHER): Payer: Self-pay

## 2023-07-06 DIAGNOSIS — G4733 Obstructive sleep apnea (adult) (pediatric): Secondary | ICD-10-CM

## 2023-07-06 DIAGNOSIS — R002 Palpitations: Secondary | ICD-10-CM

## 2023-07-06 NOTE — Telephone Encounter (Signed)
Authorization pending for insurance approval for BiPAP Titration 07/06/23.

## 2023-07-27 NOTE — Progress Notes (Signed)
Robin Bender, female    DOB: September 05, 1976    MRN: [id number]   Brief patient profile:  47  yowf  quit smoking 07/23/2020 at onset of covid  referred to pulmonary clinic in [facility]  04/27/2023 by Wayland Denis, PA in [place] for post covid sinus problems and cough/ doe.  Baseline wt 275 prior to COVID   Onset sinus problems around 2016 seasonal spring mostly rx otc's then hosp in [place] for covid (not prior vac) 07/23/22 hosp about a week > d/c on 02 x sev weeks with refractory nasal congestion pnds and chronic minimally prod cough slt grey   OSA on CPAP x 2023 > can't use it due to nasal obst > has been referred to Dr Mayford Knife July 1st   ENT eval 07/23/22 [facility] > neg ear and larynx exam > rec trial of singulair trial > one month no better    History of Present Illness  04/27/2023  Pulmonary/ 1st office eval/ Kewan Mcnease / [place] [facility] on symbiocrt 160  Chief Complaint  Patient presents with   Consult  Dyspnea:  able to walk prior to vertigo but was able to do a treadmill 2 weeks prior to OV  x 30 min @ level  Cough: p wakes  Sleep: bed is flat/ one pillow  SABA use: ventolin x sev weeks  02: none  Rec Pantoprazole (protonix) 40 mg   Take  30-60 min before first meal of the day and Pepcid (famotidine)  20 mg after supper until return to office  GERD diet reviewed, bed blocks rec  Call ENT today and explain your ears are hurting, you can't breathe through your nose  and you are dizzy  Regarding ventolin (albuterol)  - it may not be needed once you address your upper airway problems but here is what I recommend  Only use your albuterol as a rescue medication Also  Ok to try albuterol x 2 puffs 15 min before an activity (on alternating days)  that you know would usually make you short of breath  Pulmonary follow is as needed   Allergy screen 04/27/2023 >  Eos 0.3 /  IgE  123   07/28/2023  f/u ov/[place] office/[place] re: UACS/MO with doe maint on symbicort 160  prn   Chief  Complaint  Patient presents with   Follow-up    Follow up  Dyspnea:  back at gym doing  treadmill / recumbent bik 20 min each / 2-3 x weekly  Cough: mostly in am's / overall better but still c/o pnds /mucoid  Sleeping: flat bed/ one pillow s    resp cc  SABA use: rarely  02: none     No obvious day to day or daytime variability or assoc e purulent sputum or mucus plugs or hemoptysis or cp or chest tightness, subjective wheeze or overt   hb symptoms.    Also denies any obvious fluctuation of symptoms with weather or environmental changes or other aggravating or alleviating factors except as outlined above   No unusual exposure hx or h/o childhood pna/ asthma or knowledge of premature birth.  Current Allergies, Complete Past Medical History, Past Surgical History, Family History, and Social History were reviewed in [employer] record.  ROS  The following are not active complaints unless bolded Hoarseness, sore throat/globus, dysphagia, dental problems, itching, sneezing,  nasal congestion or snsation of discharge of excess mucus or purulent secretions, ear ache,   fever, chills, sweats, unintended wt loss or  wt gain, classically pleuritic or exertional cp,  orthopnea pnd or arm/hand swelling  or leg swelling, presyncope, palpitations, abdominal pain, anorexia, nausea, vomiting, diarrhea  or change in bowel habits or change in bladder habits, change in stools or change in urine, dysuria, hematuria,  rash, arthralgias, visual complaints, headache, numbness, weakness or ataxia or problems with walking or coordination,  change in mood or  memory.        Current Meds  Medication Sig   albuterol (VENTOLIN HFA) 108 (90 Base) MCG/ACT inhaler Inhale 2 puffs into the lungs every 6 (six) hours as needed for wheezing or shortness of breath.   atorvastatin (LIPITOR) 20 MG tablet TAKE ONE (1) TABLET EACH DAY   budesonide-formoterol (SYMBICORT) 160-4.5 MCG/ACT inhaler Inhale into the  lungs.   Cholecalciferol (VITAMIN D3) 50 MCG (2000 UT) capsule    estradiol (ESTRACE) 1 MG tablet Take 1 mg by mouth daily.   famotidine (PEPCID) 20 MG tablet One after supper   HYDROcodone-acetaminophen (NORCO) 7.5-325 MG tablet Take 1 tablet by mouth 2 (two) times daily.   pantoprazole (PROTONIX) 40 MG tablet Take 1 tablet (40 mg total) by mouth daily. Take 30-60 min before first meal of the day   RYBELSUS 3 MG TABS Take 1 tablet by mouth every morning.               Past Medical History:  Diagnosis Date   Anemia    Anxiety    Arthritis    Bipolar disorder (HCC)    Depression    Fatigue    Fibromyalgia    Headache    Pneumonia due to COVID-19 virus 07/30/2020   [facility]   PTSD (post-traumatic stress disorder)    Sleep apnea        Objective:     Wt Readings from Last 3 Encounters:  07/28/23 (!) 317 lb 3.2 oz (143.9 kg)  04/27/23 (!) 312 lb 3.2 oz (141.6 kg)  03/18/23 (!) 307 lb (139.3 kg)      Vital Bender reviewed  07/28/2023  - Note at rest 02 sats  94% on RA   General appearance:    amb MO (by BMI) wf nad occ throat clearing   HEENT : Oropharynx  clear/ ears nl      Nasal turbinates mild edema    NECK :  without  apparent JVD/ palpable Nodes/TM    LUNGS: no acc muscle use,  Nl contour chest which is clear to A and P bilaterally without cough on insp or exp maneuvers   CV:  RRR  no s3 or murmur or increase in P2, and no edema   ABD:  obese soft and nontender    MS:  Nl gait/ ext warm without deformities Or obvious joint restrictions  calf tenderness, cyanosis or clubbing    SKIN: warm and dry without lesions    NEURO:  alert, approp, nl sensorium with  no motor or cerebellar deficits apparent.        I personally reviewed images and agree with radiology impression as follows:  CXR:   03/23/23 pa and lateral No evidence of active cardiopulmonary disease.      Assessment

## 2023-07-28 ENCOUNTER — Encounter: Payer: Self-pay | Admitting: Internal Medicine

## 2023-07-28 ENCOUNTER — Ambulatory Visit: Payer: Medicaid Other | Admitting: Internal Medicine

## 2023-07-28 VITALS — BP 123/87 | HR 91 | Ht 63.0 in | Wt 317.2 lb

## 2023-07-28 DIAGNOSIS — R0609 Other forms of dyspnea: Secondary | ICD-10-CM

## 2023-07-28 DIAGNOSIS — R058 Other specified cough: Secondary | ICD-10-CM

## 2023-07-28 NOTE — Patient Instructions (Signed)
Stop protonix and just take pepcid 20 mg after supper/bedtime to see if cough is worse   Try zyrtec 10 mg one daily (24 h) try taking at bedtime   Symbicort 160 is up to 2 puffs every 12 hours  Work on inhaler technique:  relax and gently blow all the way out then take a nice smooth full deep breath back in, triggering the inhaler at same time you start breathing in.  Hold breath in for at least  5 seconds if you can. Blow out symbicort  thru nose. Rinse and gargle with water when done.  If mouth or throat bother you at all,  try brushing teeth/gums/tongue with arm and hammer toothpaste/ make a slurry and gargle and spit out.   See your ENT doctor as soon as possible -  see allergy next if not satisfied   Pulmonary follow up is as needed

## 2023-07-29 NOTE — Assessment & Plan Note (Signed)
Onset with covid 19 07/2022 required 02 for sev weeks p d/c - 04/27/2023   Walked on RA  x  3  lap(s) =  approx 450  ft  @ mod pace, stopped due to end of study with lowest 02 sats 90% with minimal doe that resolved immediately a stopping    Advised on sub max ex/ check sats and peak ex and for now continue symbicort 80 2bid until allergy eval done

## 2023-07-29 NOTE — Assessment & Plan Note (Signed)
Complicated by AODM/ OSA   Body mass index is 56.19 kg/m.  -  trending up again  Lab Results  Component Value Date   TSH 2.200 04/27/2023      Contributing to doe and risk of GERD/ dvt/ pe >>>   reviewed the need and the process to achieve and maintain neg calorie balance > defer f/u primary care including intermittently monitoring thyroid status            Each maintenance medication was reviewed in detail including emphasizing most importantly the difference between maintenance and prns and under what circumstances the prns are to be triggered using an action plan format where appropriate.  Total time for H and P, chart review, counseling, reviewing hfa device(s) and generating customized AVS unique to this office visit / same day charting > 30 min for  refractory respiratory  symptoms of uncertain etiology / summary f/u final regular ov

## 2023-07-29 NOTE — Assessment & Plan Note (Addendum)
Onset ? 2016 much worse since covid 19 07/23/22  - ENT eval 07/23/22 [facility] > neg ear and larynx exam > rec trial of singulair trial > one month no better so pt d/c'd  - CT head /sinus   04/11/23 neg  assoc with vertigo  - Allergy screen 04/27/2023 >  Eos 0.3 /  IgE  123> refer to allergy  prn p ent eval complete  - 07/28/2023 added zyrtec prn and wean off gerd rx if tol   Improved cough on gerd rx but  lots of sinus/ ear complaints with no findings on exam so needs to do ent f/u then refer to allergy next step and here prn   Re PPI  Discussed the recent press about ppi's in the context of a statistically significant (but questionably clinically relevant) increase in CRI in pts on ppi vs h2's > bottom line is the lowest dose of ppi that controls   gerd is the right dose and if that dose is zero that's fine esp since h2's are cheaper.    >>> see AVS re weaning gerd rx with f/u by PCP/ GI prn flare off gerd rx

## 2023-09-01 NOTE — Addendum Note (Signed)
Addended by: Reesa Chew on: 09/01/2023 06:46 PM   Modules accepted: Orders

## 2023-09-05 NOTE — Progress Notes (Unsigned)
GI Office Note    Referring Provider: Richardean Chimera, MD Primary Care Physician:  Richardean Chimera, MD Primary Gastroenterologist: Hennie Duos. Robin Lor, DO  Date:  09/06/2023  ID:  Robin Bender, DOB 1976/02/05, MRN [id number]   Chief Complaint   Chief Complaint  Patient presents with   enlarged liver    Stomach hurts when she eats. Sometimes has diarrhea after she eats. Sometime has constipation    History of Present Illness  Robin Bender is a 47 y.o. female with a history of constipation, anemia, anxiety, bipolar/depression, fibromyalgia, and PTSD presenting today for follow-up of constipation and new referral for hepatomegaly and hepatic steatosis.  Family history of colon polyps in her mother and grandmother. Mother diagnosed in her 23s.  Initial office visit 02/10/23. Issues with constipation since her hysterectomy, reported scar tissue that was removed.  Using Linzess about 3 times per month, not able to take daily due to diarrhea.  Tried MiraLAX in the past without good relief.  Tries to eat fiber rich foods and greens.  Drinks water mostly.  Usually has to strain and has very solid bowel movements.  If having more regular bowel movements she has a better appetite.  Denied any reflux, nausea, or vomiting.  Does have some baseline SOB with exertion.  Was undergoing evaluation for sleep apnea.  Does not usually eat breakfast.  Does have diabetes and was taking metformin, previously tried Victoza but decided not to do daily injections.  Advised to continue Linzess 72 mcg as needed and proceed with colonoscopy.  Colonoscopy 03/18/2023: - Non- bleeding internal hemorrhoids.  - The examination was otherwise normal.  - No specimens collected. - Repeat colonoscopy in 10 years  Visit with PCP on 10/17 for cough, fatigue, and epigastric abdominal pain/nausea.  Blood work obtained as well as abdominal ultrasound ordered.  Advised to continue Protonix and Pepcid as needed.  Recent labs  08/25/2023: A1c 6, AST 20, ALT 21, alk phos 89, T. bili 0.4  Abdominal ultrasound 09/02/2023: -Hepatomegaly, liver 18.9 cm -Increased liver echotexture consistent with steatosis -Study limited due to body habitus. -CBD 3 mm, no biliary dilation or wall thickening  Today: Constipation - Has not taken Linzess in a while. Has been going a little more than usually. Still needing to strain but stools have not been hard.   Sometimes has postprandial abdominal pain and diarrhea.   Had a few days of cramping after eating and then could feel stool moving and have diarrhea. This occurred last week but has not happened since. Did not see and brbpr or melena. Pain felt like stinging cramping pain similar to labor. Only lasted when she ate solid food  but if she had clears she was okay. She thinks she may have had pancreatitis before in the past but reports that was several years ago.   Stopped taking all of her medication since she was sick last week. BG has been controlled and not super elevated. Has only taken her pain medication.   Still has pain sometimes with eating. Does not feel hungry but states her stomach weill start burning and she will try to eat or she will notice her blood sugar drops. Very rare that she feels hungry.    Current Outpatient Medications  Medication Sig Dispense Refill   ACCU-CHEK GUIDE test strip      albuterol (VENTOLIN HFA) 108 (90 Base) MCG/ACT inhaler Inhale 2 puffs into the lungs every 6 (six) hours as needed for wheezing or  shortness of breath.     atorvastatin (LIPITOR) 20 MG tablet TAKE ONE (1) TABLET EACH DAY 60 tablet 10   budesonide-formoterol (SYMBICORT) 160-4.5 MCG/ACT inhaler Inhale into the lungs.     estradiol (ESTRACE) 1 MG tablet Take 1 mg by mouth daily.     famotidine (PEPCID) 20 MG tablet One after supper 30 tablet 11   HYDROcodone-acetaminophen (NORCO) 7.5-325 MG tablet Take 1 tablet by mouth 2 (two) times daily.     buPROPion (WELLBUTRIN XL) 150 MG  24 hr tablet Take 150 mg by mouth every morning. (Patient not taking: Reported on 07/28/2023)     Cholecalciferol (VITAMIN D3) 10 MCG (400 UNIT) CAPS Take 400 Units by mouth daily. (Patient not taking: Reported on 07/28/2023)     Cholecalciferol (VITAMIN D3) 50 MCG (2000 UT) capsule  (Patient not taking: Reported on 09/06/2023)     metFORMIN (GLUCOPHAGE-XR) 500 MG 24 hr tablet Take 500 mg by mouth at bedtime. (Patient not taking: Reported on 07/28/2023)     pantoprazole (PROTONIX) 40 MG tablet Take 1 tablet (40 mg total) by mouth daily. Take 30-60 min before first meal of the day (Patient not taking: Reported on 09/06/2023) 30 tablet 2   RYBELSUS 3 MG TABS Take 1 tablet by mouth every morning. (Patient not taking: Reported on 09/06/2023)     No current facility-administered medications for this visit.    Past Medical History:  Diagnosis Date   Anemia    Anxiety    Arthritis    Bipolar disorder (HCC)    Depression    Fatigue    Fibromyalgia    Headache    Pneumonia due to COVID-19 virus 07/30/2020   [facility]   PTSD (post-traumatic stress disorder)    Sleep apnea     Past Surgical History:  Procedure Laterality Date   ABDOMINAL HYSTERECTOMY     APPENDECTOMY     CARPAL TUNNEL RELEASE Bilateral    CESAREAN SECTION  2002, 2004   COLONOSCOPY WITH PROPOFOL N/A 03/18/2023   Procedure: COLONOSCOPY WITH PROPOFOL;  Surgeon: Lanelle Bal, DO;  Location: [facility];  Service: Endoscopy;  Laterality: N/A;  9:30AM;ASA 3   DE QUERVAIN'S RELEASE Left 07/03/2020   DORSAL COMPARTMENT RELEASE Right 09/01/2020   Procedure: RIGHT 1ST DORSAL COMPARTMENT RELEASE;  Surgeon: Eldred Manges, MD;  Location: [facility];  Service: Orthopedics;  Laterality: Right;   LAPAROSCOPIC APPENDECTOMY N/A 05/19/2014   Procedure: APPENDECTOMY LAPAROSCOPIC;  Surgeon: Dalia Heading, MD;  Location: [facility];  Service: General;  Laterality: N/A;   ovarian cyst removed     UMBILICAL HERNIA REPAIR N/A  05/22/2021   Procedure: HERNIA REPAIR UMBILICAL ADULT W/MESH;  Surgeon: Lucretia Roers, MD;  Location: [facility];  Service: General;  Laterality: N/A;   VULVECTOMY PARTIAL Left 02/04/2021   Procedure: VULVECTOMY PARTIAL;  Surgeon: Lazaro Arms, MD;  Location: [facility];  Service: Gynecology;  Laterality: Left;   WISDOM TOOTH EXTRACTION      Family History  Problem Relation Age of Onset   Cancer Mother        cervical   Hypertension Mother    Heart disease Mother    Arthritis/Rheumatoid Mother    Colon polyps Mother    Colon polyps Maternal Grandmother     Allergies as of 09/06/2023 - Review Complete 09/06/2023  Allergen Reaction Noted   Acetaminophen-codeine Rash 04/11/2023   Codeine Other (See Comments) 01/20/2012   Demerol Hives 01/20/2012   Ampicillin-sulbactam sodium  Rash 05/19/2014    Social History   Socioeconomic History   Marital status: Media planner    Spouse name: Not on file   Number of children: 2   Years of education: Not on file   Highest education level: Not on file  Occupational History   Not on file  Tobacco Use   Smoking status: Former    Current packs/day: 0.00    Average packs/day: 0.3 packs/day for 22.0 years (5.5 ttl pk-yrs)    Types: Cigarettes    Start date: 07/30/1998    Quit date: 07/30/2020    Years since quitting: 3.1    Passive exposure: Past   Smokeless tobacco: Never   Tobacco comments:    reduce the # of cigarettes   Vaping Use   Vaping status: Never Used  Substance and Sexual Activity   Alcohol use: No   Drug use: No   Sexual activity: Not Currently    Partners: Male    Birth control/protection: Surgical    Comment: hyst  Other Topics Concern   Not on file  Social History Narrative   Not on file   Social Determinants of Health   Financial Resource Strain: Not on file  Food Insecurity: Low Risk  (05/13/2023)   Received from [facility]   Hunger Vital Sign    Worried About Running Out of Food in the Last Year: Never  true    Ran Out of Food in the Last Year: Never true  Transportation Needs: Not on file (05/13/2023)  Physical Activity: Not on file  Stress: Not on file  Social Connections: Unknown (03/12/2022)   [facility]   Social Network    Social Network: Not on file     Review of Systems   Gen: Denies fever, chills, anorexia. Denies fatigue, weakness, weight loss.  CV: Denies chest pain, palpitations, syncope, peripheral edema, and claudication. Resp: Denies dyspnea at rest, cough, wheezing, coughing up blood, and pleurisy. GI: See HPI Derm: Denies rash, itching, dry skin Psych: Denies depression, anxiety, memory loss, confusion. No homicidal or suicidal ideation.  Heme: Denies bruising, bleeding, and enlarged lymph nodes.   Physical Exam   BP 131/85 (BP Location: Right Arm, Patient Position: Sitting, Cuff Size: Large)   Pulse 82   Temp 98 F (36.7 C) (Temporal)   Ht 5\' 3"  (1.6 m)   Wt (!) 319 lb 9.6 oz (145 kg)   BMI 56.61 kg/m   General:   Alert and oriented. No distress noted. Pleasant and cooperative.  Head:  Normocephalic and atraumatic. Eyes:  Conjuctiva clear without scleral icterus. Mouth:  Oral mucosa pink and moist. Good dentition. No lesions. Abdomen:  +BS, soft, nontender.  Rounded.  No rebound or guarding.  Hepatomegaly noted via percussion.  Unable to assess for splenomegaly. Rectal: deferred Msk:  Symmetrical without gross deformities. Normal posture. Extremities:  Without edema. Neurologic:  Alert and  oriented x4 Psych:  Alert and cooperative. Normal mood and affect.   Assessment  Robin Bender is a 47 y.o. female with a history of constipation, anemia, anxiety, bipolar/depression, fibromyalgia, and PTSD presenting today for follow-up of constipation and new referral for hepatomegaly and hepatic steatosis.  Constipation: Not well-controlled.  Continuing have to strain although stools have been softer more recently.  Unable to  tolerate Linzess due to significant diarrhea.  Will trial Trulance.  GERD, globus sensation: Usually well-controlled with pantoprazole 40 mg once daily and famotidine 20 mg daily.  Recently stopped  all medications given postprandial abdominal pain and globus sensation has returned.  Advised her to resume PPI and H2 blocker.  Continues to have some mild epigastric cramping.  Postprandial abdominal pain: Primarily epigastric region and RUQ described as cramping.  Had associated diarrhea with this as well.  Symptoms improved with decrease in solid food for those 3 days.  Reduced to clear liquids for the most part.  Recent ultrasound with evidence of fatty liver and without any signs of cholelithiasis, choledocholithiasis, or cholecystitis.  No pancreatic findings on ultrasound.  Query possible mild pancreatitis versus biliary colic.  Pancreatitis is possible given her history of diabetes.  Also possible viral illness.  Advised that if recurrent symptoms we could pursue HIDA scan versus fecal elastase to assess the pancreas and gallbladder further.  Symptoms were 1 week ago and lasted for 3 days and no recurrence since.  Hepatic steatosis/MASLD: Hepatomegaly and hepatic steatosis identified on recent abdominal ultrasound.  Likely metabolic associated steatotic liver disease in the setting of hyperlipidemia, obesity, and diabetes.  Blood sugars have been under good control as last A1c was 6.  Discussed potential progression of hepatic steatosis today and discussed lifestyle measures and management with diet.  Education provided today, see AVS.  Will plan for elastography in 6 months and we will go ahead and check ELF and FibroSure as well as screen for hepatitis C.  Recent liver enzymes within normal limits.  If any evidence of F2/F3 fibrosis she may be a good candidate for Rezdiffra.   PLAN   Fatty liver diet discussed CBC, ELF, FibroSure, Hep C Ab Continue famotidine 20 mg daily and pantoprazole 40 mg  daily.  RUQ [place] with elastography in 6 months.  Trulance 3 mg once daily, samples provided. Prescription if helpful.  Can consider HIDA scan versus fecal elastase if recurrent post prandial pain and diarrhea Follow up in 4 month  Brooke Bonito, MSN, FNP-BC, AGACNP-BC [facility]

## 2023-09-06 ENCOUNTER — Encounter: Payer: Self-pay | Admitting: Gastroenterology

## 2023-09-06 ENCOUNTER — Ambulatory Visit: Payer: Medicaid Other | Admitting: Gastroenterology

## 2023-09-06 VITALS — BP 131/85 | HR 82 | Temp 98.0°F | Ht 63.0 in | Wt 319.6 lb

## 2023-09-06 DIAGNOSIS — R160 Hepatomegaly, not elsewhere classified: Secondary | ICD-10-CM

## 2023-09-06 DIAGNOSIS — K219 Gastro-esophageal reflux disease without esophagitis: Secondary | ICD-10-CM | POA: Diagnosis not present

## 2023-09-06 DIAGNOSIS — K5900 Constipation, unspecified: Secondary | ICD-10-CM | POA: Diagnosis not present

## 2023-09-06 DIAGNOSIS — K760 Fatty (change of) liver, not elsewhere classified: Secondary | ICD-10-CM

## 2023-09-06 DIAGNOSIS — R1011 Right upper quadrant pain: Secondary | ICD-10-CM

## 2023-09-06 DIAGNOSIS — R1013 Epigastric pain: Secondary | ICD-10-CM

## 2023-09-06 DIAGNOSIS — R197 Diarrhea, unspecified: Secondary | ICD-10-CM

## 2023-09-06 NOTE — Patient Instructions (Addendum)
Instructions for fatty liver: Recommend 1-2# weight loss per week until ideal body weight through exercise & diet. Low fat/cholesterol diet.   Avoid sweets, sodas, fruit juices, sweetened beverages like tea, etc. Gradually increase exercise from 15 min daily up to 1 hr per day 5 days/week. Limit alcohol use.   Resume famotidine 20 mg once nightly and pantoprazole 40 mg once daily.   I am providing with some samples of Trulance today.  Please wait a couple days before trying this to ensure any potential virus is out of your system.  Please call me with a progress report in 7 to 10 days after taking Trulance to let me know how you are doing.  I will send a prescription if it is helpful.   If you have recurrent abdominal pain and diarrhea please let me know as we can consider further testing with HIDA scan for your gallbladder or fecal testing to assess for pancreatic insufficiency.  Please have blood work completed at [facility].  We will call you with results once they have been received. Please allow 3-5 business days for review. 2 locations for [facility] in [place]:              1. [address], [place]              2. [address] [place]   We will repeat your ultrasound and assess for liver fibrosis in 6 months.  Will see you for follow-up in about 4 months, sooner if needed.    It was a pleasure to see you today. I want to create trusting relationships with patients. If you receive a survey regarding your visit,  I greatly appreciate you taking time to fill this out on paper or through your MyChart. I value your feedback.  Brooke Bonito, MSN, FNP-BC, AGACNP-BC [facility]

## 2023-09-07 ENCOUNTER — Ambulatory Visit (HOSPITAL_BASED_OUTPATIENT_CLINIC_OR_DEPARTMENT_OTHER): Payer: Medicaid Other | Admitting: Cardiology

## 2023-09-07 ENCOUNTER — Other Ambulatory Visit: Payer: Self-pay | Admitting: Sports Medicine

## 2023-09-07 DIAGNOSIS — S838X9A Sprain of other specified parts of unspecified knee, initial encounter: Secondary | ICD-10-CM

## 2023-09-07 LAB — COMPREHENSIVE METABOLIC PANEL
ALT: 19 [IU]/L (ref 0–32)
AST: 19 [IU]/L (ref 0–40)
Albumin: 4.1 g/dL (ref 3.9–4.9)
Alkaline Phosphatase: 95 [IU]/L (ref 44–121)
BUN/Creatinine Ratio: 19 (ref 9–23)
BUN: 10 mg/dL (ref 6–24)
Bilirubin Total: 0.3 mg/dL (ref 0.0–1.2)
CO2: 25 mmol/L (ref 20–29)
Calcium: 9.3 mg/dL (ref 8.7–10.2)
Chloride: 104 mmol/L (ref 96–106)
Creatinine, Ser: 0.54 mg/dL — ABNORMAL LOW (ref 0.57–1.00)
Globulin, Total: 2.4 g/dL (ref 1.5–4.5)
Glucose: 78 mg/dL (ref 70–99)
Potassium: 4.2 mmol/L (ref 3.5–5.2)
Sodium: 142 mmol/L (ref 134–144)
Total Protein: 6.5 g/dL (ref 6.0–8.5)
eGFR: 115 mL/min/{1.73_m2} (ref >59)

## 2023-09-07 LAB — CBC
Hematocrit: 43.9 % (ref 34.0–46.6)
Hemoglobin: 14.1 g/dL (ref 11.1–15.9)
MCH: 28.6 pg (ref 26.6–33.0)
MCHC: 32.1 g/dL (ref 31.5–35.7)
MCV: 89 fL (ref 79–97)
Platelets: 315 10*3/uL (ref 150–450)
RBC: 4.93 x10E6/uL (ref 3.77–5.28)
RDW: 12.9 % (ref 11.7–15.4)
WBC: 9.9 10*3/uL (ref 3.4–10.8)

## 2023-09-08 LAB — NASH FIBROSURE(R) PLUS
ALPHA 2-MACROGLOBULINS, QN: 176 mg/dL (ref 110–276)
ALT (SGPT) P5P: 23 [IU]/L (ref 0–40)
AST (SGOT) P5P: 23 IU/L (ref 0–40)
Apolipoprotein A-1: 165 mg/dL (ref 116–209)
Bilirubin, Total: 0.2 mg/dL (ref 0.0–1.2)
Cholesterol, Total: 206 mg/dL — ABNORMAL HIGH (ref 100–199)
Fibrosis Score: 0.03 (ref 0.00–0.21)
GGT: 35 IU/L (ref 0–60)
Glucose: 82 mg/dL (ref 70–99)
Haptoglobin: 346 mg/dL — ABNORMAL HIGH (ref 42–296)
NASH Score: 0.31 — ABNORMAL HIGH (ref 0.00–0.25)
Steatosis Score: 0.49 — ABNORMAL HIGH (ref 0.00–0.40)
Triglycerides: 225 mg/dL — ABNORMAL HIGH (ref 0–149)

## 2023-09-08 LAB — ENHANCED LIVER FIBROSIS (ELF): ELF(TM) Score: 9.2 (ref <9.80)

## 2023-09-08 LAB — HEPATITIS C ANTIBODY: Hep C Virus Ab: NONREACTIVE

## 2023-09-23 ENCOUNTER — Encounter (HOSPITAL_BASED_OUTPATIENT_CLINIC_OR_DEPARTMENT_OTHER): Payer: Medicaid Other | Admitting: Cardiology

## 2023-09-29 ENCOUNTER — Other Ambulatory Visit: Payer: Medicaid Other

## 2023-11-17 ENCOUNTER — Other Ambulatory Visit: Payer: Medicaid Other

## 2023-11-28 ENCOUNTER — Encounter: Payer: Self-pay | Admitting: *Deleted

## 2023-11-28 ENCOUNTER — Other Ambulatory Visit: Payer: Self-pay | Admitting: Internal Medicine

## 2023-11-28 DIAGNOSIS — R058 Other specified cough: Secondary | ICD-10-CM

## 2023-12-13 ENCOUNTER — Telehealth: Payer: Self-pay | Admitting: Cardiology

## 2023-12-13 NOTE — Telephone Encounter (Signed)
Patient had bipap titration scheduled and cancelled several times last year. She is wanting to try to do the bipap titration again but authorization has ran out. Please advise.

## 2023-12-19 NOTE — Telephone Encounter (Signed)
**Note De-Identified Maili Shutters Obfuscation** I did a BIPAP Titration through the [facility] Provider Portal. Status: APPROVED Authorization #: [id number] Requested Dates: Jan 30, 2024 - Apr 29, 2024  I have forwarded the pts BIPAP Titration order to the sleep lab with a message attached advising them "Per Dr Micael Adas: with patient using an under-the-nose full face mask".

## 2024-01-03 ENCOUNTER — Ambulatory Visit: Payer: Medicaid Other | Admitting: Gastroenterology

## 2024-01-04 ENCOUNTER — Ambulatory Visit: Payer: Medicaid Other | Admitting: Gastroenterology

## 2024-01-10 NOTE — Progress Notes (Unsigned)
 GI Office Note    Referring Provider: Richardean Chimera, MD Primary Care Physician:  Richardean Chimera, MD Primary Gastroenterologist: Hennie Duos. Marletta Lor, DO  Date:  01/11/2024  ID:  Robin Bender, DOB 09/17/1976, MRN [id number]   Chief Complaint   Chief Complaint  Patient presents with   Follow-up    Patient here today for a follow up on her fatty liver. Patient denies any current gi issues.    History of Present Illness  OLIVIANNA Bender is a 48 y.o. female with a history of constipation, anemia, bipolar, anxiety/depression, fibromyalgia, and PTSD presenting today to follow-up on fatty liver as well as constipation.  Family history of colon polyps in her mother and grandmother. Mother diagnosed in her 48s.   Initial office visit 02/10/23. Issues with constipation since her hysterectomy, reported scar tissue that was removed.  Using Linzess about 3 times per month, not able to take daily due to diarrhea.  Tried MiraLAX in the past without good relief.  Tries to eat fiber rich foods and greens.  Drinks water mostly.  Usually has to strain and has very solid bowel movements.  If having more regular bowel movements she has a better appetite.  Denied any reflux, nausea, or vomiting.  Does have some baseline SOB with exertion.  Was undergoing evaluation for sleep apnea.  Does not usually eat breakfast.  Does have diabetes and was taking metformin, previously tried Victoza but decided not to do daily injections.  Advised to continue Linzess 72 mcg as needed and proceed with colonoscopy.   Colonoscopy 03/18/2023: - Non- bleeding internal hemorrhoids.  - The examination was otherwise normal.  - No specimens collected. - Repeat colonoscopy in 10 years   Visit with PCP on 10/17 for cough, fatigue, and epigastric abdominal pain/nausea.  Blood work obtained as well as abdominal ultrasound ordered.  Advised to continue Protonix and Pepcid as needed.   Labs 08/25/2023: A1c 6, AST 20, ALT 21, alk phos 89,  T. bili 0.4   Abdominal ultrasound 09/02/2023: -Hepatomegaly, liver 18.9 cm -Increased liver echotexture consistent with steatosis -Study limited due to body habitus. -CBD 3 mm, no biliary dilation or wall thickening  Last office visit 09/06/23.  Occasional postprandial abdominal pain and diarrhea, this only happened a few days and then resolved.  Blood glucose had been controlled.  Had been going Levit more frequent than usual, not taking Linzess.  Still need to strain at times but stools not hard. Fatty liver diet discussed.  Labs occluding CBC, LFT, FibroSure, hep C antibody ordered.  Advised to continue pantoprazole 40 mg once daily and famotidine 20 mg daily.  RUQ [place] with elastography in 6 months.  Provided samples of Trulance.  Consider HIDA scan versus fecal elastase if recurrent postprandial pain and diarrhea.  Labs 09/06/2023: LFTs within normal limits.  No fibrosis on FibroSure, mild steatosis of mild NASH.  ELF 9.2.  Hep C negative.  Advised weight loss and avoidance of processed foods and sweets.  Today:  Trying to work toward weight loss. Not going back for seconds and making better choices and eating fruit instead of sweet cakes. Is getting an appointment with [facility] [facility].   Going to the gym once per week.  Has been out of her medicine, the other night had some indigestion but things some of her issue but thinks it may be related to her chronic bronchitis. Does have a family history of cystic fibrosis. Has been having the  bronchitis issue since she had bronchitis. Does have feeling   Has not been having any trouble with constipation. Eating more fiber though and has been doing well without any laxatives or prescriptions.   Has tried rybelsus and victoza.    Wt Readings from Last 3 Encounters:  01/11/24 (!) 319 lb 3.2 oz (144.8 kg)  09/06/23 (!) 319 lb 9.6 oz (145 kg)  07/28/23 (!) 317 lb 3.2 oz (143.9 kg)    Current Outpatient Medications   Medication Sig Dispense Refill   ACCU-CHEK GUIDE test strip      albuterol (VENTOLIN HFA) 108 (90 Base) MCG/ACT inhaler Inhale 2 puffs into the lungs every 6 (six) hours as needed for wheezing or shortness of breath.     amphetamine-dextroamphetamine (ADDERALL) 20 MG tablet Take 20 mg by mouth daily. Takes when she remembers to take it.     atorvastatin (LIPITOR) 20 MG tablet TAKE ONE (1) TABLET EACH DAY 60 tablet 10   budesonide-formoterol (SYMBICORT) 160-4.5 MCG/ACT inhaler Inhale into the lungs.     Cholecalciferol (VITAMIN D3) 10 MCG (400 UNIT) CAPS Take 400 Units by mouth daily.     estradiol (ESTRACE) 1 MG tablet Take 1 mg by mouth daily.     metFORMIN (GLUCOPHAGE-XR) 500 MG 24 hr tablet Take 500 mg by mouth at bedtime.     oxyCODONE-acetaminophen (PERCOCET) 10-325 MG tablet Take 1 tablet by mouth every 4 (four) hours as needed for pain.     famotidine (PEPCID) 20 MG tablet One after supper (Patient not taking: Reported on 01/11/2024) 30 tablet 11   pantoprazole (PROTONIX) 40 MG tablet Take 1 tablet (40 mg total) by mouth daily. Take 30-60 min before first meal of the day (Patient not taking: Reported on 01/11/2024) 30 tablet 2   No current facility-administered medications for this visit.    Past Medical History:  Diagnosis Date   Anemia    Anxiety    Arthritis    Bipolar disorder (HCC)    Depression    Fatigue    Fibromyalgia    Headache    Pneumonia due to COVID-19 virus 07/30/2020   [facility]   PTSD (post-traumatic stress disorder)    Sleep apnea     Past Surgical History:  Procedure Laterality Date   ABDOMINAL HYSTERECTOMY     APPENDECTOMY     CARPAL TUNNEL RELEASE Bilateral    CESAREAN SECTION  2002, 2004   COLONOSCOPY WITH PROPOFOL N/A 03/18/2023   Procedure: COLONOSCOPY WITH PROPOFOL;  Surgeon: Lanelle Bal, DO;  [facility];  Service: Endoscopy;  Laterality: N/A;  9:30AM;ASA 3   DE QUERVAIN'S RELEASE Left 07/03/2020   DORSAL COMPARTMENT  RELEASE Right 09/01/2020   Procedure: RIGHT 1ST DORSAL COMPARTMENT RELEASE;  Surgeon: Eldred Manges, MD;  Location: [facility];  Service: Orthopedics;  Laterality: Right;   LAPAROSCOPIC APPENDECTOMY N/A 05/19/2014   Procedure: APPENDECTOMY LAPAROSCOPIC;  Surgeon: Dalia Heading, MD;  Location: [facility];  Service: General;  Laterality: N/A;   ovarian cyst removed     UMBILICAL HERNIA REPAIR N/A 05/22/2021   Procedure: HERNIA REPAIR UMBILICAL ADULT W/MESH;  Surgeon: Lucretia Roers, MD;  Location: [facility];  Service: General;  Laterality: N/A;   VULVECTOMY PARTIAL Left 02/04/2021   Procedure: VULVECTOMY PARTIAL;  Surgeon: Lazaro Arms, MD;  Location: [facility];  Service: Gynecology;  Laterality: Left;   WISDOM TOOTH EXTRACTION      Family History  Problem Relation Age of Onset  Cancer Mother        cervical   Hypertension Mother    Heart disease Mother    Arthritis/Rheumatoid Mother    Colon polyps Mother    Colon polyps Maternal Grandmother     Allergies as of 01/11/2024 - Review Complete 01/11/2024  Allergen Reaction Noted   Acetaminophen-codeine Rash 04/11/2023   Codeine Other (See Comments) 01/20/2012   Demerol Hives 01/20/2012   Ampicillin-sulbactam sodium Rash 05/19/2014    Social History   Socioeconomic History   Marital status: Media planner    Spouse name: Not on file   Number of children: 2   Years of education: Not on file   Highest education level: Not on file  Occupational History   Not on file  Tobacco Use   Smoking status: Former    Current packs/day: 0.00    Average packs/day: 0.3 packs/day for 22.0 years (5.5 ttl pk-yrs)    Types: Cigarettes    Start date: 07/30/1998    Quit date: 07/30/2020    Years since quitting: 3.4    Passive exposure: Past   Smokeless tobacco: Never   Tobacco comments:    reduce the # of cigarettes   Vaping Use   Vaping status: Never Used  Substance and Sexual Activity   Alcohol use: No   Drug use: No    Sexual activity: Not Currently    Partners: Male    Birth control/protection: Surgical    Comment: hyst  Other Topics Concern   Not on file  Social History Narrative   Not on file   Social Drivers of Health   Financial Resource Strain: Not on file  Food Insecurity: Low Risk  (05/13/2023)   Received from [facility]   Hunger Vital Sign    Worried About Running Out of Food in the Last Year: Never true    Ran Out of Food in the Last Year: Never true  Transportation Needs: Not on file (05/13/2023)  Physical Activity: Not on file  Stress: Not on file  Social Connections: Unknown (03/12/2022)   Received from [facility]   Social Network    Social Network: Not on file     Review of Systems   Gen: Denies fever, chills, anorexia. Denies fatigue, weakness, weight loss.  CV: Denies chest pain, palpitations, syncope, peripheral edema, and claudication. Resp: Denies dyspnea at rest, cough, wheezing, coughing up blood, and pleurisy. GI: See HPI Derm: Denies rash, itching, dry skin Psych: Denies depression, anxiety, memory loss, confusion. No homicidal or suicidal ideation.  Heme: Denies bruising, bleeding, and enlarged lymph nodes.  Physical Exam   BP 124/83 (BP Location: Left Arm, Patient Position: Sitting, Cuff Size: Large)   Pulse 94   Temp (!) 97.2 F (36.2 C) (Temporal)   Ht 5\' 4"  (1.626 m)   Wt (!) 319 lb 3.2 oz (144.8 kg)   BMI 54.79 kg/m   General:   Alert and oriented. No distress noted. Pleasant and cooperative.  Head:  Normocephalic and atraumatic. Eyes:  Conjuctiva clear without scleral icterus. Mouth:  Oral mucosa pink and moist. Good dentition. No lesions. Abdomen:  +BS, soft, non-tender and non-distended. No rebound or guarding. No HSM or masses noted. Rectal: deferred Msk:  Symmetrical without gross deformities. Normal posture. Extremities:  Without edema. Neurologic:  Alert and  oriented x4 Psych:  Alert and cooperative. Normal mood and  affect.  Assessment  Robin Bender is a 48 y.o. female with a history of constipation, anemia, bipolar,  anxiety/depression, fibromyalgia, and PTSD presenting today to follow-up on fatty liver as well as constipation.  MASLD: Previous hepatic steatosis and hepatomegaly on ultrasound.  Hepatomegaly likely in the setting of metabolic disease given hyperlipidemia, obesity, and diabetes.  Has had good glucose control as of late.  No evidence of fibrosis based on else and FibroSure.  Liver enzymes have been within normal limits previously.  Recommended Mediterranean diet today and discussed ongoing weight loss efforts, she will try to participate with healthy weight and wellness.  GERD, globus sensation: Facet globus sensation is more so related to her chronic bronchitis.  GERD usually controlled with pantoprazole and famotidine, ran out of medication yesterday.  Will refill today.  Denies any nausea, vomiting, overt dysphagia, or epigastric abdominal pain.  Constipation: Fairly well-controlled with change in diet.  Has not needed any Linzess and never tried Trulance given bowel movements and frequency improved without medication.  PLAN   Mediterranean diet Continue to work toward weight loss, follow with healthy weight and wellness.  RUQ ultrasound with elastography Pantoprazole 40 mg once daily. Refilled today.  Famotidine 20 mg once daily Follow-up 6 months    Brooke Bonito, MSN, FNP-BC, AGACNP-BC [facility]

## 2024-01-11 ENCOUNTER — Ambulatory Visit: Payer: Medicaid Other | Admitting: Gastroenterology

## 2024-01-11 ENCOUNTER — Telehealth: Payer: Self-pay | Admitting: *Deleted

## 2024-01-11 ENCOUNTER — Encounter: Payer: Self-pay | Admitting: Gastroenterology

## 2024-01-11 VITALS — BP 124/83 | HR 94 | Temp 97.2°F | Ht 64.0 in | Wt 319.2 lb

## 2024-01-11 DIAGNOSIS — R160 Hepatomegaly, not elsewhere classified: Secondary | ICD-10-CM

## 2024-01-11 DIAGNOSIS — R058 Other specified cough: Secondary | ICD-10-CM

## 2024-01-11 DIAGNOSIS — R09A2 Foreign body sensation, throat: Secondary | ICD-10-CM

## 2024-01-11 DIAGNOSIS — K219 Gastro-esophageal reflux disease without esophagitis: Secondary | ICD-10-CM | POA: Diagnosis not present

## 2024-01-11 DIAGNOSIS — K760 Fatty (change of) liver, not elsewhere classified: Secondary | ICD-10-CM

## 2024-01-11 MED ORDER — FAMOTIDINE 20 MG PO TABS: ORAL_TABLET | ORAL | 11 refills | Status: DC

## 2024-01-11 MED ORDER — PANTOPRAZOLE SODIUM 40 MG PO TBEC: 40.0000 mg | DELAYED_RELEASE_TABLET | Freq: Every day | ORAL | 2 refills | Status: AC

## 2024-01-11 NOTE — Telephone Encounter (Signed)
 Pt informed that [place] is schedule for Monday 01/23/24, arrive at 9:15 am to check in and NPO after midnight

## 2024-01-11 NOTE — Patient Instructions (Addendum)
 We are scheduling you for a right upper quadrant ultrasound in the near future.  Below is information about Mediterranean diet.  Continue pantoprazole 40 mg once daily as well as your famotidine.  I have refilled both of these to the pharmacy for you today.  I continue to recommend healthy weight and wellness, they are a great resource if you can participate in this program.  If for any reason you need a new referral, please feel free to reach out.  Continue to exercise as much as tolerable.  It was great to see you again today!  Follow-up in 6 months.  If for any reason you do not follow-up with healthy weight and wellness within the next several months, please let me know and we can recheck liver enzymes.  It was a pleasure to see you today. I want to create trusting relationships with patients. If you receive a survey regarding your visit,  I greatly appreciate you taking time to fill this out on paper or through your MyChart. I value your feedback.  Brooke Bonito, MSN, FNP-BC, AGACNP-BC [facility]  What is the Mediterranean Diet? The Mediterranean Diet is a way of eating that emphasizes plant-based foods and healthy fats. You focus on overall eating patterns rather than following strict formulas or calculations.  In general, you'll eat: Lots of vegetables, fruit, beans, lentils and nuts. A good amount of whole grains, like whole-wheat bread and brown rice. Plenty of extra virgin olive oil (EVOO) as a source of healthy fat. A good amount of fish, especially fish rich in omega-3 fatty acids. A moderate amount of natural cheese and yogurt. Little or no red meat, choosing poultry, fish or beans instead of red meat. Little or no sweets, sugary drinks or butter. A moderate amount of wine with meals (but if you don't already drink, don't start). This is how people ate in certain Mediterranean countries in the mid-20th century. Researchers have linked these  eating patterns with a reduced risk of coronary artery disease (CAD). Today, healthcare providers recommend this eating plan if you have risk factors for heart disease or to support other aspects of your health.  A dietitian can help you modify your approach as needed based on your medical history, underlying conditions, allergies and preferences.    What are the benefits of the Mediterranean Diet? The mediterranean diet allows you to focus on overall eating patterns rather than following strict formulas or calculations.  The Mediterranean Diet has many benefits, including: Lowering your risk of cardiovascular disease, including a heart attack or stroke. Supporting a body weight that's healthy for you. Supporting healthy blood sugar levels, blood pressure and cholesterol. Lowering your risk of metabolic syndrome. Supporting a healthy balance of gut microbiota (bacteria and other microorganisms) in your digestive system. Lowering your risk for certain types of cancer. Slowing the decline of brain function as you age. Helping you live longer.  The Mediterranean Diet has these benefits because it: Limits saturated fat and trans fat. You need some saturated fat, but only in small amounts. Eating too much saturated fat can raise your LDL (bad) cholesterol. A high LDL raises your risk of plaque buildup in your arteries (atherosclerosis). Trans fat has no health benefits. Both of these "unhealthy fats" can cause inflammation. Encourages healthy unsaturated fats, including omega-3 fatty acids. Unsaturated fats promote healthy cholesterol levels, support brain health and combat inflammation. Plus, a diet high in unsaturated fats and low in saturated fat promotes healthy blood sugar levels. Limits  sodium. Eating foods high in sodium can raise your blood pressure, putting you at a greater risk for a heart attack or stroke. Limits refined carbohydrates, including sugar. Foods high in refined carbs can  cause your blood sugar to spike. Refined carbs also give you excess calories without much nutritional benefit. For example, such foods often have little or no fiber. Favors foods high in fiber and antioxidants. These nutrients help reduce inflammation throughout your body. Fiber also helps keep waste moving through your large intestine and helps maintain healthy blood sugar levels. Antioxidants protect you against cancer by warding off free radicals. The Mediterranean Diet includes many different nutrients that work together to help your body. There's no single food or ingredient responsible for the Mediterranean Diet's benefits. Instead, the diet is healthy for you because of the combination of nutrients it provides.  Think of a choir with many people singing. One voice alone might carry part of the tune, but you need all the voices to come together to achieve the full effect. Similarly, the Mediterranean Diet works by giving you an ideal blend of nutrients that harmonize to support your health.  Mediterranean Diet food list The Mediterranean Diet encourages you to eat plenty of some foods (like whole grains and vegetables) while limiting others. If you're planning a grocery store trip, you might wonder which foods to buy. Here are some examples of foods to eat often with the Mediterranean Diet.  A Mediterranean Diet food list includes a mix of veggies, tubers, fruits, grains, nuts, seeds and legumes.    Mediterranean Diet serving goals and sizes A fridge and pantry full of nutritious foods are great for starters. But where do you go from there? How much of each food do you need? It's always best to talk to a dietitian to get advice tailored to your needs as you get started. The chart below offers some general guidance on serving goals and serving sizes, according to the type of food.    How do I create a Mediterranean Diet meal plan? It's important to consult with a primary care physician (PCP)  or dietitian before making drastic changes to your diet or trying any new eating plan. They'll make sure your intended plan is best for you based on your individual needs. They may also share meal plans and recipes for you to try at home.  In general, when thinking about meals, you'll want to collect some go-to options and recipes for breakfasts, lunches, dinners and snacks. The more variety, the better. You don't want to get stuck in a rut or feel like you're restricted in which foods you can or should eat. Luckily, there's plenty of room for changing things up with the Mediterranean Diet.    What foods are not allowed on the Mediterranean Diet? The Mediterranean Diet doesn't set hard and fast rules for what you're allowed or not allowed. Rather, it encourages you to eat more of certain foods and limit others. Here's what you should try to limit as much as possible: Any foods with added sugar, like bakery goods, ice cream and even some granola bars. Any drinks with added sugar, including fruit juices and sodas. Beer and liquor. Foods high in sodium or saturated fat. Refined carbohydrates, like white bread and white rice. Highly processed foods, like some cheeses. Fatty or processed meats. Additional Common Questions What is the Mediterranean Diet pyramid? The Mediterranean Diet pyramid is one way to visualize what foods you should eat and how often. Different  organizations have created slightly different versions of this pyramid. All Mediterranean Diet pyramids encourage you to eat mostly veggies, fruits, whole grains and extra virgin olive oil while limiting red meat and sweets.  How does lifestyle relate to the Mediterranean Diet?  To get the most from your eating plan, try to: Exercise regularly, ideally with others. Avoid smoking or using any tobacco products. Prepare and enjoy meals with family and friends. Adriana Simas more often than you eat out. Eat locally sourced foods whenever  possible.  Can the Mediterranean Diet be vegetarian? Yes. If you prefer a vegetarian diet, you can easily modify the Mediterranean Diet to exclude meat and fish. In that case, you'd gain your protein solely from plant sources like nuts and beans. Talk to a dietitian to learn more.  Can the Mediterranean Diet be gluten-free? Yes. You can modify recipes to exclude gluten-based products. Talk to a dietitian for recipe ideas and support in making necessary changes.  Can I use regular olive oil instead of extra virgin olive oil? Regular olive oil is a good alternative to an oil that's high in saturated fat (like palm oil). However, to get the most benefits, opt for extra virgin olive oil.  A crucial fact to know before starting the Mediterranean Diet is that not all olive oils are the same. The Mediterranean Diet calls for extra virgin olive oil (EVOO), specifically. That's because it has a healthy fat ratio. This means EVOO contains more healthy fat (unsaturated) than unhealthy fat (saturated). Aside from its fat ratio, EVOO is healthy because it's high in antioxidants.  Antioxidants help protect your cells from damage, therefore protecting your heart and brain and reducing inflammation throughout your body. Because it's manufactured differently, regular olive oil doesn't contain as many of these antioxidants.  A note from [facility]: In a world with endless diet options, it can be hard to know which one is right for you. Research has proven the benefits of the Mediterranean Diet for many people, especially those at risk for heart disease. Beyond protecting your heart, the Mediterranean Diet can help you prevent or manage many other conditions.

## 2024-01-12 DIAGNOSIS — Z0289 Encounter for other administrative examinations: Secondary | ICD-10-CM

## 2024-01-16 ENCOUNTER — Encounter: Payer: Self-pay | Admitting: Family Medicine

## 2024-01-16 ENCOUNTER — Ambulatory Visit: Admitting: Family Medicine

## 2024-01-16 VITALS — BP 118/81 | HR 85 | Temp 98.5°F | Ht 64.0 in | Wt 314.0 lb

## 2024-01-16 DIAGNOSIS — E785 Hyperlipidemia, unspecified: Secondary | ICD-10-CM

## 2024-01-16 DIAGNOSIS — E119 Type 2 diabetes mellitus without complications: Secondary | ICD-10-CM | POA: Diagnosis not present

## 2024-01-16 DIAGNOSIS — M25561 Pain in right knee: Secondary | ICD-10-CM

## 2024-01-16 DIAGNOSIS — F331 Major depressive disorder, recurrent, moderate: Secondary | ICD-10-CM | POA: Diagnosis not present

## 2024-01-16 DIAGNOSIS — M222X2 Patellofemoral disorders, left knee: Secondary | ICD-10-CM

## 2024-01-16 DIAGNOSIS — G4733 Obstructive sleep apnea (adult) (pediatric): Secondary | ICD-10-CM

## 2024-01-16 DIAGNOSIS — K760 Fatty (change of) liver, not elsewhere classified: Secondary | ICD-10-CM | POA: Insufficient documentation

## 2024-01-16 DIAGNOSIS — M51369 Other intervertebral disc degeneration, lumbar region without mention of lumbar back pain or lower extremity pain: Secondary | ICD-10-CM

## 2024-01-16 DIAGNOSIS — M222X1 Patellofemoral disorders, right knee: Secondary | ICD-10-CM | POA: Diagnosis not present

## 2024-01-16 DIAGNOSIS — R0602 Shortness of breath: Secondary | ICD-10-CM

## 2024-01-16 DIAGNOSIS — Z6841 Body Mass Index (BMI) 40.0 and over, adult: Secondary | ICD-10-CM

## 2024-01-16 DIAGNOSIS — E782 Mixed hyperlipidemia: Secondary | ICD-10-CM

## 2024-01-16 DIAGNOSIS — R5383 Other fatigue: Secondary | ICD-10-CM | POA: Diagnosis not present

## 2024-01-16 DIAGNOSIS — Z7984 Long term (current) use of oral hypoglycemic drugs: Secondary | ICD-10-CM

## 2024-01-16 DIAGNOSIS — E66813 Obesity, class 3: Secondary | ICD-10-CM

## 2024-01-16 NOTE — Progress Notes (Signed)
 At a Glance:  Vitals Temp: 98.5 F (36.9 C) BP: 118/81 Pulse Rate: 85 SpO2: 91 %   Anthropometric Measurements Height: 5\' 4"  (1.626 m) Weight: (!) 314 lb (142.4 kg) BMI (Calculated): 53.87 Starting Weight: 314lb   Body Composition  Body Fat %: 54.7 % Fat Mass (lbs): 172 lbs Muscle Mass (lbs): 135.2 lbs Total Body Water (lbs): 101 lbs Visceral Fat Rating : 21   Other Clinical Data Fasting: Yes Labs: Yes Today's Visit #: 1 Starting Date: 01/16/24    EKG: Normal sinus rhythm, rate 85.  Indirect Calorimeter completed today shows a VO2 of 361 and a REE of 2491.  Her calculated basal metabolic rate is 1610 thus her basal metabolic rate is better than expected.  Chief Complaint:  Obesity   Subjective:  Robin Bender (MR# [id number]) is a 48 y.o. female who presents for evaluation and treatment of obesity and related comorbidities.   Robin Bender is currently in the action stage of change and ready to dedicate time achieving and maintaining a healthier weight. Robin Bender is interested in becoming our patient and working on intensive lifestyle modifications including (but not limited to) diet and exercise for weight loss.  Robin Bender has been struggling with her weight. She has been unsuccessful in either losing weight, maintaining weight loss, or reaching her healthy weight goal.  She has a lifelong struggle of obesity.  She lives with her fianc, her 25 year old son and her 2 stepsons ages 17 and 5.  She has poor support to eat healthy at home.  She has started exercising once a week but is limited by chronic pain due to lumbar disc disease and fibromyalgia.  She has gained weight since her hysterectomy in 2009 with oophorectomy for endometriosis and a noncancerous tumor.  She remains on estradiol.  She takes oxycodone up to 3 times daily for chronic pain.  She is on Adderall for attention deficit disorder.  She is awaiting BiPAP titration for OSA, currently not using her CPAP machine.   She had tried Victoza and Rybelsus for type 2 diabetes and did well but got tired of injecting daily with Victoza and did not see much weight loss on Rybelsus.  Robin Bender's habits were reviewed today and are as follows: she struggles with family and or coworkers weight loss sabotage, she has been heavy most of her life, she is a picky eater and doesn't like to eat healthier foods, she has significant food cravings issues, she snacks frequently in the evenings, she frequently makes poor food choices, she has problems with excessive hunger, and she struggles with emotional eating.   Other Fatigue Robin Bender admits to daytime somnolence and admits to waking up still tired. Patient has a history of symptoms of daytime fatigue. Robin Bender generally gets 5 or 6 hours of sleep per night, and states that she has nightime awakenings. Snoring is present. Apneic episodes are present. Epworth Sleepiness Score is 17. She has OSA and has a CPAP but feels like pressure is too high. Awaiting Bipap titration with Dr Mayford Knife  Shortness of Breath Makinsley notes increasing shortness of breath with exercising and seems to be worsening over time with weight gain. She notes getting out of breath sooner with activity than she used to. This has gotten worse recently. Robin Bender denies shortness of breath at rest or orthopnea.   Depression Screen Robin Bender's Food and Mood (modified PHQ-9) score was 20.     01/06/2017    1:33 PM  Depression screen PHQ 2/9  Decreased Interest  Down, Depressed, Hopeless   PHQ - 2 Score   Altered sleeping   Tired, decreased energy   Change in appetite   Feeling bad or failure about yourself    Trouble concentrating   Moving slowly or fidgety/restless   Suicidal thoughts   PHQ-9 Score   Difficult doing work/chores      Information is confidential and restricted. Go to Review Flowsheets to unlock data.     Assessment and Plan:   Other Fatigue Angalena does feel that her weight is causing her energy  to be lower than it should be. Fatigue may be related to obesity, depression or many other causes. Labs will be ordered, and in the meanwhile, Robin Bender will focus on self care including making healthy food choices, increasing physical activity and focusing on stress reduction.  Shortness of Breath Robin Bender does feel that she gets out of breath more easily that she used to when she exercises. Robin Bender's shortness of breath appears to be obesity related and exercise induced. She has agreed to work on weight loss and gradually increase exercise to treat her exercise induced shortness of breath. Will continue to monitor closely.  Robin Bender had a positive depression screening. Depression is commonly associated with obesity and often results in emotional eating behaviors. We will monitor this closely and work on CBT to help improve the non-hunger eating patterns. Referral to Psychology may be required if no improvement is seen as she continues in our clinic.    Problem List Items Addressed This Visit     PATELLO-FEMORAL SYNDROME Complains of bilateral knee pain which limits her ability to walk for exercise. Begin active plan for weight reduction and consider referral to PT    Major depressive disorder, recurrent episode, moderate (HCC) Bariatric PHQ-9 score elevated at 20 She is currently not on any mood medications. She has a long history of emotional eating. She has a good support system at home and is here with her mom today. Follow-up with PCP for medication management.   OSA on CPAP She is awaiting BiPAP titration with Dr. Mayford Knife.  Recommend calling for follow-up appointment.  Begin active plan for weight reduction and work on improving sleep hygiene   Degenerative lumbar disc She is on oxycodone 10/325 1 tab every 4 hours as needed pain.  She notes side effects of nausea. Begin active plan for weight reduction.  Consider referral to PT or physiatry.   Hyperlipidemia Lab Results  Component Value  Date   CHOL 206 (H) 09/06/2023   HDL 62 01/25/2023   LDLCALC 85 01/25/2023   TRIG 225 (H) 09/06/2023   CHOLHDL 2.8 01/25/2023  She is doing well on atorvastatin 20 mg once daily.  Anticipate improvements in triglyceride levels with improving dietary choices on her prescribed food plan.    Type 2 diabetes mellitus without complication, without long-term current use of insulin (HCC) Lab Results  Component Value Date   HGBA1C 5.8 (H) 05/20/2021   She is currently on metformin XR 500 mg daily. She notes previous use of Victoza and Rybelsus.  She dislikes the daily injections with Victoza and did not see much weight reduction on Rybelsus.  Her diet is currently high in added sugar and refined carbohydrates, lacking regular exercise.  Reconsider use of a once weekly GLP-1 receptor agonist for both obesity and type 2 diabetes.  Begin prescribed dietary plan.   Metabolic dysfunction-associated steatotic liver disease (MASLD) GI notes reviewed from Brooke Bonito, NP.  01/11/2024.  An ultrasound with elastography has  been placed.  She has findings of hepatomegaly and hepatic steatosis on the right upper quadrant ultrasound dated 06/02/2023.  She had no findings of fibrosis on a [facility]ro[facility] scan.  Look for improvement in fatty liver with weight reduction.  Begin prescribed dietary plan, regular exercise and weight loss.   Other Visit Diagnoses       SOBOE (shortness of breath on exertion)    -  Primary     Other fatigue       Relevant Orders   EKG 12-Lead (Completed)   VITAMIN D 25 Hydroxy (Vit-D Deficiency, Fractures)   TSH   T4, free   T3   Lipid panel   Insulin, random   Hemoglobin A1c   Folate   Comprehensive metabolic panel   Vitamin B12   CBC with Differential/Platelet     Class 3 severe obesity due to excess calories with serious comorbidity and body mass index (BMI) of 50.0 to 59.9 in adult [facility])           Marri is currently in the action stage of change and her goal is to  continue with weight loss efforts. I recommend Consuella begin the structured treatment plan as follows:  She has agreed to Category 3 Plan  Exercise goals: All adults should avoid inactivity. Some activity is better than none, and adults who participate in any amount of physical activity, gain some health benefits.  Behavioral modification strategies:increasing lean protein intake, increasing vegetables, increase H2O intake, decrease liquid calories, increase high fiber foods, decreasing eating out, no skipping meals, meal planning and cooking strategies, keeping healthy foods in the home, better snacking choices, avoiding temptations, planning for success, and decrease junk food   She was informed of the importance of frequent follow-up visits to maximize her success with intensive lifestyle modifications for her multiple health conditions. She was informed we would discuss her lab results at her next visit unless there is a critical issue that needs to be addressed sooner. Adalynne agreed to keep her next visit at the agreed upon time to discuss these results.  Objective:  General: Cooperative, alert, well developed, in no acute distress. HEENT: Conjunctivae and lids unremarkable. Cardiovascular: Regular rhythm.  Lungs: Normal work of breathing. Neurologic: No focal deficits.   Lab Results  Component Value Date   CREATININE 0.54 (L) 09/06/2023   BUN 10 09/06/2023   NA 142 09/06/2023   K 4.2 09/06/2023   CL 104 09/06/2023   CO2 25 09/06/2023   Lab Results  Component Value Date   ALT 19 09/06/2023   AST 19 09/06/2023   ALKPHOS 95 09/06/2023   BILITOT 0.3 09/06/2023   Lab Results  Component Value Date   HGBA1C 5.8 (H) 05/20/2021   HGBA1C 6.4 (H) 02/02/2021   No results found for: "INSULIN" Lab Results  Component Value Date   TSH 2.200 04/27/2023   Lab Results  Component Value Date   CHOL 206 (H) 09/06/2023   HDL 62 01/25/2023   LDLCALC 85 01/25/2023   TRIG 225 (H)  09/06/2023   CHOLHDL 2.8 01/25/2023   Lab Results  Component Value Date   WBC 9.9 09/06/2023   HGB 14.1 09/06/2023   HCT 43.9 09/06/2023   MCV 89 09/06/2023   PLT 315 09/06/2023   Lab Results  Component Value Date   IRON 67 08/20/2014   TIBC 341 08/20/2014   FERRITIN 83 08/20/2014    Attestation Statements:  Reviewed by clinician on day of visit: allergies, medications,  problem list, medical history, surgical history, family history, social history, and previous encounter notes.  Time spent on visit including pre-visit chart review and post-visit charting and care was 50 minutes.   Glennis Brink, DO

## 2024-01-17 ENCOUNTER — Telehealth: Payer: Self-pay | Admitting: *Deleted

## 2024-01-17 LAB — CBC WITH DIFFERENTIAL/PLATELET
Basophils Absolute: 0.1 10*3/uL (ref 0.0–0.2)
Basos: 1 %
EOS (ABSOLUTE): 0.3 10*3/uL (ref 0.0–0.4)
Eos: 3 %
Hematocrit: 44.1 % (ref 34.0–46.6)
Hemoglobin: 14.6 g/dL (ref 11.1–15.9)
Immature Grans (Abs): 0 10*3/uL (ref 0.0–0.1)
Immature Granulocytes: 0 %
Lymphocytes Absolute: 3.4 10*3/uL — ABNORMAL HIGH (ref 0.7–3.1)
Lymphs: 27 %
MCH: 29.8 pg (ref 26.6–33.0)
MCHC: 33.1 g/dL (ref 31.5–35.7)
MCV: 90 fL (ref 79–97)
Monocytes Absolute: 0.7 10*3/uL (ref 0.1–0.9)
Monocytes: 5 %
Neutrophils Absolute: 8 10*3/uL — ABNORMAL HIGH (ref 1.4–7.0)
Neutrophils: 64 %
Platelets: 267 10*3/uL (ref 150–450)
RBC: 4.9 x10E6/uL (ref 3.77–5.28)
RDW: 13.1 % (ref 11.7–15.4)
WBC: 12.4 10*3/uL — ABNORMAL HIGH (ref 3.4–10.8)

## 2024-01-17 LAB — COMPREHENSIVE METABOLIC PANEL
ALT: 16 IU/L (ref 0–32)
AST: 16 IU/L (ref 0–40)
Albumin: 4.2 g/dL (ref 3.9–4.9)
Alkaline Phosphatase: 97 IU/L (ref 44–121)
BUN/Creatinine Ratio: 22 (ref 9–23)
BUN: 11 mg/dL (ref 6–24)
Bilirubin Total: 0.5 mg/dL (ref 0.0–1.2)
CO2: 24 mmol/L (ref 20–29)
Calcium: 9.1 mg/dL (ref 8.7–10.2)
Chloride: 101 mmol/L (ref 96–106)
Creatinine, Ser: 0.51 mg/dL — ABNORMAL LOW (ref 0.57–1.00)
Globulin, Total: 2.4 g/dL (ref 1.5–4.5)
Glucose: 83 mg/dL (ref 70–99)
Potassium: 4.4 mmol/L (ref 3.5–5.2)
Sodium: 140 mmol/L (ref 134–144)
Total Protein: 6.6 g/dL (ref 6.0–8.5)
eGFR: 117 mL/min/{1.73_m2} (ref >59)

## 2024-01-17 LAB — LIPID PANEL
Chol/HDL Ratio: 2.6 ratio (ref 0.0–4.4)
Cholesterol, Total: 169 mg/dL (ref 100–199)
HDL: 66 mg/dL (ref >39)
LDL Chol Calc (NIH): 76 mg/dL (ref 0–99)
Triglycerides: 160 mg/dL — ABNORMAL HIGH (ref 0–149)
VLDL Cholesterol Cal: 27 mg/dL (ref 5–40)

## 2024-01-17 LAB — VITAMIN B12: Vitamin B-12: 490 pg/mL (ref 232–1245)

## 2024-01-17 LAB — FOLATE: Folate: 5.8 ng/mL (ref >3.0)

## 2024-01-17 LAB — T3: T3, Total: 146 ng/dL (ref 71–180)

## 2024-01-17 LAB — HEMOGLOBIN A1C
Est. average glucose Bld gHb Est-mCnc: 128 mg/dL
Hgb A1c MFr Bld: 6.1 % — ABNORMAL HIGH (ref 4.8–5.6)

## 2024-01-17 LAB — INSULIN, RANDOM: INSULIN: 21.4 u[IU]/mL (ref 2.6–24.9)

## 2024-01-17 LAB — VITAMIN D 25 HYDROXY (VIT D DEFICIENCY, FRACTURES): Vit D, 25-Hydroxy: 36.1 ng/mL (ref 30.0–100.0)

## 2024-01-17 LAB — T4, FREE: Free T4: 1.02 ng/dL (ref 0.82–1.77)

## 2024-01-17 LAB — TSH: TSH: 1.88 u[IU]/mL (ref 0.450–4.500)

## 2024-01-17 NOTE — Telephone Encounter (Signed)
 Prior authorization done via cover my meds for patients wegovy waiting on determination.

## 2024-01-18 NOTE — Telephone Encounter (Signed)
 Prior Authorization for [employer]. Other options can be discussed at the next visit.  WE DENIED: Denied Denied Code 1 Code 2 Plan Service Description Dates Amount Wegovy Inj 0.25mg  Medicaid 01/17/2024  We denied your request for:  Wegovy Inj 0.25mg   Medical Necessity

## 2024-01-20 ENCOUNTER — Ambulatory Visit: Payer: Medicaid Other | Attending: Cardiovascular Disease | Admitting: Cardiovascular Disease

## 2024-01-20 ENCOUNTER — Encounter: Payer: Self-pay | Admitting: Cardiovascular Disease

## 2024-01-20 VITALS — BP 132/86 | HR 92 | Ht 64.0 in | Wt 317.0 lb

## 2024-01-20 DIAGNOSIS — E782 Mixed hyperlipidemia: Secondary | ICD-10-CM

## 2024-01-20 DIAGNOSIS — Z720 Tobacco use: Secondary | ICD-10-CM | POA: Diagnosis not present

## 2024-01-20 DIAGNOSIS — G4733 Obstructive sleep apnea (adult) (pediatric): Secondary | ICD-10-CM | POA: Diagnosis not present

## 2024-01-20 DIAGNOSIS — Z8249 Family history of ischemic heart disease and other diseases of the circulatory system: Secondary | ICD-10-CM

## 2024-01-20 NOTE — Assessment & Plan Note (Signed)
 Quit smoking 3 years ago

## 2024-01-20 NOTE — Assessment & Plan Note (Signed)
 History of obstructive sleep apnea intolerant to CPAP.  Referring her back to her sleep doctor, Dr. Mayford Knife, to pursue BiPAP.

## 2024-01-20 NOTE — Progress Notes (Signed)
 01/20/2024 Robin Bender   02-Apr-1976  [id number]  Primary Physician Richardean Chimera, MD Primary Cardiologist: Runell Gess MD [name], [place]  HPI:  Robin Bender is a 48 y.o.  severely overweight (BMI 53) divorced Caucasian female mother of 2 children and 4 stepchildren, grandmother of 1 grandchild referred by Laurel Dimmer, PA-C for cardiovascular valuation because of risk factors and family history. S I last saw her in the office 10/21/2022.  She is accompanied by her mother Arline Asp today.  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 Factors include currently smoking 1 pack of cigarettes per week down from a pack every other day. She has been smoking since age 36. She has untreated hyperlipidemia as well as family history of heart disease the father had a myocardial infarction and stents at the age of 6. She is never had a heart attack or stroke. She denies chest pain or shortness of breath.     She had a coronary calcium score on her 04/04/2020 which was 0. She does have obstructive sleep apnea however she is intolerant to CPAP.  Since I saw her a year ago she has begun to see the weight loss center and is committed to weight loss.  She denies chest pain or shortness of breath.     Current Meds  Medication Sig   ACCU-CHEK GUIDE test strip    albuterol (VENTOLIN HFA) 108 (90 Base) MCG/ACT inhaler Inhale 2 puffs into the lungs every 6 (six) hours as needed for wheezing or shortness of breath.   amphetamine-dextroamphetamine (ADDERALL) 20 MG tablet Take 20 mg by mouth daily. Takes when she remembers to take it.   atorvastatin (LIPITOR) 20 MG tablet TAKE ONE (1) TABLET EACH DAY   budesonide-formoterol (SYMBICORT) 160-4.5 MCG/ACT inhaler Inhale into the lungs.   Cholecalciferol (VITAMIN D3) 10 MCG (400 UNIT) CAPS Take 400 Units by mouth daily.   estradiol (ESTRACE) 1 MG tablet Take 1 mg by mouth daily.    famotidine (PEPCID) 20 MG tablet One after supper   metFORMIN (GLUCOPHAGE-XR) 500 MG 24 hr tablet Take 500 mg by mouth at bedtime.   oxyCODONE-acetaminophen (PERCOCET) 10-325 MG tablet Take 1 tablet by mouth every 4 (four) hours as needed for pain.   pantoprazole (PROTONIX) 40 MG tablet Take 1 tablet (40 mg total) by mouth daily. Take 30-60 min before first meal of the day     Allergies  Allergen Reactions   Acetaminophen-Codeine Rash   Codeine Other (See Comments)    Blisters around mouth   Demerol Hives   Ampicillin-Sulbactam Sodium Rash    Rash, redness noted around iv site,     Social History   Socioeconomic History   Marital status: Media planner    Spouse name: Not on file   Number of children: 2   Years of education: Not on file   Highest education level: Not on file  Occupational History   Not on file  Tobacco Use   Smoking status: Former    Current packs/day: 0.00    Average packs/day: 0.3 packs/day for 22.0 years (5.5 ttl pk-yrs)    Types: Cigarettes    Start date: 07/30/1998    Quit date: 07/30/2020    Years since quitting: 3.4    Passive exposure: Past   Smokeless tobacco: Never   Tobacco comments:    reduce the # of cigarettes  Vaping Use   Vaping status: Never Used  Substance and Sexual Activity   Alcohol use: No   Drug use: No   Sexual activity: Not Currently    Partners: Male    Birth control/protection: Surgical    Comment: hyst  Other Topics Concern   Not on file  Social History Narrative   Not on file   Social Drivers of Health   Financial Resource Strain: Not on file  Food Insecurity: Low Risk  (05/13/2023)   Received from [facility]   Hunger Vital Sign    Worried About Running Out of Food in the Last Year: Never true    Ran Out of Food in the Last Year: Never true  Transportation Needs: Not on file (05/13/2023)  Physical Activity: Not on file  Stress: Not on file  Social Connections: Unknown (03/12/2022)   Received from [facility]   Social Network    Social Network: Not on file  Intimate Partner Violence: Unknown (02/10/2022)   [facility] from [facility]   HITS    Physically Hurt: Not on file    Insult or Talk Down To: Not on file    Threaten Physical Harm: Not on file    Scream or Curse: Not on file     Review of Systems: General: negative for chills, fever, night sweats or weight changes.  Cardiovascular: negative for chest pain, dyspnea on exertion, edema, orthopnea, palpitations, paroxysmal nocturnal dyspnea or shortness of breath Dermatological: negative for rash Respiratory: negative for cough or wheezing Urologic: negative for hematuria Abdominal: negative for nausea, vomiting, diarrhea, bright red blood per rectum, melena, or hematemesis Neurologic: negative for visual changes, syncope, or dizziness All other systems reviewed and are otherwise negative except as noted above.    Blood pressure 132/86, pulse 92, height 5\' 4"  (1.626 m), weight (!) 317 lb (143.8 kg), SpO2 93%.  General appearance: alert and no distress Neck: no adenopathy, no carotid bruit, no JVD, supple, symmetrical, trachea midline, and thyroid not enlarged, symmetric, no tenderness/mass/nodules Lungs: clear to auscultation bilaterally Heart: regular rate and rhythm, S1, S2 normal, no murmur, click, rub or gallop Extremities: extremities normal, atraumatic, no cyanosis or edema Pulses: 2+ and symmetric Skin: Skin color, texture, turgor normal. No rashes or lesions Neurologic: Grossly normal  EKG not performed today      ASSESSMENT AND PLAN:   OSA on CPAP History of obstructive sleep apnea intolerant to CPAP.  Referring her back to her sleep doctor, Dr. Mayford Knife, to pursue BiPAP.  Hyperlipidemia History of hyperlipidemia on statin therapy with lipid profile performed 01/16/2024 revealing total cholesterol 169, LDL of 76 and HDL of 66.  Family history of heart disease Both mother and father  have CAD.  Tobacco abuse Quit smoking 3 years ago.  Morbid obesity (HCC) Morbid obesity with a BMI 54.  She has begun to see the diet wellness center.  Her weight target is 200.     Runell Gess MD FACP,FACC,FAHA, [facility] 01/20/2024 10:20 AM

## 2024-01-20 NOTE — Assessment & Plan Note (Signed)
 History of hyperlipidemia on statin therapy with lipid profile performed 01/16/2024 revealing total cholesterol 169, LDL of 76 and HDL of 66.

## 2024-01-20 NOTE — Assessment & Plan Note (Signed)
 Morbid obesity with a BMI 54.  She has begun to see the diet wellness center.  Her weight target is 200.

## 2024-01-20 NOTE — Assessment & Plan Note (Signed)
 Both mother and father have CAD.

## 2024-01-20 NOTE — Patient Instructions (Addendum)
 Medication Instructions:  Your physician recommends that you continue on your current medications as directed. Please refer to the Current Medication list given to you today.  *If you need a refill on your cardiac medications before your next appointment, please call your pharmacy*   Follow-Up: At [facility], you and your health needs are our priority.  As part of our continuing mission to provide you with exceptional heart care, we have created designated Provider Care Teams.  These Care Teams include your primary Cardiologist (physician) and Advanced Practice Providers (APPs -  Physician Assistants and Nurse Practitioners) who all work together to provide you with the care you need, when you need it.  We recommend signing up for the patient portal called "MyChart".  Sign up information is provided on this After Visit Summary.  MyChart is used to connect with patients for Virtual Visits (Telemedicine).  Patients are able to view lab/test results, encounter notes, upcoming appointments, etc.  Non-urgent messages can be sent to your provider as well.   To learn more about what you can do with MyChart, go to [email or web address].    Your next appointment:   12 month(s)  Provider:   Edd Fabian, FNP, Rito Ehrlich, or Joni Reining, DNP, ANP        Then, Nanetta Batty, MD will plan to see you again in 2 year(s).    Other Instructions Please call Dr. Norris Cross office to schedule follow up appointment.    1st Floor: - Lobby - Registration  - Pharmacy  - Lab - Cafe  2nd Floor: - PV Lab - Diagnostic Testing (echo, CT, nuclear med)  3rd Floor: - Vacant  4th Floor: - TCTS (cardiothoracic surgery) - AFib Clinic - Structural Heart Clinic - Vascular Surgery  - Vascular Ultrasound  5th Floor: - HeartCare Cardiology (general and EP) - Clinical Pharmacy for coumadin, hypertension, lipid, weight-loss medications, and med management  appointments    Valet parking services will be available as well.

## 2024-01-23 ENCOUNTER — Ambulatory Visit (HOSPITAL_COMMUNITY)

## 2024-01-30 ENCOUNTER — Ambulatory Visit (HOSPITAL_COMMUNITY)
Admission: RE | Admit: 2024-01-30 | Discharge: 2024-01-30 | Disposition: A | Discharge status: Home / Self Care | Source: Ambulatory Visit | Attending: Gastroenterology | Admitting: Gastroenterology

## 2024-01-30 DIAGNOSIS — K760 Fatty (change of) liver, not elsewhere classified: Secondary | ICD-10-CM | POA: Diagnosis present

## 2024-01-31 ENCOUNTER — Encounter: Payer: Self-pay | Admitting: Family Medicine

## 2024-01-31 ENCOUNTER — Ambulatory Visit: Admitting: Family Medicine

## 2024-01-31 VITALS — BP 127/88 | HR 82 | Temp 97.7°F | Ht 64.0 in | Wt 310.0 lb

## 2024-01-31 DIAGNOSIS — R7989 Other specified abnormal findings of blood chemistry: Secondary | ICD-10-CM | POA: Insufficient documentation

## 2024-01-31 DIAGNOSIS — E66813 Obesity, class 3: Secondary | ICD-10-CM | POA: Diagnosis not present

## 2024-01-31 DIAGNOSIS — R7303 Prediabetes: Secondary | ICD-10-CM | POA: Insufficient documentation

## 2024-01-31 DIAGNOSIS — K760 Fatty (change of) liver, not elsewhere classified: Secondary | ICD-10-CM

## 2024-01-31 DIAGNOSIS — E88819 Insulin resistance, unspecified: Secondary | ICD-10-CM | POA: Insufficient documentation

## 2024-01-31 DIAGNOSIS — Z6841 Body Mass Index (BMI) 40.0 and over, adult: Secondary | ICD-10-CM

## 2024-01-31 DIAGNOSIS — F509 Eating disorder, unspecified: Secondary | ICD-10-CM | POA: Insufficient documentation

## 2024-01-31 DIAGNOSIS — G4733 Obstructive sleep apnea (adult) (pediatric): Secondary | ICD-10-CM

## 2024-01-31 MED ORDER — METFORMIN HCL ER 750 MG PO TB24: 750.0000 mg | ORAL_TABLET | Freq: Every day | ORAL | 0 refills | Status: DC

## 2024-01-31 NOTE — Progress Notes (Signed)
 Office: [phone]  /  Fax: [phone]  WEIGHT SUMMARY AND BIOMETRICS  Starting Date: 01/16/24  Starting Weight: 314lb   Weight Lost Since Last Visit: 4lb   Vitals Temp: 97.7 F (36.5 C) BP: 127/88 Pulse Rate: 82 SpO2: 93 %   Body Composition  Body Fat %: 55 % Fat Mass (lbs): 170.6 lbs Muscle Mass (lbs): 132.6 lbs Total Body Water (lbs): 102.2 lbs Visceral Fat Rating : 21   HPI  Chief Complaint: OBESITY  Robin Bender is here to discuss her progress with her obesity treatment plan. She is on the the Category 3 Plan and states she is following her eating plan approximately 70 % of the time. She states she is exercising 30 minutes 4 times per week.  Interval History:  Since last office visit she is down 4 lb She is eating on her plan most of the time feeling full but needing 2-3 snacks/ day She has cut back on meals out and is making better choices when doing so She is staying off sweets She is keeping healthier foods and home with a good support She is awaiting a new Bipap for OSA with continued poor sleep Robin Bender has not yet been covered by insurance She is using a seated elliptical 3 days/ wk  Pharmacotherapy: none  PHYSICAL EXAM:  Blood pressure 127/88, pulse 82, temperature 97.7 F (36.5 C), height 5\' 4"  (1.626 m), weight (!) 310 lb (140.6 kg), SpO2 93%. Body mass index is 53.21 kg/m.  General: She is overweight, cooperative, alert, well developed, and in no acute distress. PSYCH: Has normal mood, affect and thought process.   Lungs: Normal breathing effort, no conversational dyspnea.   ASSESSMENT AND PLAN  TREATMENT PLAN FOR OBESITY:  Recommended Dietary Goals  Robin Bender is currently in the action stage of change. As such, her goal is to continue weight management plan. She has agreed to cat 3 meal plan   Behavioral Intervention  We discussed the following Behavioral Modification Strategies today: increasing lean protein intake to established goals,  increasing vegetables, increasing fiber rich foods, avoiding skipping meals, increasing water intake , work on meal planning and preparation, keeping healthy foods at home, identifying sources and decreasing liquid calories, decreasing eating out or consumption of processed foods, and making healthy choices when eating convenient foods, practice mindfulness eating and understand the difference between hunger signals and cravings, work on managing stress, creating time for self-care and relaxation, continue to practice mindfulness when eating, planning for success, and continue to work on maintaining a reduced calorie state, getting the recommended amount of protein, incorporating whole foods, making healthy choices, staying well hydrated and practicing mindfulness when eating..  Additional resources provided today: NA  Recommended Physical Activity Goals  Robin Bender has been advised to work up to 150 minutes of moderate intensity aerobic activity a week and strengthening exercises 2-3 times per week for cardiovascular health, weight loss maintenance and preservation of muscle mass.   She has agreed to Increase the intensity, frequency or duration of aerobic exercises    Pharmacotherapy changes for the treatment of obesity: retry PA for Wegovy 0.25 mg once weekly injection  ASSOCIATED CONDITIONS ADDRESSED TODAY  Prediabetes Lab Results  Component Value Date   HGBA1C 6.1 (H) 01/16/2024   Reviewed labs with patient from last visit.  She is currently on metformin XR 500 mg once daily for prediabetes and insulin resistance.  She is tolerating this well without adverse side effect.  She has started her active plan for weight  reduction on prescribed food plan, reducing added sugar and refined carbohydrates.  She has added in regular exercise 3 days a week.  Continue to work on current lifestyle changes.  Increase metformin XR to 750 mg once daily  Class 3 severe obesity due to excess calories with  serious comorbidity and body mass index (BMI) of 50.0 to 59.9 in adult [facility])  Metabolic dysfunction-associated steatotic liver disease (MASLD) She has started her active plan for weight reduction for the treatment of MASLD Liver enzymes normal on recent labs.  She denies intake of alcohol.  Continue active plan for weight reduction.  OSA (obstructive sleep apnea) She has been intolerant to CPAP.  She is awaiting a BiPAP, managed by Dr. Mayford Knife.  She has started her active plan for weight reduction.  Her sleep continues to be poor and this may also be impeding her weight progress.  Look for improvements once she starts BiPAP, aiming for 7 to 8 hours of sleep at night  Insulin resistance Reviewed labs with patient.  Fasting insulin high at 21.4.  She has only recently reduced her intake of starches and sweets.  She has added regular exercise 3 days a week.  She is tolerating metformin XR 500 mg once daily at bedtime.  Continue regular exercise, current prescribed dietary plan and increase metformin XR to 750 mg once daily    metFORMIN HCl ER; Take 1 tablet (750 mg total) by mouth at bedtime.  Dispense: 90 tablet; Refill: 0   Low serum vitamin D Vitamin D level normal reviewed with patient from last visit 36.1.  Vitamin D is important for energy level, bone health and immune function.  Recommend taking an over-the-counter D3 supplement 2000 IUs once daily.  Repeat level in the Fall . Eating disorder, unspecified type She has a long history of emotional eating.  She is here with her mom for support today.  Her last PHQ-9 was rather high and she is declines needing medication for depression or anxiety.  She agrees to starting CBT with Dr. Dewaine Conger.  Continue to work on eating on a schedule, healthy food choices and keeping junk food out of the house.  Work on mindfulness and meal planning.   -      She was informed of the importance of frequent follow up visits to maximize her success with  intensive lifestyle modifications for her multiple health conditions.   ATTESTASTION STATEMENTS:  Reviewed by clinician on day of visit: allergies, medications, problem list, medical history, surgical history, family history, social history, and previous encounter notes pertinent to obesity diagnosis.   I have personally spent 30 minutes total time today in preparation, patient care, nutritional counseling and documentation for this visit, including the following: review of clinical lab tests; review of medical tests/procedures/services.      Glennis Brink, DO DABFM, DABOM [facility] [address] [place], [place] [address] [phone]

## 2024-02-01 NOTE — Telephone Encounter (Signed)
 Re faxed over prior authorization for patients Wegovy to [facility] community plan. Waiting on determination.

## 2024-02-16 ENCOUNTER — Ambulatory Visit: Admitting: Internal Medicine

## 2024-02-16 ENCOUNTER — Encounter: Payer: Self-pay | Admitting: Internal Medicine

## 2024-02-16 VITALS — BP 124/78 | HR 86 | Ht 64.0 in | Wt 312.0 lb

## 2024-02-16 DIAGNOSIS — R058 Other specified cough: Secondary | ICD-10-CM | POA: Diagnosis not present

## 2024-02-16 DIAGNOSIS — Z8616 Personal history of COVID-19: Secondary | ICD-10-CM

## 2024-02-16 MED ORDER — BUDESONIDE 0.25 MG/2ML IN SUSP
RESPIRATORY_TRACT | 12 refills | Status: AC
Start: 2024-02-16 — End: ?

## 2024-02-16 MED ORDER — METHYLPREDNISOLONE ACETATE 80 MG/ML IJ SUSP
120.0000 mg | Freq: Once | INTRAMUSCULAR | Status: AC
Start: 2024-02-16 — End: 2024-02-16
  Administered 2024-02-16: 120 mg via INTRAMUSCULAR

## 2024-02-16 NOTE — Progress Notes (Signed)
 Robin Bender, female    DOB: 1976/02/03    MRN: [id number]   Brief patient profile:  47  yowf  quit smoking 07/23/2020 at onset of covid  ref[facility] clinic [facility]  04/27/2023 by Maynard Spears, PA in [place] for post covid sinus problems and cough/ doe.  Baseline wt 275 prior to COVID   Onset sinus problems around 2016 seasonal spring mostly rx otc's then hosp in [place] for covid (not prior vac) 07/23/22 hosp about a week > d/c on 02 x sev weeks with refractory nasal congestion pnds and chronic minimally prod cough slt grey   OSA on CPAP x 2023 > can't use it due to nasal obst > has been referred to Dr Micael Adas July 1st   ENT eval 07/23/22 [place] > neg ear and larynx exam > rec trial of singulair trial > one month no better so stopped    History of Present Illness  04/27/2023  Pulmonary/ 1st office eval/ [name] / [place] [facility] on symbiocrt 160  Chief Complaint  Patient presents with   Consult  Dyspnea:  able to walk prior to vertigo but was able to do a treadmill 2 weeks prior to OV  x 30 min @ level  Cough: p wakes  Sleep: bed is flat/ one pillow  SABA use: ventolin x sev weeks  02: none  Rec Pantoprazole (protonix) 40 mg   Take  30-60 min before first meal of the day and Pepcid (famotidine)  20 mg after supper until return to office  GERD diet reviewed, bed blocks rec  Call ENT today and explain your ears are hurting, you can't breathe through your nose  and you are dizzy  Regarding ventolin (albuterol)  - it may not be needed once you address your upper airway problems but here is what I recommend  Only use your albuterol as a rescue medication Also  Ok to try albuterol x 2 puffs 15 min before an activity (on alternating days)  that you know would usually make you short of breath  Pulmonary follow is as needed   Allergy screen 04/27/2023 >  Eos 0.3 /  IgE  123   07/28/2023  f/u ov/[place] office/[name] re: UACS/MO with doe maint on symbicort 160  prn    Chief Complaint  Patient presents with   Follow-up    Follow up  Dyspnea:  back at gym doing  treadmill / recumbent bike 20 min each / 2-3 x weekly  Cough: mostly in am's / overall better but still c/o pnds /mucoid  Sleeping: flat bed/ one pillow s   resp cc  SABA use: rarely  02: none   Rec Stop protonix and just take pepcid 20 mg after supper/bedtime to see if cough is worse  Try zyrtec 10 mg one daily (24 h) try taking at bedtime  Symbicort 160 is up to 2 puffs every 12 hours work on inhaler technique See your ENT doctor as soon as possible -  see allergy next if not satisfied   Pulmonary follow up is as needed   02/16/2024  f/u ov/[place] office/[name] re: UACS/ MO  maint on protonix p supper  / clariton/ restarted symbicort x one month no better  Chief Complaint  Patient presents with   Cough  Dyspnea:  worse since cough with subjective wheeze  Cough:non productive  x one month rx steroid shot/ abx not even termporary relief  Sleeping: flat bed one pillow occ cough worse p  stirs  SABA use: neb every other day /hfa once a day  02: none     No obvious day to day or daytime variability or assoc excess/ purulent sputum or mucus plugs or hemoptysis or cp or chest tightness, or overt sinus or hb symptoms.    Also denies any obvious fluctuation of symptoms with weather or environmental changes or other aggravating or alleviating factors except as outlined above   No unusual exposure hx or h/o childhood pna/ asthma or knowledge of premature birth.  Current Allergies, Complete Past Medical History, Past Surgical History, Family History, and Social History were reviewed in [employer] record.  ROS  The following are not active complaints unless bolded Hoarseness, sore throat, dysphagia/ globus, dental problems, itching, sneezing,  nasal congestion or discharge of excess mucus or purulent secretions, ear ache,   fever, chills, sweats, unintended wt loss or wt  gain, classically pleuritic or exertional cp,  orthopnea pnd or arm/hand swelling  or leg swelling, presyncope, palpitations, abdominal pain, anorexia, nausea, vomiting, diarrhea  or change in bowel habits or change in bladder habits, change in stools or change in urine, dysuria, hematuria,  rash, arthralgias, visual complaints, headache, numbness, weakness or ataxia or problems with walking or coordination,  change in mood or  memory.        Current Meds  Medication Sig   ACCU-CHEK GUIDE test strip    albuterol (VENTOLIN HFA) 108 (90 Base) MCG/ACT inhaler Inhale 2 puffs into the lungs every 6 (six) hours as needed for wheezing or shortness of breath.   amphetamine-dextroamphetamine (ADDERALL XR) 20 MG 24 hr capsule Take 20 mg by mouth every morning.   atorvastatin (LIPITOR) 20 MG tablet TAKE ONE (1) TABLET EACH DAY   budesonide-formoterol (SYMBICORT) 160-4.5 MCG/ACT inhaler Inhale into the lungs.   Cholecalciferol (VITAMIN D3) 10 MCG (400 UNIT) CAPS Take 400 Units by mouth daily.   estradiol (ESTRACE) 1 MG tablet Take 1 mg by mouth daily.   famotidine (PEPCID) 20 MG tablet One after supper   metFORMIN (GLUCOPHAGE-XR) 750 MG 24 hr tablet Take 1 tablet (750 mg total) by mouth at bedtime.   oxyCODONE-acetaminophen (PERCOCET) 10-325 MG tablet Take 1 tablet by mouth every 4 (four) hours as needed for pain.   pantoprazole (PROTONIX) 40 MG tablet Take 1 tablet (40 mg total) by mouth daily. Take 30-60 min before first meal of the day   triamcinolone cream (KENALOG) 0.1 % Apply topically.   WEGOVY 0.25 MG/0.5ML SOAJ              Past Medical History:  Diagnosis Date   Anemia    Anxiety    Arthritis    Bipolar disorder (HCC)    Depression    Fatigue    Fibromyalgia    Headache    Pneumonia due to COVID-19 virus 07/30/2020   [facility]   PTSD (post-traumatic stress disorder)    Sleep apnea        Objective:    Wts  02/16/2024            312   07/28/23 (!) 317 lb 3.2 oz (143.9  kg)  04/27/23 (!) 312 lb 3.2 oz (141.6 kg)  03/18/23 (!) 307 lb (139.3 kg)    Vital signs reviewed  02/16/2024  - Note at rest 02 sats  94% on RA   General appearance:    amb MO (by bmi) wf nad    HEENT : Oropharynx  clear  Nasal turbinates nl    NECK :  without  apparent JVD/ palpable Nodes/TM and classic variable pseudowheeze     LUNGS: no acc muscle use,  Nl contour chest which is clear to A and P bilaterally without cough on insp or exp maneuvers   CV:  RRR  no s3 or murmur or increase in P2, and no edema   ABD:  quite obese soft and nontender   MS:  Gait nl   ext warm without deformities Or obvious joint restrictions  calf tenderness, cyanosis or clubbing    SKIN: warm and dry without lesions    NEURO:  alert, approp, nl sensorium with  no motor or cerebellar deficits apparent.       Assessment

## 2024-02-16 NOTE — Patient Instructions (Addendum)
 Pantoprazole (protonix) 40 mg   Take  30-60 min before first meal of the day and Pepcid (famotidine)  20 mg after supper until return to office - this is the best way to tell whether stomach acid is contributing to your problem.    GERD (REFLUX)  is an extremely common cause of respiratory symptoms just like yours , many times with no obvious heartburn at all.    It can be treated with medication, but also with lifestyle changes including elevation of the head of your bed (ideally with 6 -8inch blocks under the headboard of your bed),  Smoking cessation, avoidance of late meals, excessive alcohol, and avoid fatty foods, chocolate, peppermint, colas, red wine, and acidic juices such as orange juice.  NO MINT OR MENTHOL PRODUCTS SO NO COUGH DROPS  USE SUGARLESS CANDY INSTEAD (Jolley ranchers or Stover's or Life Savers) or even ice chips will also do - the key is to swallow to prevent all throat clearing. NO OIL BASED VITAMINS - use powdered substitutes.  Avoid fish oil when coughing.   When coughing really hard stop symbicort and ventolin spray and just use nebulizer albuterol with budesonide 0.25 mg twice daily    Take delsym two tsp every 12 hours and supplement if needed with percocet 10  take one up to every  4 hours to suppress the urge to cough. Swallowing water and/or using ice chips/non mint and menthol containing candies (such as lifesavers or sugarless jolly ranchers) are also effective.  You should rest your voice and avoid activities that you know make you cough.  For drainage / throat tickle  stop clariton try take CHLORPHENIRAMINE  4 mg  ("Allergy Relief" 4mg   at [facility] should be easiest to find in the blue box usually on bottom shelf)  take one every 4 hours as needed - extremely effective and inexpensive over the counter- may cause drowsiness so start with just a dose or two an hour before bedtime and see how you tolerate it before trying in daytime.     Depomedrol 120 mg  IM   Call ENT for follow up - make "video" recording with your phone up over your throat  Pulmonary work up is as needed

## 2024-02-19 NOTE — Assessment & Plan Note (Addendum)
 Onset ? 2016 much worse since covid 19 07/23/22  - ENT eval 07/23/22 [place] > neg ear and larynx exam > rec trial of singulair trial > one month no better so pt d/c'd  - CT head /sinus   04/11/23 neg  assoc with vertigo  - Allergy screen 04/27/2023 >  Eos 0.3 /  IgE  123> refer to allergy  prn p ent eval complete  - 07/28/2023 added zyrtec prn and wean off gerd rx if tol - flare 01/2024  with classic pseudowheeze on exam 02/16/2024   Of the three most common causes of  Sub-acute / recurrent or chronic cough, only one (GERD)  can actually contribute to/ trigger  the other two (asthma and post nasal drip syndrome)  and perpetuate the cylce of cough.  While not intuitively obvious, many patients with chronic low grade reflux do not cough until there is a primary insult that disturbs the protective epithelial barrier and exposes sensitive nerve endings.   This is typically viral but can due to PNDS and  either may apply here.   The point is that once this occurs, it is difficult to eliminate the cycle  using anything but a maximally effective acid suppression regimen at least in the short run, accompanied by an appropriate diet to address non acid GERD and control / eliminate the cough itself for at least 3 days with percocet plus 1st gen H1 blockers per guidelines  to stop all pnds and >>> also added  depomedrol  120 mg IM  in case of component of Th-2 driven upper or lower airways inflammation (if cough responds short term only to relapse before return while will on full rx for uacs (as above), then that would point to allergic rhinitis/ asthma or eos bronchitis as alternative dx)    >>> add budesonide to neb bid since hfa may aggravate cough and hold symbicort during severe flares like this  >>> f/u ENT planned   Pulmonary f/u is prn   .dismn      Each maintenance medication was reviewed in detail including emphasizing most importantly the difference between maintenance and prns and under what  circumstances the prns are to be triggered using an action plan format where appropriate.  Total time for H and P, chart review, counseling, reviewing hfa device(s) and generating customized AVS unique to this office visit / same day charting = 35 min

## 2024-02-20 ENCOUNTER — Other Ambulatory Visit: Payer: Self-pay | Admitting: Cardiovascular Disease

## 2024-02-27 ENCOUNTER — Ambulatory Visit: Admitting: Family Medicine

## 2024-02-27 ENCOUNTER — Encounter: Payer: Self-pay | Admitting: Family Medicine

## 2024-02-27 VITALS — BP 113/78 | Temp 98.0°F | Ht 64.0 in | Wt 300.0 lb

## 2024-02-27 DIAGNOSIS — K760 Fatty (change of) liver, not elsewhere classified: Secondary | ICD-10-CM

## 2024-02-27 DIAGNOSIS — R7303 Prediabetes: Secondary | ICD-10-CM

## 2024-02-27 DIAGNOSIS — G4733 Obstructive sleep apnea (adult) (pediatric): Secondary | ICD-10-CM

## 2024-02-27 DIAGNOSIS — E66813 Obesity, class 3: Secondary | ICD-10-CM | POA: Diagnosis not present

## 2024-02-27 DIAGNOSIS — Z6841 Body Mass Index (BMI) 40.0 and over, adult: Secondary | ICD-10-CM

## 2024-02-27 MED ORDER — WEGOVY 0.5 MG/0.5ML ~~LOC~~ SOAJ: 0.5000 mg | SUBCUTANEOUS | 0 refills | Status: DC

## 2024-02-27 NOTE — Progress Notes (Signed)
 Office: [phone]  /  Fax: [phone]  WEIGHT SUMMARY AND BIOMETRICS  Starting Date: 01/16/24  Starting Weight: 314 lb   Weight Lost Since Last Visit: 10 lb   Vitals Temp: 98 F (36.7 C) BP: 113/78   Body Composition  Body Fat %: 54 % Fat Mass (lbs): 162.4 lbs Muscle Mass (lbs): 131.4 lbs Total Body Water (lbs): 99.2 lbs Visceral Fat Rating : 20    HPI  Chief Complaint: OBESITY  Robin Bender is here to discuss her progress with her obesity treatment plan. She is on the the Category 3 Plan and states she is following her eating plan approximately 50 % of the time. She states she is exercising 60 minutes 3-4 times per week.  Interval History:  Since last office visit she is down 10 lb This includes a 0.8 lb muscle loss and a 8.2 lb body fat loss This gives her a total weight loss of 14 pounds in the past 6 weeks of medically supervised weight management This is a 4.4% total body weight loss She was able to start Wegovy  0.25 mg 3 weeks ago Noticed slight improvement in earlier satiety Being on listening to fullness cues and reducing late-night eating Prioritizing lean protein and fiber with fruits and vegetables Has been going to the gym doing cardio and resistance training 3 days a week for 1 hour   Pharmacotherapy: Wegovy  0.25 mg once weekly injections  PHYSICAL EXAM:  Blood pressure 113/78, temperature 98 F (36.7 C), height 5\' 4"  (1.626 m), weight 300 lb (136.1 kg). Body mass index is 51.49 kg/m.  General: She is overweight, cooperative, alert, well developed, and in no acute distress. PSYCH: Has normal mood, affect and thought process.   Lungs: Normal breathing effort, no conversational dyspnea.   ASSESSMENT AND PLAN  TREATMENT PLAN FOR OBESITY:  Recommended Dietary Goals  Robin Bender is currently in the action stage of change. As such, her goal is to continue weight management plan. She has agreed to the Category 3 Plan. She has the option to create  new plans using a 1600 cal/day goal with 110 g of protein daily  Behavioral Intervention  We discussed the following Behavioral Modification Strategies today: increasing lean protein intake to established goals, increasing fiber rich foods, avoiding skipping meals, increasing water intake , work on meal planning and preparation, keeping healthy foods at home, identifying sources and decreasing liquid calories, practice mindfulness eating and understand the difference between hunger signals and cravings, avoiding temptations and identifying enticing environmental cues, continue to practice mindfulness when eating, and continue to work on maintaining a reduced calorie state, getting the recommended amount of protein, incorporating whole foods, making healthy choices, staying well hydrated and practicing mindfulness when eating..  Additional resources provided today: NA  Recommended Physical Activity Goals  Robin Bender has been advised to work up to 150 minutes of moderate intensity aerobic activity a week and strengthening exercises 2-3 times per week for cardiovascular health, weight loss maintenance and preservation of muscle mass.   She has agreed to Increase the intensity, frequency or duration of strengthening exercises  and Increase the intensity, frequency or duration of aerobic exercises    Pharmacotherapy changes for the treatment of obesity: Increase Wegovy  to 0.5 mg once weekly injection  ASSOCIATED CONDITIONS ADDRESSED TODAY  Metabolic dysfunction-associated steatotic liver disease (MASLD) Reviewed results of her right upper quadrant ultrasound with elastography done in 01/26/2024 showing fatty infiltration of her liver with elastography score of 2.9, probably normal  Continue active plan  for weight reduction for the primary treatment of metabolic dysfunction associated steatotic liver disease.  Limit alcohol consumption.  Class 3 severe obesity due to excess calories with serious  comorbidity and body mass index (BMI) of 50.0 to 59.9 in adult (HCC) -     Wegovy ; Inject 0.5 mg into the skin once a week.  Dispense: 2 mL; Refill: 0 Complete week 4 of Wegovy  0.25 mg once weekly injection and increase to 0.5 mg once weekly injection.  Continue prescribed dietary changes along with regular exercise Jertson cardio and resistance training 3 days a week.  Prediabetes Lab Results  Component Value Date   HGBA1C 6.1 (H) 01/16/2024  Tolerating increased dose of metformin  XR to 750 mg once daily without adverse side effects. Continue active plan for weight reduction, reducing added sugar and refined carbohydrates along with continuing regular exercise.  Recheck A1c in 3 months  OSA (obstructive sleep apnea) Awaiting new BiPAP machine.  Working on weight reduction and improved sleep hygiene     She was informed of the importance of frequent follow up visits to maximize her success with intensive lifestyle modifications for her multiple health conditions.   ATTESTASTION STATEMENTS:  Reviewed by clinician on day of visit: allergies, medications, problem list, medical history, surgical history, family history, social history, and previous encounter notes pertinent to obesity diagnosis.      Micky Albee, D.O. DABFM, DABOM [facility] [address] [place], [place] [address] [phone]

## 2024-02-28 ENCOUNTER — Encounter (INDEPENDENT_AMBULATORY_CARE_PROVIDER_SITE_OTHER): Payer: Self-pay

## 2024-02-28 ENCOUNTER — Other Ambulatory Visit: Payer: Self-pay | Admitting: Neurological Surgery

## 2024-02-28 ENCOUNTER — Encounter (INDEPENDENT_AMBULATORY_CARE_PROVIDER_SITE_OTHER): Admitting: Psychology

## 2024-02-28 ENCOUNTER — Telehealth (INDEPENDENT_AMBULATORY_CARE_PROVIDER_SITE_OTHER): Payer: Self-pay | Admitting: Psychology

## 2024-02-28 DIAGNOSIS — H4710 Unspecified papilledema: Secondary | ICD-10-CM

## 2024-02-28 NOTE — Progress Notes (Signed)
 ENTERED IN ERROR

## 2024-02-28 NOTE — Telephone Encounter (Signed)
  Office: [phone]  /  Fax: [phone]  Date of Encounter: February 28, 2024  Time of Encounter: 8:54am Duration of Encounter: ~4 minute(s) Provider: Catherene Close, PsyD  CONTENT:  Robin Bender presented for today's appointment via MyChart Video Visit, but she was observed in the car. She explained she was in the passenger seat, but her mother and sister were present in the car driving to [facility]. This provider expressed concerns related to confidentiality as well as poor connection, as it was difficult to consistently hear/see Robin Bender. She acknowledged understanding and was receptive to rescheduling today's appointment. No evidence or endorsement of safety concerns. All questions/concerns addressed.   PLAN: Robin Bender is scheduled for an appointment on 03/19/2024 at 9am via MyChart Video Visit.

## 2024-03-02 ENCOUNTER — Ambulatory Visit
Admission: RE | Admit: 2024-03-02 | Discharge: 2024-03-02 | Disposition: A | Discharge status: Home / Self Care | Source: Ambulatory Visit | Attending: Neurological Surgery | Admitting: Neurological Surgery

## 2024-03-02 DIAGNOSIS — H4710 Unspecified papilledema: Secondary | ICD-10-CM

## 2024-03-06 ENCOUNTER — Other Ambulatory Visit: Payer: Self-pay | Admitting: Neurological Surgery

## 2024-03-06 DIAGNOSIS — H4710 Unspecified papilledema: Secondary | ICD-10-CM

## 2024-03-07 NOTE — Discharge Instructions (Signed)

## 2024-03-09 ENCOUNTER — Encounter: Payer: Self-pay | Admitting: Radiology

## 2024-03-09 ENCOUNTER — Ambulatory Visit
Admission: RE | Admit: 2024-03-09 | Discharge: 2024-03-09 | Disposition: A | Discharge status: Home / Self Care | Source: Ambulatory Visit | Attending: Neurological Surgery | Admitting: Neurological Surgery

## 2024-03-09 VITALS — BP 172/100 | HR 80

## 2024-03-09 DIAGNOSIS — H4710 Unspecified papilledema: Secondary | ICD-10-CM

## 2024-03-19 ENCOUNTER — Telehealth (INDEPENDENT_AMBULATORY_CARE_PROVIDER_SITE_OTHER): Admitting: Psychology

## 2024-03-22 ENCOUNTER — Encounter: Payer: Self-pay | Admitting: Family Medicine

## 2024-03-22 ENCOUNTER — Ambulatory Visit: Admitting: Family Medicine

## 2024-03-22 VITALS — BP 118/83 | HR 88 | Temp 98.2°F | Ht 64.0 in | Wt 297.0 lb

## 2024-03-22 DIAGNOSIS — F329 Major depressive disorder, single episode, unspecified: Secondary | ICD-10-CM

## 2024-03-22 DIAGNOSIS — E66813 Obesity, class 3: Secondary | ICD-10-CM

## 2024-03-22 DIAGNOSIS — G4733 Obstructive sleep apnea (adult) (pediatric): Secondary | ICD-10-CM

## 2024-03-22 DIAGNOSIS — G932 Benign intracranial hypertension: Secondary | ICD-10-CM

## 2024-03-22 DIAGNOSIS — Z6841 Body Mass Index (BMI) 40.0 and over, adult: Secondary | ICD-10-CM | POA: Diagnosis not present

## 2024-03-22 DIAGNOSIS — Z7985 Long-term (current) use of injectable non-insulin antidiabetic drugs: Secondary | ICD-10-CM

## 2024-03-22 DIAGNOSIS — E119 Type 2 diabetes mellitus without complications: Secondary | ICD-10-CM

## 2024-03-22 DIAGNOSIS — Z7984 Long term (current) use of oral hypoglycemic drugs: Secondary | ICD-10-CM

## 2024-03-22 DIAGNOSIS — E88819 Insulin resistance, unspecified: Secondary | ICD-10-CM

## 2024-03-22 DIAGNOSIS — R7989 Other specified abnormal findings of blood chemistry: Secondary | ICD-10-CM

## 2024-03-22 MED ORDER — WEGOVY 1 MG/0.5ML ~~LOC~~ SOAJ
1.0000 mg | SUBCUTANEOUS | 0 refills | Status: AC
Start: 2024-03-22 — End: ?

## 2024-03-22 MED ORDER — BUPROPION HCL ER (XL) 150 MG PO TB24: 150.0000 mg | ORAL_TABLET | Freq: Every day | ORAL | 0 refills | Status: DC

## 2024-03-22 NOTE — Progress Notes (Signed)
 Office: [phone]  /  Fax: [phone]  WEIGHT SUMMARY AND BIOMETRICS  Starting Date: 01/16/24  Starting Weight: 314lb   Weight Lost Since Last Visit: 3lb   Vitals Temp: 98.2 F (36.8 C) BP: 118/83 Pulse Rate: 88 SpO2: 93 %   Body Composition  Body Fat %: 53.9 % Fat Mass (lbs): 160.2 lbs Muscle Mass (lbs): 130.2 lbs Total Body Water (lbs): 98.8 lbs Visceral Fat Rating : 20   HPI  Chief Complaint: OBESITY  Monserrate is here to discuss her progress with her obesity treatment plan. She is on the the Category 3 Plan and states she is following her eating plan approximately 50-60 % of the time. She states she is exercising 30-60 minutes 1-2 times per week.  Interval History:  Since last office visit she is down 3 lb This gives her a net weight loss of 17 lb in 2 mos of medically supervised weight management This is a 5.4% TBW loss in 2 mos using Wegovy  + reduced kcal diet She has been emotional eating sweets due to a new diagnosis of intracranial HTN She she has improved control of food volumes on Wegovy  She is walking on the treadmill 15 min and weights 2 x a week at [employer] She admits to oversnacking but is still making better food choices She did not complete CBT with Dr Delaine Favorite and has been struggling more with depressed mood   Pharmacotherapy: Wegovy  0.5 mg weekly  PHYSICAL EXAM:  Blood pressure 118/83, pulse 88, temperature 98.2 F (36.8 C), height 5\' 4"  (1.626 m), weight 297 lb (134.7 kg), SpO2 93%. Body mass index is 50.98 kg/m.  General: She is overweight, cooperative, alert, well developed, and in no acute distress. PSYCH: Has normal mood, affect and thought process.   Lungs: Normal breathing effort, no conversational dyspnea. Here with her mom  ASSESSMENT AND PLAN  TREATMENT PLAN FOR OBESITY:  Recommended Dietary Goals  Yadira is currently in the action stage of change. As such, her goal is to continue weight management plan. She has  agreed to the Category 3 Plan and keeping a food journal and adhering to recommended goals of 1600 calories and 110 g of  protein.  Behavioral Intervention  We discussed the following Behavioral Modification Strategies today: increasing lean protein intake to established goals, increasing fiber rich foods, avoiding skipping meals, increasing water intake , work on meal planning and preparation, work on Counselling psychologist calories using tracking application, keeping healthy foods at home, identifying sources and decreasing liquid calories, practice mindfulness eating and understand the difference between hunger signals and cravings, work on managing stress, creating time for self-care and relaxation, avoiding temptations and identifying enticing environmental cues, planning for success, and continue to work on maintaining a reduced calorie state, getting the recommended amount of protein, incorporating whole foods, making healthy choices, staying well hydrated and practicing mindfulness when eating..  Additional resources provided today: NA  Recommended Physical Activity Goals  Janijah has been advised to work up to 150 minutes of moderate intensity aerobic activity a week and strengthening exercises 2-3 times per week for cardiovascular health, weight loss maintenance and preservation of muscle mass.   She has agreed to Increase the intensity, frequency or duration of strengthening exercises  and Increase the intensity, frequency or duration of aerobic exercises    Pharmacotherapy changes for the treatment of obesity: increase Wegovy  to 1 mg weekly  ASSOCIATED CONDITIONS ADDRESSED TODAY  Reactive depression Worsening She has not been taking any  medication for mood but has felt depressed and is struggling more with emotional eating. She is a good candidate for use of Wellbutrin XL 150 mg qAM Work on improving sleep, nutrition, exercise Keep junk food out of the house and practice  mindfulness -     buPROPion HCl ER (XL); Take 1 tablet (150 mg total) by mouth daily.  Dispense: 30 tablet; Refill: 0  Class 3 severe obesity due to excess calories with serious comorbidity and body mass index (BMI) of 50.0 to 59.9 in adult -     Wegovy ; Inject 1 mg into the skin once a week.  Dispense: 2 mL; Refill: 0  IIH (idiopathic intracranial hypertension) Recent diagnosis following MRI 03/02/24 awaiting a visit with [facility].  Has been seeing Dr Rochelle Chu, Neurosurgery.  She has had symptoms of HA and blurry vision, asymptomatic today  Keep upcoming visit with [facility] Reviewed how weight loss can help IIH Plan to hold Wegovy  if she will be getting stent placed  Type 2 diabetes mellitus without complication, without long-term current use of insulin  (HCC) Lab Results  Component Value Date   HGBA1C 6.1 (H) 01/16/2024   Doing well with weight reduction, down >5% TBW in 2 mos Continue to limit added sugar and starches Increase workouts to 3 days/ wk Repeat A1c in 3 mos  OSA (obstructive sleep apnea) Awaiting visit with Dr Micael Adas 5/28 Did not do well on CPAP Snoring and sleep have improved with weight reduction already Continue active plan for weight loss  Low serum vitamin D  Last vitamin D  Lab Results  Component Value Date   VD25OH 36.1 01/16/2024  Reminded her to take OTC vitamin D  2,000 international units  daily Repeat in 3 mos  Insulin  resistance Doing well on metformin  XR 750 mg daily without adverse SE Doing well with lifestyle changes and weight reduction     She was informed of the importance of frequent follow up visits to maximize her success with intensive lifestyle modifications for her multiple health conditions.   ATTESTASTION STATEMENTS:  Reviewed by clinician on day of visit: allergies, medications, problem list, medical history, surgical history, family history, social history, and previous encounter notes pertinent to obesity diagnosis.   I have personally  spent 30 minutes total time today in preparation, patient care, nutritional counseling and education,  and documentation for this visit, including the following: review of most recent clinical lab tests, prescribing medications/ refilling medications, reviewing medical assistant documentation, review and interpretation of bioimpedence results.     Micky Albee, D.O. DABFM, DABOM [facility] [address] [place], [place] [address] [phone]

## 2024-03-22 NOTE — Patient Instructions (Addendum)
 Increase walking time to 20 min and keep weight training at the gym-- aim for 3 days/ wk  Finish out Wegovy  0.5 mg weekly then increase to 1 mg weekly  Keep metformin  XR at 750 mg daily  Begin Wellbutrin XL 150 mg in the morning for mood/ emotional eating  Practice mindfulness Keep stress levels low Aim for a good 8 hrs of sleep at night Aim for a good 120 oz of water daily

## 2024-03-24 LAB — CSF CULTURE W GRAM STAIN
MICRO NUMBER:: 16405844
Result:: NO GROWTH
SPECIMEN QUALITY:: ADEQUATE

## 2024-03-24 LAB — CSF CELL COUNT WITH DIFFERENTIAL
RBC Count, CSF: 0 {cells}/uL
TOTAL NUCLEATED CELL: 2 {cells}/uL (ref 0–5)

## 2024-03-24 LAB — GLUCOSE, CSF: Glucose, CSF: 64 mg/dL (ref 40–80)

## 2024-03-24 LAB — PROTEIN, CSF: Total Protein, CSF: 23 mg/dL (ref 15–45)

## 2024-04-04 ENCOUNTER — Encounter (HOSPITAL_BASED_OUTPATIENT_CLINIC_OR_DEPARTMENT_OTHER): Admitting: Cardiology

## 2024-04-09 ENCOUNTER — Telehealth (INDEPENDENT_AMBULATORY_CARE_PROVIDER_SITE_OTHER): Admitting: Psychology

## 2024-04-09 DIAGNOSIS — F909 Attention-deficit hyperactivity disorder, unspecified type: Secondary | ICD-10-CM

## 2024-04-09 DIAGNOSIS — F419 Anxiety disorder, unspecified: Secondary | ICD-10-CM

## 2024-04-09 DIAGNOSIS — F5089 Other specified eating disorder: Secondary | ICD-10-CM

## 2024-04-09 DIAGNOSIS — F32A Depression, unspecified: Secondary | ICD-10-CM | POA: Diagnosis not present
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
## 2024-04-25 ENCOUNTER — Encounter: Payer: Self-pay | Admitting: Family Medicine

## 2024-04-25 ENCOUNTER — Ambulatory Visit: Admitting: Family Medicine

## 2024-04-25 VITALS — BP 121/83 | HR 91 | Temp 97.9°F | Ht 64.0 in | Wt 294.0 lb

## 2024-04-25 DIAGNOSIS — F331 Major depressive disorder, recurrent, moderate: Secondary | ICD-10-CM | POA: Diagnosis not present

## 2024-04-25 DIAGNOSIS — R7303 Prediabetes: Secondary | ICD-10-CM | POA: Diagnosis not present

## 2024-04-25 DIAGNOSIS — E66813 Obesity, class 3: Secondary | ICD-10-CM | POA: Diagnosis not present

## 2024-04-25 DIAGNOSIS — F329 Major depressive disorder, single episode, unspecified: Secondary | ICD-10-CM

## 2024-04-25 DIAGNOSIS — G932 Benign intracranial hypertension: Secondary | ICD-10-CM

## 2024-04-25 DIAGNOSIS — E88819 Insulin resistance, unspecified: Secondary | ICD-10-CM

## 2024-04-25 DIAGNOSIS — Z6841 Body Mass Index (BMI) 40.0 and over, adult: Secondary | ICD-10-CM | POA: Diagnosis not present

## 2024-04-25 MED ORDER — WEGOVY 1 MG/0.5ML ~~LOC~~ SOAJ: 1.0000 mg | SUBCUTANEOUS | 0 refills | Status: DC

## 2024-04-25 MED ORDER — METFORMIN HCL ER 750 MG PO TB24: 750.0000 mg | ORAL_TABLET | Freq: Every day | ORAL | 0 refills | Status: DC

## 2024-04-25 MED ORDER — BUPROPION HCL ER (XL) 150 MG PO TB24: 150.0000 mg | ORAL_TABLET | Freq: Every day | ORAL | 0 refills | Status: DC

## 2024-04-25 NOTE — Patient Instructions (Addendum)
 For bowel regimen: Increase water intake to 120 oz / day Add in more fresh fruits and veggies Add in COLACE 1-2 x day every day for stool softening Try out SMOOTH MOVE tea once daily -- in the evening  Watch out for sweets and treats Little walks can help your mental health

## 2024-04-25 NOTE — Progress Notes (Signed)
 Office: [phone]  /  Fax: [phone]  WEIGHT SUMMARY AND BIOMETRICS  Starting Date: 01/16/24  Starting Weight: 314lb   Weight Lost Since Last Visit: 3lb   Vitals Temp: 97.9 F (36.6 C) BP: 121/83 Pulse Rate: 91 SpO2: 95 %   Body Composition  Body Fat %: 54 % Fat Mass (lbs): 158.8 lbs Muscle Mass (lbs): 128.4 lbs Total Body Water (lbs): 99.8 lbs Visceral Fat Rating : 19   HPI  Chief Complaint: OBESITY  Robin Bender is here to discuss her progress with her obesity treatment plan. She is on the the Category 3 Plan and states she is following her eating plan approximately 20 % of the time. She states she is exercising 0 minutes 0 times per week.  Interval History:  Since last office visit she is down 3 lb She is finishing up a round of steroids with a respiratory infection She has had no energy for cooking or exercise and steroid have made her hungry She has a net weight loss of 20 lb in the past 3 mos of medically supervised weight management This is a 6.3% TBW loss She has had an increase in constipation She did go up on Wegovy  to 1 mg weekly She has been able to reduce portion sizes She is seeing a neurosurgeon at [facility] for IIH with worsening headaches She has been referred to behavioral health by Dr Delaine Favorite (psychology)  Pharmacotherapy: Wegovy  1 mg weekly + Metformin  XR 750 mg daily + Wellburtin XL 150 mg daily  PHYSICAL EXAM:  Blood pressure 121/83, pulse 91, temperature 97.9 F (36.6 C), height 5' 4 (1.626 m), weight 294 lb (133.4 kg), SpO2 95%. Body mass index is 50.46 kg/m.  General: She is overweight, cooperative, alert, well developed, and in no acute distress. PSYCH: Has normal mood, affect and thought process.   Lungs: Normal breathing effort, no conversational dyspnea.  ASSESSMENT AND PLAN  TREATMENT PLAN FOR OBESITY:  Recommended Dietary Goals  Robin Bender is currently in the action stage of change. As such, her goal is to continue weight  management plan. She has agreed to the Category 3 Plan.  Behavioral Intervention  We discussed the following Behavioral Modification Strategies today: increasing lean protein intake to established goals, increasing fiber rich foods, increasing water intake , work on meal planning and preparation, keeping healthy foods at home, practice mindfulness eating and understand the difference between hunger signals and cravings, work on managing stress, creating time for self-care and relaxation, avoiding temptations and identifying enticing environmental cues, continue to practice mindfulness when eating, planning for success, better snacking choices, and continue to work on maintaining a reduced calorie state, getting the recommended amount of protein, incorporating whole foods, making healthy choices, staying well hydrated and practicing mindfulness when eating..  Additional resources provided today: NA  Recommended Physical Activity Goals  Robin Bender has been advised to work up to 150 minutes of moderate intensity aerobic activity a week and strengthening exercises 2-3 times per week for cardiovascular health, weight loss maintenance and preservation of muscle mass.   She has agreed to Think about enjoyable ways to increase daily physical activity and overcoming barriers to exercise and Increase physical activity in their day and reduce sedentary time (increase NEAT).  Pharmacotherapy changes for the treatment of obesity: none  ASSOCIATED CONDITIONS ADDRESSED TODAY  Major depressive disorder, recurrent episode, moderate (HCC) Unchanged She has not seen much change in mood with the addition of Wellbutrin  XL 150 mg daily and had has SE of increased  sweating.  She was seeing Dr Delaine Favorite for emotional eating and has been referred to behavioral health.  She is not a threat to herself or others but lacks much motivation to plan out meals or exercise.  We discussed the importance of addressing mood in the  context of active weight reduction. Work on improving nutrient intake, sleep at night and getting in short bouts of physical activity. Continue Wellbutrin  XL 150 mg daily   Class 3 severe obesity due to excess calories with serious comorbidity and body mass index (BMI) of 50.0 to 59.9 in adult -     Wegovy ; Inject 1 mg into the skin once a week.  Dispense: 2 mL; Refill: 0 Due to worsening constipation and adequate satiety, will keep Wegovy  at 1 mg weekly injection We reviewed a bowel regimen on her AVS to address drug induced constipation  Prediabetes Lab Results  Component Value Date   HGBA1C 6.1 (H) 01/16/2024   Continue metformin  XR 750 mg daily, tolerating well Update BMP, A1c, B12 next visit Has room for improvement with reducing sugar intake and regular exercise -     metFORMIN  HCl ER; Take 1 tablet (750 mg total) by mouth at bedtime.  Dispense: 90 tablet; Refill: 0  Insulin  resistance -     metFORMIN  HCl ER; Take 1 tablet (750 mg total) by mouth at bedtime.  Dispense: 90 tablet; Refill: 0  Reactive depression -     buPROPion  HCl ER (XL); Take 1 tablet (150 mg total) by mouth daily.  Dispense: 30 tablet; Refill: 0  IIH (idiopathic intracranial hypertension) Keep upcoming visits with [facility] Actively working on weight reduction and is down 6% TBW in 3 mos of medically supervised weight management Frequent headaches have affected her mood and her ability to ramp up physical activity    She was informed of the importance of frequent follow up visits to maximize her success with intensive lifestyle modifications for her multiple health conditions.   ATTESTASTION STATEMENTS:  Reviewed by clinician on day of visit: allergies, medications, problem list, medical history, surgical history, family history, social history, and previous encounter notes pertinent to obesity diagnosis.   I have personally spent 32 minutes total time today in preparation, patient care,  nutritional counseling and education,  and documentation for this visit, including the following: review of most recent clinical lab tests, prescribing medications/ refilling medications, reviewing medical assistant documentation, review and interpretation of bioimpedence results.     Micky Albee, D.O. DABFM, DABOM [facility] [address] [place], [place] [address] [phone]

## 2024-04-27 ENCOUNTER — Other Ambulatory Visit: Payer: Self-pay | Admitting: Family Medicine

## 2024-04-27 DIAGNOSIS — Z6841 Body Mass Index (BMI) 40.0 and over, adult: Secondary | ICD-10-CM

## 2024-04-27 DIAGNOSIS — R7303 Prediabetes: Secondary | ICD-10-CM

## 2024-04-27 DIAGNOSIS — E88819 Insulin resistance, unspecified: Secondary | ICD-10-CM

## 2024-05-01 ENCOUNTER — Telehealth (INDEPENDENT_AMBULATORY_CARE_PROVIDER_SITE_OTHER): Admitting: Psychology

## 2024-05-15 ENCOUNTER — Telehealth (INDEPENDENT_AMBULATORY_CARE_PROVIDER_SITE_OTHER): Admitting: Psychology

## 2024-05-15 DIAGNOSIS — F5089 Other specified eating disorder: Secondary | ICD-10-CM | POA: Diagnosis not present

## 2024-05-15 DIAGNOSIS — F419 Anxiety disorder, unspecified: Secondary | ICD-10-CM

## 2024-05-15 DIAGNOSIS — F909 Attention-deficit hyperactivity disorder, unspecified type: Secondary | ICD-10-CM | POA: Diagnosis not present

## 2024-05-15 DIAGNOSIS — F32A Depression, unspecified: Secondary | ICD-10-CM

## 2024-05-15 NOTE — Progress Notes (Signed)
  Office: [phone]  /  Fax: [phone]    Date: May 15, 2024  Appointment Start Time: 2:31pm Duration: 23 minutes Provider: Wyatt Fire, Psy.D. Type of Session: Individual Therapy  Location of Patient: Home (private location) Location of Provider: Provider's Home (private office) Type of Contact: Telepsychological Visit via MyChart Video Visit  Session Content: Robin Bender is a 48 y.o. female presenting for a follow-up appointment to address the previously established treatment goal of increasing coping skills.Today's appointment was a telepsychological visit. Robin Bender provided verbal consent for today's telepsychological appointment and she is aware she is responsible for securing confidentiality on her end of the session. Prior to proceeding with today's appointment, Robin Bender's physical location at the time of this appointment was obtained as well a phone number she could be reached at in the event of technical difficulties. Robin Bender and this provider participated in today's telepsychological service.   This provider conducted a brief check-in. Robin Bender shared about recent events, including traveling back and forth to [facility] and not feeling well today. Regarding eating, she discussed experiencing a fluctuation in her appetite, noting some instances of overeating. Reviewed emotional and physical hunger. Psychoeducation regarding triggers for emotional eating was provided. Robin Bender was provided a handout, and encouraged to utilize the handout between now and the next appointment to increase awareness of triggers and frequency. Robin Bender agreed. This provider also discussed behavioral strategies for specific triggers, such as placing the utensil down when conversing to avoid mindless eating. Robin Bender provided verbal consent during today's appointment for this provider to send a handout about triggers via e-mail. Overall, Robin Bender was receptive to today's appointment as evidenced by openness to sharing,  responsiveness to feedback, and willingness to explore triggers for emotional eating.  Mental Status Examination:  Appearance: neat Behavior: appropriate to circumstances Mood: sad Affect: mood congruent Speech: WNL Eye Contact: appropriate Psychomotor Activity: WNL Gait: unable to assess Thought Process: linear, logical, and goal directed and no evidence or endorsement of suicidal, homicidal, and self-harm ideation, plan and intent  Thought Content/Perception: no hallucinations, delusions, bizarre thinking or behavior endorsed or observed Orientation: AAOx4 Memory/Concentration: intact Insight: fair Judgment: fair  Interventions:  Conducted a brief chart review Provided empathic reflections and validation Reviewed content from the previous session Employed supportive psychotherapy interventions to facilitate reduced distress and to improve coping skills with identified stressors Psychoeducation provided regarding triggers for emotional eating behaviors  DSM-5 Diagnosis(es): F50.89 Other Specified Feeding or Eating Disorder, Emotional Eating Behaviors, F90.9 Unspecified Attention-Deficit/Hyperactivity Disorder , F41.9 Unspecified Anxiety Disorder, and  F32.A Unspecified Depressive Disorder  Treatment Goal & Progress: During the initial appointment with this provider, the following treatment goal was established: increase coping skills. Progress is limited, as Robin Bender has just begun treatment with this provider; however, she is receptive to the interaction and interventions and rapport is being established.   Plan: The next appointment is scheduled for 06/04/2024 at 10am, which will be via MyChart Video Visit. The next session will focus on working towards the established treatment goal.   Wyatt Fire, PsyD

## 2024-05-21 ENCOUNTER — Encounter: Payer: Self-pay | Admitting: Family Medicine

## 2024-05-21 ENCOUNTER — Ambulatory Visit (INDEPENDENT_AMBULATORY_CARE_PROVIDER_SITE_OTHER): Admitting: Family Medicine

## 2024-05-21 VITALS — BP 114/72 | HR 97 | Temp 97.6°F | Ht 64.0 in | Wt 290.0 lb

## 2024-05-21 DIAGNOSIS — Z6841 Body Mass Index (BMI) 40.0 and over, adult: Secondary | ICD-10-CM

## 2024-05-21 DIAGNOSIS — E88819 Insulin resistance, unspecified: Secondary | ICD-10-CM

## 2024-05-21 DIAGNOSIS — K5909 Other constipation: Secondary | ICD-10-CM | POA: Diagnosis not present

## 2024-05-21 DIAGNOSIS — R7303 Prediabetes: Secondary | ICD-10-CM

## 2024-05-21 DIAGNOSIS — F329 Major depressive disorder, single episode, unspecified: Secondary | ICD-10-CM | POA: Diagnosis not present

## 2024-05-21 DIAGNOSIS — G932 Benign intracranial hypertension: Secondary | ICD-10-CM | POA: Diagnosis not present

## 2024-05-21 DIAGNOSIS — E66813 Obesity, class 3: Secondary | ICD-10-CM

## 2024-05-21 MED ORDER — METFORMIN HCL ER 750 MG PO TB24: 750.0000 mg | ORAL_TABLET | Freq: Every day | ORAL | 0 refills | Status: DC

## 2024-05-21 MED ORDER — WEGOVY 1 MG/0.5ML ~~LOC~~ SOAJ: 1.0000 mg | SUBCUTANEOUS | 0 refills | Status: DC

## 2024-05-21 MED ORDER — BUPROPION HCL ER (XL) 150 MG PO TB24
150.0000 mg | ORAL_TABLET | Freq: Every day | ORAL | 0 refills | Status: DC
Start: 2024-05-21 — End: 2024-07-25

## 2024-05-21 MED ORDER — LUBIPROSTONE 24 MCG PO CAPS: 24.0000 ug | ORAL_CAPSULE | Freq: Every day | ORAL | 0 refills | Status: DC

## 2024-05-21 NOTE — Patient Instructions (Signed)
 Labs next visit  Begin Amitiza  24 mcg daily with breakfast  Keep low sugar greek yogurts, fresh fruit, cut up veggies, microwave frozen veggies, pre cooked chicken, deli [place], eggs on hand for easy meals  Stay as active as you can  Keep water intake 80+ oz/ day  Referral to Dr Dela made

## 2024-05-21 NOTE — Progress Notes (Signed)
 Office: [phone]  /  Fax: [phone]  WEIGHT SUMMARY AND BIOMETRICS  Starting Date: 01/16/24  Starting Weight: 314lb   Weight Lost Since Last Visit: 4lb   Vitals Temp: 97.6 F (36.4 C) BP: 114/72 Pulse Rate: 97 SpO2: 92 %   Body Composition  Body Fat %: 53.2 % Fat Mass (lbs): 154.4 lbs Muscle Mass (lbs): 129.2 lbs Total Body Water (lbs): 99.4 lbs Visceral Fat Rating : 19    HPI  Chief Complaint: OBESITY  Robin Bender is here to discuss her progress with her obesity treatment plan. She is on the the Category 3 Plan and states she is following her eating plan approximately 20 % of the time. She states she is exercising 30 minutes 1time per week.  Interval History:  Since last office visit she is down 4 lb She is up 0.8 lb of muscle mass and down 4.4 lb of body fat since last visit She is still on Wegovy  1 mg weekly She is still having constipation, with a hx of chronic constipation not relieved by Linzess She gets in >64 oz of water daily She is getting in more fruits than veggies She has early satiety with meals She is not a big snacker She has a net weight loss of 24 lb in 4 mos of medically supervised weight management This is a 7.6% TBW loss She has been seeing [facility] for her vision loss with IIH She has been stressed with everything going on She is still doing some emotional eating and habit snacking  Pharmacotherapy: Wegovy  1 mg once weekly injection  PHYSICAL EXAM:  Blood pressure 114/72, pulse 97, temperature 97.6 F (36.4 C), height 5' 4 (1.626 m), weight 290 lb (131.5 kg), SpO2 92%. Body mass index is 49.78 kg/m.  General: She is overweight, here with mom, cooperative, alert, well developed, and in no acute distress. PSYCH: Has normal mood, affect and thought process.   Lungs: Normal breathing effort, no conversational dyspnea.  ASSESSMENT AND PLAN  TREATMENT PLAN FOR OBESITY:  Recommended Dietary Goals  Robin Bender is currently in the  action stage of change. As such, her goal is to continue weight management plan. She has agreed to the Category 3 Plan.  Behavioral Intervention  We discussed the following Behavioral Modification Strategies today: increasing lean protein intake to established goals, increasing vegetables, increasing fiber rich foods, increasing water intake , keeping healthy foods at home, decreasing eating out or consumption of processed foods, and making healthy choices when eating convenient foods, work on managing stress, creating time for self-care and relaxation, avoiding temptations and identifying enticing environmental cues, planning for success, and continue to work on maintaining a reduced calorie state, getting the recommended amount of protein, incorporating whole foods, making healthy choices, staying well hydrated and practicing mindfulness when eating..  Additional resources provided today: NA  Recommended Physical Activity Goals  Robin Bender has been advised to work up to 150 minutes of moderate intensity aerobic activity a week and strengthening exercises 2-3 times per week for cardiovascular health, weight loss maintenance and preservation of muscle mass.   She has agreed to Think about enjoyable ways to increase daily physical activity and overcoming barriers to exercise and Increase physical activity in their day and reduce sedentary time (increase NEAT).  Pharmacotherapy changes for the treatment of obesity: None  ASSOCIATED CONDITIONS ADDRESSED TODAY  Chronic constipation History of chronic constipation, worsened with Wegovy  which has been rate limiting factor in increasing her dose of Wegovy  beyond 1 mg.  She has not seen relief using a stool softener twice daily.  She reports failing to see relief on MiraLAX in the past.  She has also failed to see relief using Linzess daily.  She does take oxycodone  daily for pain.  Reports 64+ ounces of water daily.  She lacks adequate fruit and vegetable  intake.  Begin Amitiza  24 mcg once daily with breakfast.  Increase intake of water to 85+ ounces per day and add in at least 2 servings of vegetables daily. -     Lubiprostone ; Take 1 capsule (24 mcg total) by mouth daily with breakfast.  Dispense: 30 capsule; Refill: 0  Prediabetes Lab Results  Component Value Date   HGBA1C 6.1 (H) 01/16/2024  She is doing well on metformin  XR 750 mg once daily without adverse side effects.  Labs will be due next visit.  Continue to work on reducing added sugar and starches.  She has been unable to increase exercise due to her headaches.  -     metFORMIN  HCl ER; Take 1 tablet (750 mg total) by mouth at bedtime.  Dispense: 90 tablet; Refill: 0  Insulin  resistance Recheck fasting insulin  next visit -     metFORMIN  HCl ER; Take 1 tablet (750 mg total) by mouth at bedtime.  Dispense: 90 tablet; Refill: 0  Reactive depression Stress levels remain high.  She has a good support system at home.  She is still struggling with some emotional eating.  She has started cognitive behavioral therapy with Dr. Sharron.  Continue working on stress reduction, improving sleep at night and keeping junk food out of the house. -     buPROPion  HCl ER (XL); Take 1 tablet (150 mg total) by mouth daily.  Dispense: 30 tablet; Refill: 0  Class 3 severe obesity due to excess calories with body mass index (BMI) of 45.0 to 49.9 in adult -     Wegovy ; Inject 1 mg into the skin once a week.  Dispense: 2 mL; Refill: 0 -     Amb Referral to Bariatric Surgery Reviewed patient's overall progress over time.  She is down 24 pounds in 4 months still with a BMI of 49.  Unable to increase Wegovy  due to some chronic meal skipping and worsening constipation.  Reviewed her long-term weight history and she has struggled with obesity for many years.  With her worsening vision with IIH and continued headaches, we discussed the role of bariatric surgery.  She agrees to attend the bariatric surgery seminar to  learn more about this option.  IIH (idiopathic intracranial hypertension) -     Amb Referral to Bariatric Surgery Continue plan of care with [facility] and use ophthalmology.  She reports not increasing her dose of Diamox to 500 twice daily just yet.  She is awaiting results of recent scan for possible pseudopapilledema OD.     She was informed of the importance of frequent follow up visits to maximize her success with intensive lifestyle modifications for her multiple health conditions.   ATTESTASTION STATEMENTS:  Reviewed by clinician on day of visit: allergies, medications, problem list, medical history, surgical history, family history, social history, and previous encounter notes pertinent to obesity diagnosis.   I have personally spent 32 minutes total time today in preparation, patient care, nutritional counseling and education,  and documentation for this visit, including the following: review of most recent clinical lab tests, prescribing medications/ refilling medications, reviewing medical assistant documentation, review and interpretation of bioimpedence results.  Robin Bender, D.O. DABFM, DABOM [facility] [address] [place], [place] [address] [phone]

## 2024-05-30 ENCOUNTER — Telehealth: Payer: Self-pay | Admitting: *Deleted

## 2024-05-30 NOTE — Telephone Encounter (Signed)
 Referral and office notes sent to Dr. Charlyn office. [phone].

## 2024-05-31 ENCOUNTER — Other Ambulatory Visit: Payer: Self-pay | Admitting: Family Medicine

## 2024-05-31 DIAGNOSIS — F329 Major depressive disorder, single episode, unspecified: Secondary | ICD-10-CM

## 2024-05-31 DIAGNOSIS — K5909 Other constipation: Secondary | ICD-10-CM

## 2024-05-31 DIAGNOSIS — E66813 Obesity, class 3: Secondary | ICD-10-CM

## 2024-06-04 ENCOUNTER — Telehealth (INDEPENDENT_AMBULATORY_CARE_PROVIDER_SITE_OTHER): Admitting: Psychology

## 2024-06-08 ENCOUNTER — Ambulatory Visit (HOSPITAL_BASED_OUTPATIENT_CLINIC_OR_DEPARTMENT_OTHER): Admitting: Cardiology

## 2024-06-17 ENCOUNTER — Other Ambulatory Visit: Payer: Self-pay | Admitting: Medical Genetics

## 2024-06-20 ENCOUNTER — Other Ambulatory Visit (HOSPITAL_COMMUNITY)
Admission: RE | Admit: 2024-06-20 | Discharge: 2024-06-20 | Disposition: A | Discharge status: Home / Self Care | Payer: Self-pay | Source: Ambulatory Visit | Attending: Medical Genetics | Admitting: Medical Genetics

## 2024-06-25 ENCOUNTER — Encounter: Payer: Self-pay | Admitting: Family Medicine

## 2024-06-25 ENCOUNTER — Ambulatory Visit: Admitting: Family Medicine

## 2024-06-25 VITALS — BP 111/78 | HR 87 | Temp 98.2°F | Ht 64.0 in | Wt 287.0 lb

## 2024-06-25 DIAGNOSIS — Z6841 Body Mass Index (BMI) 40.0 and over, adult: Secondary | ICD-10-CM

## 2024-06-25 DIAGNOSIS — R7989 Other specified abnormal findings of blood chemistry: Secondary | ICD-10-CM | POA: Diagnosis not present

## 2024-06-25 DIAGNOSIS — G932 Benign intracranial hypertension: Secondary | ICD-10-CM

## 2024-06-25 DIAGNOSIS — R5383 Other fatigue: Secondary | ICD-10-CM

## 2024-06-25 DIAGNOSIS — K5909 Other constipation: Secondary | ICD-10-CM

## 2024-06-25 DIAGNOSIS — R7303 Prediabetes: Secondary | ICD-10-CM

## 2024-06-25 DIAGNOSIS — E66813 Obesity, class 3: Secondary | ICD-10-CM

## 2024-06-25 DIAGNOSIS — E88819 Insulin resistance, unspecified: Secondary | ICD-10-CM | POA: Diagnosis not present

## 2024-06-25 NOTE — Progress Notes (Signed)
 Office: [phone]  /  Fax: [phone]  WEIGHT SUMMARY AND BIOMETRICS  Starting Date: 01/16/24  Starting Weight: 314lb   Weight Lost Since Last Visit: 3lb   Vitals Temp: 98.2 F (36.8 C) BP: 111/78 Pulse Rate: 87 SpO2: 93 %   Body Composition  Body Fat %: 53.6 % Fat Mass (lbs): 154.2 lbs Muscle Mass (lbs): 126.6 lbs Total Body Water (lbs): 98.4 lbs Visceral Fat Rating : 19     HPI  Chief Complaint: OBESITY  Robin Bender is here to discuss her progress with her obesity treatment plan. She is on the the Category 3 Plan and states she is following her eating plan approximately 0 % of the time. She states she is walking more and gym once a week.    Interval History:  Since last office visit she is down 3 lb She did hold Wegovy  2 weeks ago She has upcoming R sided stent with [facility] 8/28 She has been having vision changes and headaches She has had an increase in hunger off of Wegovy  She is craving more sugar She has a net weight loss of 27 lb in 5 mos This is an 8.5% total body weight loss She has been drinking Coke 10 oz a few days of the week and having some candy bars  Pharmacotherapy: none  PHYSICAL EXAM:  Blood pressure 111/78, pulse 87, temperature 98.2 F (36.8 C), height 5' 4 (1.626 m), weight 287 lb (130.2 kg), SpO2 93%. Body mass index is 49.26 kg/m.  General: She is overweight, cooperative, alert, well developed, and in no acute distress. PSYCH: Has normal mood, affect and thought process.   Lungs: Normal breathing effort, no conversational dyspnea.  ASSESSMENT AND PLAN  TREATMENT PLAN FOR OBESITY:  Recommended Dietary Goals  Robin Bender is currently in the action stage of change. As such, her goal is to continue weight management plan. She has agreed to the Category 3 Plan.  Behavioral Intervention  We discussed the following Behavioral Modification Strategies today: increasing lean protein intake to established goals, increasing  fiber rich foods, increasing water intake , work on meal planning and preparation, keeping healthy foods at home, identifying sources and decreasing liquid calories, work on managing stress, creating time for self-care and relaxation, avoiding temptations and identifying enticing environmental cues, continue to practice mindfulness when eating, planning for success, and continue to work on maintaining a reduced calorie state, getting the recommended amount of protein, incorporating whole foods, making healthy choices, staying well hydrated and practicing mindfulness when eating..  Additional resources provided today: NA  Recommended Physical Activity Goals  Robin Bender has been advised to work up to 150 minutes of moderate intensity aerobic activity a week and strengthening exercises 2-3 times per week for cardiovascular health, weight loss maintenance and preservation of muscle mass.   She has agreed to Think about enjoyable ways to increase daily physical activity and overcoming barriers to exercise and Increase physical activity in their day and reduce sedentary time (increase NEAT).  Pharmacotherapy changes for the treatment of obesity: d/c Wegovy   ASSOCIATED CONDITIONS ADDRESSED TODAY  IIH (idiopathic intracranial hypertension) Worsening with frequent headaches and vision changes Scheduled for R [facility] at [facility] 8/28 Her mom is here for support today Continue current plan of care per neurosurgeon Continue to work on weight reduction -- exploring bariatric surgery options Stay off of Wegovy   Low serum vitamin D  Last vitamin D  Lab Results  Component Value Date   VD25OH 36.1 01/16/2024  Not consistently taking OTC  vitamin D  Goal is >50-- repeat lab today  -     VITAMIN D  25 Hydroxy (Vit-D Deficiency, Fractures)  Insulin  resistance Doing well on metformin  XR 750 mg daily Has room for improvement reducing sugar intake and adding in more walking (limited by headaches) -     Insulin ,  random  Prediabetes Lab Results  Component Value Date   HGBA1C 6.1 (H) 01/16/2024   Repeat lab today Consider increasing metformin  dose -     Hemoglobin A1c  Other fatigue -     Vitamin B12 -     Basic metabolic panel with GFR  Class 3 severe obesity due to excess calories with body mass index (BMI) of 45.0 to 49.9 in adult  Chronic constipation Improved off of Wegovy  Never started RX Amitiza  Continue to work on water and fiber intake     She was informed of the importance of frequent follow up visits to maximize her success with intensive lifestyle modifications for her multiple health conditions.   ATTESTASTION STATEMENTS:  Reviewed by clinician on day of visit: allergies, medications, problem list, medical history, surgical history, family history, social history, and previous encounter notes pertinent to obesity diagnosis.   I have personally spent 30 minutes total time today in preparation, patient care, nutritional counseling and education,  and documentation for this visit, including the following: review of most recent clinical lab tests, prescribing medications/ refilling medications, reviewing medical assistant documentation, review and interpretation of bioimpedence results.     Robin Bender, D.O. DABFM, DABOM [facility] [address] [place], [place] [address] [phone]

## 2024-06-26 LAB — BASIC METABOLIC PANEL WITH GFR
BUN/Creatinine Ratio: 17 (ref 9–23)
BUN: 11 mg/dL (ref 6–24)
CO2: 23 mmol/L (ref 20–29)
Calcium: 9.1 mg/dL (ref 8.7–10.2)
Chloride: 103 mmol/L (ref 96–106)
Creatinine, Ser: 0.63 mg/dL (ref 0.57–1.00)
Glucose: 98 mg/dL (ref 70–99)
Potassium: 4.7 mmol/L (ref 3.5–5.2)
Sodium: 140 mmol/L (ref 134–144)
eGFR: 111 mL/min/1.73 (ref >59)

## 2024-06-26 LAB — INSULIN, RANDOM: INSULIN: 33.9 u[IU]/mL — ABNORMAL HIGH (ref 2.6–24.9)

## 2024-06-26 LAB — HEMOGLOBIN A1C
Est. average glucose Bld gHb Est-mCnc: 114 mg/dL
Hgb A1c MFr Bld: 5.6 % (ref 4.8–5.6)

## 2024-06-26 LAB — VITAMIN B12: Vitamin B-12: 501 pg/mL (ref 232–1245)

## 2024-06-26 LAB — VITAMIN D 25 HYDROXY (VIT D DEFICIENCY, FRACTURES): Vit D, 25-Hydroxy: 30.1 ng/mL (ref 30.0–100.0)

## 2024-06-30 LAB — GENECONNECT MOLECULAR SCREEN: Genetic Analysis Overall Interpretation: NEGATIVE

## 2024-07-02 ENCOUNTER — Ambulatory Visit: Payer: Self-pay | Admitting: Family Medicine

## 2024-07-06 ENCOUNTER — Encounter: Payer: Self-pay | Admitting: Gastroenterology

## 2024-07-25 ENCOUNTER — Encounter: Payer: Self-pay | Admitting: Family Medicine

## 2024-07-25 ENCOUNTER — Ambulatory Visit: Admitting: Family Medicine

## 2024-07-25 VITALS — BP 116/77 | HR 100 | Temp 97.8°F | Ht 64.0 in | Wt 293.0 lb

## 2024-07-25 DIAGNOSIS — G932 Benign intracranial hypertension: Secondary | ICD-10-CM | POA: Diagnosis not present

## 2024-07-25 DIAGNOSIS — R7303 Prediabetes: Secondary | ICD-10-CM

## 2024-07-25 DIAGNOSIS — F32A Depression, unspecified: Secondary | ICD-10-CM | POA: Diagnosis not present

## 2024-07-25 DIAGNOSIS — G4733 Obstructive sleep apnea (adult) (pediatric): Secondary | ICD-10-CM

## 2024-07-25 DIAGNOSIS — Z6841 Body Mass Index (BMI) 40.0 and over, adult: Secondary | ICD-10-CM

## 2024-07-25 DIAGNOSIS — F329 Major depressive disorder, single episode, unspecified: Secondary | ICD-10-CM

## 2024-07-25 DIAGNOSIS — E88819 Insulin resistance, unspecified: Secondary | ICD-10-CM

## 2024-07-25 DIAGNOSIS — E66813 Obesity, class 3: Secondary | ICD-10-CM

## 2024-07-25 MED ORDER — METFORMIN HCL ER 500 MG PO TB24: ORAL_TABLET | ORAL | 1 refills | Status: DC

## 2024-07-25 MED ORDER — BUPROPION HCL ER (XL) 150 MG PO TB24: 150.0000 mg | ORAL_TABLET | Freq: Every day | ORAL | 1 refills | Status: DC

## 2024-07-25 NOTE — Progress Notes (Signed)
 Office: [phone]  /  Fax: [phone]  WEIGHT SUMMARY AND BIOMETRICS  Starting Date: 01/16/24  Starting Weight: 314lb   Weight Lost Since Last Visit: 0lb   Vitals Temp: 97.8 F (36.6 C) BP: 116/77 Pulse Rate: 100 SpO2: 95 %   Body Composition  Body Fat %: 54.2 % Fat Mass (lbs): 159.2 lbs Muscle Mass (lbs): 127.8 lbs Total Body Water (lbs): 101.6 lbs Visceral Fat Rating : 20    HPI  Chief Complaint: OBESITY  Robin Bender is here to discuss her progress with her obesity treatment plan. She is on the the Category 3 Plan and states she is following her eating plan approximately 10 % of the time. She states she is exercising 0 minutes 0 times per week.  Interval History:  Since last office visit she is up 6 lb She has seen an increase in appetite and cravings off of Wegovy  She her last Wegovy  injection was 3 weeks ago Mood has been stable and plans to have another sleep study with Dr Javaid, previously intolerant to CPAP She  has a net weight loss of 21 lb in 6 mos of medically supervised weight management This is a 6.6% total body weight loss She is more prone to skipping breakfast Constipation has improved off of Wegovy  Headaches have improved She is back to drinking COKE She has started her pathway towards bariatric surgery with Dr. Dela.  She has additional appointments set up at [facility].  Pharmacotherapy: Metformin  XR 1000 mg once daily with dinner  PHYSICAL EXAM:  Blood pressure 116/77, pulse 100, temperature 97.8 F (36.6 C), height 5' 4 (1.626 m), weight 293 lb (132.9 kg), SpO2 95%. Body mass index is 50.29 kg/m.  General: She is overweight, cooperative, alert, well developed, and in no acute distress. PSYCH: Has normal mood, affect and thought process.   Lungs: Normal breathing effort, no conversational dyspnea.   ASSESSMENT AND PLAN  TREATMENT PLAN FOR OBESITY:  Recommended Dietary Goals  Robin Bender is currently in the action stage of  change. As such, her goal is to continue weight management plan. She has agreed to following a lower carbohydrate, vegetable and lean protein rich diet plan.  Behavioral Intervention  We discussed the following Behavioral Modification Strategies today: increasing lean protein intake to established goals, increasing vegetables, increasing water intake , keeping healthy foods at home, identifying sources and decreasing liquid calories, avoiding temptations and identifying enticing environmental cues, and planning for success.  Additional resources provided today: NA  Recommended Physical Activity Goals  Robin Bender has been advised to work up to 150 minutes of moderate intensity aerobic activity a week and strengthening exercises 2-3 times per week for cardiovascular health, weight loss maintenance and preservation of muscle mass.   She has agreed to [employer] strengthening exercises with a goal of 2-3 sessions a week  and Start aerobic activity with a goal of 150 minutes a week at moderate intensity.   Pharmacotherapy changes for the treatment of obesity: Restart Wellbutrin  XL 150 mg once daily   ASSOCIATED CONDITIONS ADDRESSED TODAY Depression Resume Wellbutrin  XL 150 mg once daily.  She has seen a drop in mood with emotional eating since stopping medication.  She has a good support system.  She will complete a preop psych eval in preparation for bariatric surgery  Insulin  resistance Doing well on metformin  XR 1000 mg once daily without GI upset. Continue metformin  at current dose.  Continue working on reducing carbohydrate and sugar intake and increasing walking time.  Prediabetes  Lab Results  Component Value Date   HGBA1C 5.6 06/25/2024  Resolved  IIH Managed by [facility] neurosurgery.  She would like to move forward with bariatric surgery in order to prevent need for surgery.  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  She has not been consistent with taking Diamox.  Look  for a reduction in headache frequency and intensity with further weight loss post-bariatric surgery.  OSA on CPAP She is set up for a sleep study with Dr. Janece.  She has a history of CPAP nonadherence.  Continue active plan for weight reduction and work on sleep hygiene.  She was informed of the importance of frequent follow up visits to maximize her success with intensive lifestyle modifications for her multiple health conditions.   ATTESTASTION STATEMENTS:  Reviewed by clinician on day of visit: allergies, medications, problem list, medical history, surgical history, family history, social history, and previous encounter notes pertinent to obesity diagnosis.   I have personally spent 31 minutes total time today in preparation, patient care, nutritional counseling and education,  and documentation for this visit, including the following: review of most recent clinical lab tests, prescribing medications/ refilling medications, reviewing medical assistant documentation, review and interpretation of bioimpedence results.     Robin Bender, D.O. DABFM, DABOM [facility] [address] [place], [place] [address] [phone]

## 2024-07-25 NOTE — Patient Instructions (Addendum)
 Change Metformin  XR to 1000 mg once daily with dinner  Restart Wellbutrin  once daily  Check out clear protein powders like Clean Simple Eats or Seeq  Aim for 100+ g of protein daily  Keep high sugar foods and drinks out of you diet Hydrate well with water  Follow up Dr Javaid for sleep study/ allergy testing  Kevin's meals/ [employer] foods for low carb options

## 2024-09-03 ENCOUNTER — Encounter: Payer: Self-pay | Admitting: Family Medicine

## 2024-09-03 ENCOUNTER — Ambulatory Visit (INDEPENDENT_AMBULATORY_CARE_PROVIDER_SITE_OTHER): Admitting: Family Medicine

## 2024-09-03 VITALS — BP 98/68 | HR 88 | Temp 98.0°F | Ht 64.0 in | Wt 292.0 lb

## 2024-09-03 DIAGNOSIS — F329 Major depressive disorder, single episode, unspecified: Secondary | ICD-10-CM | POA: Diagnosis not present

## 2024-09-03 DIAGNOSIS — Z6841 Body Mass Index (BMI) 40.0 and over, adult: Secondary | ICD-10-CM

## 2024-09-03 DIAGNOSIS — G4733 Obstructive sleep apnea (adult) (pediatric): Secondary | ICD-10-CM

## 2024-09-03 DIAGNOSIS — L659 Nonscarring hair loss, unspecified: Secondary | ICD-10-CM

## 2024-09-03 DIAGNOSIS — E88819 Insulin resistance, unspecified: Secondary | ICD-10-CM

## 2024-09-03 DIAGNOSIS — R7303 Prediabetes: Secondary | ICD-10-CM | POA: Diagnosis not present

## 2024-09-03 DIAGNOSIS — E66813 Obesity, class 3: Secondary | ICD-10-CM

## 2024-09-03 MED ORDER — METFORMIN HCL ER 500 MG PO TB24: ORAL_TABLET | ORAL | 1 refills | Status: AC

## 2024-09-03 MED ORDER — BUPROPION HCL ER (XL) 150 MG PO TB24: 150.0000 mg | ORAL_TABLET | Freq: Every day | ORAL | 1 refills | Status: AC

## 2024-09-03 NOTE — Progress Notes (Signed)
 Office: [phone]  /  Fax: [phone]  WEIGHT SUMMARY AND BIOMETRICS  Starting Date: 01/16/24  Starting Weight: 314lb   Weight Lost Since Last Visit: 1lb   Vitals Temp: 98 F (36.7 C) BP: 98/68 Pulse Rate: 88 SpO2: 93 %   Body Composition  Body Fat %: 53.9 % Fat Mass (lbs): 157.6 lbs Muscle Mass (lbs): 128 lbs Total Body Water (lbs): 99.8 lbs Visceral Fat Rating : 19    HPI  Chief Complaint: OBESITY  Robin Bender is here to discuss her progress with her obesity treatment plan. She is on the the Category 3 Plan and states she is following her eating plan approximately 30 % of the time. She states she is exercising 30-60 minutes 2 times per week.  Interval History:  Since last office visit she is down 1 pound She is up 0.2 pounds of muscle mass and down 1.6 pounds of body fat since last visit This gives her net weight loss of 22 pounds past 7 months of medically supervised weight management That is a 7% total body weight loss She has struggled to see additional weight loss after coming off Wegovy  in August She has started on her pathway towards bariatric surgery with Dr. Dela She has completed initial visits with nutrition, behavioral health, physical therapy and Dr. Dela with notes reviewed today She has been to see [facility] and will be starting on BiPAP therapy for moderate OSA with an apnea-hypopnea index of 24.9 In review of her chart, she was treated with a Medrol  Dosepak on 10/9 by her PCP Duwaine Cain, NP. She plans to do both gym and home exercises, limited currently by chronic back pain, knee and hip pain Obesity related comorbidities also includes IIH Risk factors for surgery include a history of multiple abdominal surgeries including total abdominal hysterectomy, BTL, LTCS, left ovarian cyst removal, appendectomy and umbilical hernia repair and chronic pain syndrome on oxycodone  She is planning to have her surgery by  January  Pharmacotherapy: Metformin  XR 1000 mg once daily Wellbutrin  XL 150 mg once daily  PHYSICAL EXAM:  Blood pressure 98/68, pulse 88, temperature 98 F (36.7 C), height 5' 4 (1.626 m), weight 292 lb (132.5 kg), SpO2 93%. Body mass index is 50.12 kg/m.  General: She is overweight, cooperative, alert, well developed, and in no acute distress. PSYCH: Has normal mood, affect and thought process.   Lungs: Normal breathing effort, no conversational dyspnea. Here with her mom  ASSESSMENT AND PLAN  TREATMENT PLAN FOR OBESITY:  Recommended Dietary Goals  Robin Bender is currently in the action stage of change. As such, her goal is to continue weight management plan. She has agreed to following a lower carbohydrate, vegetable and lean protein rich diet plan. Continue to work on dietary change goals outlined by RD in preparation for bariatric surgery Reviewed protein products  Behavioral Intervention  We discussed the following Behavioral Modification Strategies today: increasing lean protein intake to established goals, increasing fiber rich foods, avoiding skipping meals, increasing water intake , keeping healthy foods at home, identifying sources and decreasing liquid calories, avoiding temptations and identifying enticing environmental cues, and continue to practice mindfulness when eating. Cut down on soda to 0  Additional resources provided today: NA  Recommended Physical Activity Goals  Robin Bender has been advised to work up to 150 minutes of moderate intensity aerobic activity a week and strengthening exercises 2-3 times per week for cardiovascular health, weight loss maintenance and preservation of muscle mass.   She  has agreed to Increase the intensity, frequency or duration of aerobic exercises   Begin getting used to 30+ minutes of either gym or home exercises at least 3 days a week  Pharmacotherapy changes for the treatment of obesity: None  ASSOCIATED CONDITIONS ADDRESSED  TODAY  Prediabetes Lab Results  Component Value Date   HGBA1C 5.6 06/25/2024  Continue metformin  XR 1000 mg once daily for insulin  resistance and prediabetes.  Anticipate being able to discontinue this once she has bariatric surgery  Insulin  resistance Continue plan as stated above.  Reduce soda intake to 0 Increase regular exercise to include both cardio and resistance training at least 3 days a week  Reactive depression Stable.  Continue outside counseling.  Reminded her to take her Wellbutrin  as prescribed 150 mg each day as she has been fairly noncompliant taking  Class 3 severe obesity due to excess calories with serious comorbidity and body mass index (BMI) of 50.0 to 59.9 in adult [facility]) Continue on pathway towards bariatric surgery Notably, she is of at least 3 months out from corticosteroid use for surgery Continue work on healthy diet and exercise changes as reviewed by [facility]  OSA on CPAP Reviewed notes from [facility] dated 10/17 by Hoy Arm, NP.  She had findings of moderate OSA and will be starting BiPAP therapy with oxygen at night.  Look for improvements in daytime somnolence.  Hair loss New.  She reports increased hair loss over the past several months.  Advised her to start tracking her daily protein intake with a goal of 100 to 110 g/day.  Go ahead and start a new women's multivitamin daily.     She was informed of the importance of frequent follow up visits to maximize her success with intensive lifestyle modifications for her multiple health conditions.   ATTESTASTION STATEMENTS:  Reviewed by clinician on day of visit: allergies, medications, problem list, medical history, surgical history, family history, social history, and previous encounter notes pertinent to obesity diagnosis.   I have personally spent 30 minutes total time today in preparation, patient care, nutritional counseling and education,  and documentation for  this visit, including the following: review of most recent clinical lab tests, prescribing medications/ refilling medications, reviewing medical assistant documentation, review and interpretation of bioimpedence results.     [name], D.O. DABFM, DABOM [facility] [address] [place], [place] [address] [phone]

## 2024-09-03 NOTE — Patient Instructions (Addendum)
 Begin a women's Multivitamin Daily  Track daily intake of protein Aim for 100-110 g of protein intake daily  Keep trying out protein shakes Check out Clean Simple Eats website  Plan for : Start using CPAP nightly (with O2)  Stay on Metformin  until surgery  Start:  Home exercise or gym exercise 30+ min 3 days/ wk

## 2024-10-23 ENCOUNTER — Ambulatory Visit: Admitting: Family Medicine

## 2024-11-13 ENCOUNTER — Ambulatory Visit: Admitting: Family Medicine

## 2025-01-14 ENCOUNTER — Ambulatory Visit: Admitting: Nurse Practitioner
